# Patient Record
Sex: Female | Born: 1960 | Race: White | Hispanic: No | Marital: Married | Smoking: Former smoker
Health system: Southern US, Community
[De-identification: ages and names within clinical notes are randomized; demographics above are authoritative.]

## PROBLEM LIST (undated history)

## (undated) DIAGNOSIS — N898 Other specified noninflammatory disorders of vagina: Secondary | ICD-10-CM

## (undated) DIAGNOSIS — M1990 Unspecified osteoarthritis, unspecified site: Secondary | ICD-10-CM

## (undated) DIAGNOSIS — R011 Cardiac murmur, unspecified: Secondary | ICD-10-CM

## (undated) DIAGNOSIS — N809 Endometriosis, unspecified: Secondary | ICD-10-CM

## (undated) DIAGNOSIS — K219 Gastro-esophageal reflux disease without esophagitis: Secondary | ICD-10-CM

## (undated) DIAGNOSIS — J45909 Unspecified asthma, uncomplicated: Secondary | ICD-10-CM

## (undated) DIAGNOSIS — R5382 Chronic fatigue, unspecified: Secondary | ICD-10-CM

## (undated) DIAGNOSIS — D649 Anemia, unspecified: Secondary | ICD-10-CM

## (undated) DIAGNOSIS — F419 Anxiety disorder, unspecified: Secondary | ICD-10-CM

## (undated) DIAGNOSIS — K589 Irritable bowel syndrome without diarrhea: Secondary | ICD-10-CM

## (undated) DIAGNOSIS — Z8262 Family history of osteoporosis: Secondary | ICD-10-CM

## (undated) DIAGNOSIS — E119 Type 2 diabetes mellitus without complications: Secondary | ICD-10-CM

## (undated) DIAGNOSIS — Z87442 Personal history of urinary calculi: Secondary | ICD-10-CM

## (undated) DIAGNOSIS — I10 Essential (primary) hypertension: Secondary | ICD-10-CM

## (undated) DIAGNOSIS — E669 Obesity, unspecified: Secondary | ICD-10-CM

## (undated) DIAGNOSIS — E8940 Asymptomatic postprocedural ovarian failure: Secondary | ICD-10-CM

## (undated) HISTORY — DX: Essential (primary) hypertension: I10

## (undated) HISTORY — DX: Other specified noninflammatory disorders of vagina: N89.8

## (undated) HISTORY — DX: Family history of osteoporosis: Z82.62

## (undated) HISTORY — DX: Irritable bowel syndrome, unspecified: K58.9

## (undated) HISTORY — DX: Asymptomatic postprocedural ovarian failure: E89.40

## (undated) HISTORY — DX: Obesity, unspecified: E66.9

## (undated) HISTORY — PX: ABDOMINAL HYSTERECTOMY: SHX81

## (undated) HISTORY — DX: Chronic fatigue, unspecified: R53.82

## (undated) HISTORY — DX: Endometriosis, unspecified: N80.9

---
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
## 1998-08-07 ENCOUNTER — Other Ambulatory Visit: Admission: RE | Admit: 1998-08-07 | Discharge: 1998-08-07 | Payer: Self-pay | Admitting: *Deleted

## 1998-12-29 ENCOUNTER — Encounter: Payer: Self-pay | Admitting: Obstetrics and Gynecology

## 1998-12-29 ENCOUNTER — Inpatient Hospital Stay (HOSPITAL_COMMUNITY): Admission: AD | Admit: 1998-12-29 | Discharge: 1998-12-29 | Payer: Self-pay | Admitting: Obstetrics and Gynecology

## 1999-01-24 ENCOUNTER — Inpatient Hospital Stay (HOSPITAL_COMMUNITY): Admission: AD | Admit: 1999-01-24 | Discharge: 1999-01-26 | Payer: Self-pay | Admitting: Obstetrics and Gynecology

## 2003-08-30 ENCOUNTER — Other Ambulatory Visit: Admission: RE | Admit: 2003-08-30 | Discharge: 2003-08-30 | Payer: Self-pay | Admitting: Obstetrics and Gynecology

## 2005-02-26 ENCOUNTER — Ambulatory Visit: Payer: Self-pay | Admitting: Internal Medicine

## 2005-06-17 ENCOUNTER — Ambulatory Visit: Payer: Self-pay | Admitting: Obstetrics and Gynecology

## 2005-07-31 ENCOUNTER — Other Ambulatory Visit: Payer: Self-pay

## 2005-08-09 ENCOUNTER — Emergency Department: Payer: Self-pay | Admitting: General Practice

## 2005-08-09 ENCOUNTER — Inpatient Hospital Stay: Payer: Self-pay | Admitting: Obstetrics and Gynecology

## 2006-04-16 ENCOUNTER — Ambulatory Visit: Payer: Self-pay | Admitting: Obstetrics and Gynecology

## 2007-04-21 ENCOUNTER — Ambulatory Visit: Payer: Self-pay | Admitting: Obstetrics and Gynecology

## 2008-04-21 ENCOUNTER — Ambulatory Visit: Payer: Self-pay | Admitting: Obstetrics and Gynecology

## 2010-10-04 ENCOUNTER — Ambulatory Visit: Payer: Self-pay | Admitting: Obstetrics and Gynecology

## 2012-04-28 ENCOUNTER — Ambulatory Visit: Payer: Self-pay | Admitting: Obstetrics and Gynecology

## 2013-04-29 ENCOUNTER — Ambulatory Visit: Payer: Self-pay | Admitting: Obstetrics and Gynecology

## 2013-06-21 ENCOUNTER — Encounter: Payer: Self-pay | Admitting: Internal Medicine

## 2013-07-26 ENCOUNTER — Ambulatory Visit: Payer: Self-pay | Admitting: Internal Medicine

## 2013-09-07 ENCOUNTER — Ambulatory Visit: Payer: Self-pay | Admitting: Internal Medicine

## 2013-10-19 ENCOUNTER — Ambulatory Visit: Payer: Self-pay | Admitting: Internal Medicine

## 2013-11-30 ENCOUNTER — Ambulatory Visit: Payer: Self-pay | Admitting: Internal Medicine

## 2014-01-27 ENCOUNTER — Ambulatory Visit: Payer: Self-pay | Admitting: Internal Medicine

## 2014-07-13 LAB — HM PAP SMEAR: HM Pap smear: NEGATIVE

## 2014-08-09 ENCOUNTER — Ambulatory Visit: Payer: Self-pay | Admitting: Internal Medicine

## 2014-08-09 LAB — HM MAMMOGRAPHY

## 2014-09-28 DIAGNOSIS — M170 Bilateral primary osteoarthritis of knee: Secondary | ICD-10-CM | POA: Insufficient documentation

## 2015-04-06 ENCOUNTER — Telehealth: Payer: Self-pay | Admitting: *Deleted

## 2015-04-06 ENCOUNTER — Encounter: Payer: Self-pay | Admitting: Podiatry

## 2015-04-06 ENCOUNTER — Ambulatory Visit (INDEPENDENT_AMBULATORY_CARE_PROVIDER_SITE_OTHER): Payer: BC Managed Care – PPO

## 2015-04-06 ENCOUNTER — Ambulatory Visit (INDEPENDENT_AMBULATORY_CARE_PROVIDER_SITE_OTHER): Payer: BC Managed Care – PPO | Admitting: Podiatry

## 2015-04-06 VITALS — BP 153/88 | HR 63 | Resp 17

## 2015-04-06 DIAGNOSIS — M79672 Pain in left foot: Secondary | ICD-10-CM | POA: Diagnosis not present

## 2015-04-06 DIAGNOSIS — M722 Plantar fascial fibromatosis: Secondary | ICD-10-CM

## 2015-04-06 DIAGNOSIS — M779 Enthesopathy, unspecified: Secondary | ICD-10-CM | POA: Diagnosis not present

## 2015-04-06 NOTE — Telephone Encounter (Signed)
Pt has a question about her foot.

## 2015-04-06 NOTE — Telephone Encounter (Signed)
Patient stated she was seen in office already doesn't need to talk she was seen in the [place] office today

## 2015-04-06 NOTE — Progress Notes (Signed)
   Subjective:    Patient ID: Latasha [name], female    DOB: 08-Nov-1960, 54 y.o.   MRN: [id number]  HPI 54 year old female presents the office today with concerns of left foot pain which started this morning when she will cut. She states that the majority the bed last night she was having discomfort however this when she got up she had pain with straight up with her foot on the ground. She denies any history of injury or trauma. She does that yesterday she did do quite a bit of walking wearing a sandal. She did take Voltaren the swelling which seem to help alleviate some of her symptoms. She is also been soaking the foot in Epson salt soaks as well as icing today. She continues have some discomfort when she continues to walk. She denies any swelling or redness. Denies any numbness or tingling. No other complaints at this time.  Review of Systems  All other systems reviewed and are negative.      Objective:   Physical Exam AAO x3, NAD DP/PT pulses palpable bilaterally, CRT less than 3 seconds Protective sensation intact with Simms Weinstein monofilament, vibratory sensation intact, Achilles tendon reflex intact There is tenderness palpation along the plantar medial band of the plantar fascia just distal to the insertion of the calcaneus. There is mild discomfort along the medial arch of the foot as well. There is mild tenderness upon palpation of the navicular tuberosity. There is no tenderness on the course of the posterior tibial tendon other than at the tuberosity. Negative Tinel sign. There is no other areas of tenderness to bilateral lower extremities. There is no overall edema, erythema, increase in warmth. There is decrease in medial arch upon weightbearing. No areas of tenderness to bilateral lower extremities. MMT 5/5, ROM WNL.  No open lesions or pre-ulcerative lesions.  No overlying edema, erythema, increase in warmth to bilateral lower extremities.  No pain with calf compression,  swelling, warmth, erythema bilaterally.      Assessment & Plan:   54 year old female with likely recurrence of plantar fasciitis, tendinitis likely from yesterday's activity, no history of trauma/injury -X-rays were obtained and reviewed with the patient.  -Treatment options discussed including all alternatives, risks, and complications Patient elects to proceed with steroid injection into the left heel. Under sterile skin preparation, a total of 2.5cc of kenalog 10, 0.5% Marcaine plain, and 2% lidocaine plain were infiltrated into the symptomatic area without complication. A band-aid was applied. Patient tolerated the injection well without complication. Post-injection care with discussed with the patient. Discussed with the patient to ice the area over the next couple of days to help prevent a steroid flare.  -Dispensed ankle brace -Continue Voltaren -Discussed to wear supportive sneaker and all times. -Stretching exercises and ice -Follow-up 2 weeks or sooner if any problems arise. In the meantime, encouraged to call the office with any questions, concerns, change in symptoms.   Ovid Curd, DPM

## 2015-04-20 ENCOUNTER — Ambulatory Visit: Payer: BC Managed Care – PPO | Admitting: Podiatry

## 2015-07-18 ENCOUNTER — Encounter: Payer: Self-pay | Admitting: Obstetrics and Gynecology

## 2015-09-05 ENCOUNTER — Ambulatory Visit (INDEPENDENT_AMBULATORY_CARE_PROVIDER_SITE_OTHER): Payer: Self-pay | Admitting: Obstetrics and Gynecology

## 2015-09-05 ENCOUNTER — Encounter: Payer: Self-pay | Admitting: Obstetrics and Gynecology

## 2015-09-05 VITALS — BP 160/106 | HR 91 | Ht 66.0 in | Wt 231.0 lb

## 2015-09-05 DIAGNOSIS — K219 Gastro-esophageal reflux disease without esophagitis: Secondary | ICD-10-CM

## 2015-09-05 DIAGNOSIS — I10 Essential (primary) hypertension: Secondary | ICD-10-CM | POA: Insufficient documentation

## 2015-09-05 DIAGNOSIS — E66811 Obesity, class 1: Secondary | ICD-10-CM

## 2015-09-05 DIAGNOSIS — M810 Age-related osteoporosis without current pathological fracture: Secondary | ICD-10-CM | POA: Insufficient documentation

## 2015-09-05 DIAGNOSIS — Z1239 Encounter for other screening for malignant neoplasm of breast: Secondary | ICD-10-CM

## 2015-09-05 DIAGNOSIS — Z1211 Encounter for screening for malignant neoplasm of colon: Secondary | ICD-10-CM

## 2015-09-05 DIAGNOSIS — Z01419 Encounter for gynecological examination (general) (routine) without abnormal findings: Secondary | ICD-10-CM

## 2015-09-05 DIAGNOSIS — Z90722 Acquired absence of ovaries, bilateral: Secondary | ICD-10-CM

## 2015-09-05 DIAGNOSIS — Z90710 Acquired absence of both cervix and uterus: Secondary | ICD-10-CM

## 2015-09-05 DIAGNOSIS — E669 Obesity, unspecified: Secondary | ICD-10-CM

## 2015-09-05 DIAGNOSIS — E8940 Asymptomatic postprocedural ovarian failure: Secondary | ICD-10-CM | POA: Insufficient documentation

## 2015-09-05 DIAGNOSIS — Z9079 Acquired absence of other genital organ(s): Secondary | ICD-10-CM

## 2015-09-05 DIAGNOSIS — N809 Endometriosis, unspecified: Secondary | ICD-10-CM

## 2015-09-05 MED ORDER — ESTRADIOL 0.1 MG/GM VA CREA: 0.5000 g | TOPICAL_CREAM | VAGINAL | Status: DC

## 2015-09-05 NOTE — Progress Notes (Signed)
Patient ID: [name], female   DOB: March 02, 1961, 55 y.o.   MRN: [id number]

## 2015-09-05 NOTE — Patient Instructions (Signed)
1.  Pap smear is completed today. 2.  Mammogram is ordered. 3.  Stool guaiac card testing for colon cancer screen is ordered. 4.  Routine lab work is to be completed by Dr. Bethann PunchesMark Miller 5.  Follow-up with Dr. Bethann PunchesMark Miller for reassessment of blood pressure control soon. 6.  Encourage healthy eating with weight loss, with a goal of 1 pound per month weight loss over the next year. 7.  Estrace cream, intravaginal twice weekly for vaginal dryness is prescribed. 8.  Return 1 year.

## 2015-09-05 NOTE — Progress Notes (Signed)
Patient ID: Latasha ItoJulie Ann [name], female   DOB: Nov 18, 1960, 55 y.o.   MRN: [id number] GYN ANNUAL PREVENTATIVE CARE ENCOUNTER NOTE  Subjective:       Latasha ItoJulie Ann [name] is a 55 y.o. [phone] female here for a routine annual gynecologic exam.  Current complaints:  1.  Rare hot flash. 2.  Vaginal dryness. 3.  Loss of energy/fatigue. 4.  Weight gain.     Gynecologic History No LMP recorded. Contraception: status post TAH/BSO Last Pap: 07/13/2014 negative Last mammogram: 08/09/2014-BI-RADS 1 History of endometriosis, asymptomatic  Obstetric History OB History  Gravida Para Term Preterm AB SAB TAB Ectopic Multiple Living  2 2 2       2     # Outcome Date GA Lbr Len/2nd Weight Sex Delivery Anes PTL Lv  2 Term      Vag-Spont   Y  1 Term      Vag-Spont   Y      Past Medical History  Diagnosis Date  . Hypertension   . Obesity   . IBS (irritable bowel syndrome)   . Endometriosis   . Chronic fatigue   . Family history of osteoporosis   . Vaginal dryness   . Surgical menopause     Past Surgical History  Procedure Laterality Date  . Abdominal hysterectomy      tah/bso    Current Outpatient Prescriptions on File Prior to Visit  Medication Sig Dispense Refill  . acetaminophen (TYLENOL) 500 MG tablet Take by mouth.    . bisoprolol-hydrochlorothiazide (ZIAC) 10-6.25 MG per tablet TAKE ONE TABLET BY MOUTH ONCE DAILY    . diclofenac (VOLTAREN) 75 MG EC tablet Take by mouth.    . furosemide (LASIX) 20 MG tablet Take by mouth.    . Multiple Vitamin (MULTI-VITAMINS) TABS Take by mouth.    . pantoprazole (PROTONIX) 40 MG tablet TAKE ONE TABLET BY MOUTH ONCE DAILY IN THE MORNING AND IN THE EVENING    . potassium chloride SA (K-DUR,KLOR-CON) 20 MEQ tablet Take by mouth.     No current facility-administered medications on file prior to visit.    Allergies  Allergen Reactions  . Moxifloxacin Hcl In Nacl Rash    Does not remember - onset 01/15/2010    Social History   Social History  .  Marital Status: Married    Spouse Name: N/A  . Number of Children: N/A  . Years of Education: N/A   Occupational History  . Not on file.   Social History Main Topics  . Smoking status: Never Smoker   . Smokeless tobacco: Never Used  . Alcohol Use: 0.0 oz/week    0 Standard drinks or equivalent per week     Comment: occas  . Drug Use: No  . Sexual Activity: Yes    Birth Control/ Protection: Surgical   Other Topics Concern  . Not on file   Social History Narrative    Family History  Problem Relation Age of Onset  . Hypertension Mother   . Osteoporosis Mother   . Hypertension Father   . Diabetes Sister   . Hypertension Sister   . Diabetes Maternal Grandmother   . Hypertension Maternal Grandmother   . Cancer Neg Hx     The following portions of the patient's history were reviewed and updated as appropriate: allergies, current medications, past family history, past medical history, past social history, past surgical history and problem list.  Review of Systems Review of Systems  Constitutional: Positive for malaise/fatigue.  Rare hot flash  HENT: Negative.   Eyes: Negative.   Respiratory: Negative.   Cardiovascular: Negative.   Gastrointestinal: Negative.   Genitourinary: Negative.        Occasional SUI, not requiring pad use  Musculoskeletal: Negative.   Skin: Negative.   Neurological: Negative.   Endo/Heme/Allergies: Negative.   Psychiatric/Behavioral: Negative.      Objective:  BP 160/106 mmHg  Pulse 91  Ht 5\' 6"  (1.676 m)  Wt 231 lb (104.781 kg)  BMI 37.30 kg/m2  LMP  (LMP Unknown)  Physical Exam  Constitutional: She is oriented to person, place, and time. She appears well-developed and well-nourished.  HENT:  Head: Normocephalic and atraumatic.  Mouth/Throat: Oropharynx is clear and moist.  Eyes: Conjunctivae and EOM are normal. Pupils are equal, round, and reactive to light.  Neck: Normal range of motion. Neck supple.  Cardiovascular:  Normal rate and regular rhythm.   No murmur heard. Pulmonary/Chest: Breath sounds normal.  Abdominal: Soft. Bowel sounds are normal. She exhibits no distension and no mass. There is no tenderness. No hernia.  Genitourinary: No vaginal discharge found.  External genitalia-normal BUS-normal Vagina-decreased estrogen effect. Cervix-surgically absent Uterus-surgically absent Adnexa-no masses or tenderness. Rectovaginal-normal sphincter tone,; no rectal masses  Musculoskeletal: She exhibits no edema or tenderness.  Lymphadenopathy:    She has no cervical adenopathy.  Neurological: She is alert and oriented to person, place, and time.  Skin: Skin is warm and dry.  Psychiatric: She has a normal mood and affect. Her behavior is normal.    Assessment:   Annual gynecologic examination 55 y.o. Contraception: menopause  Obesity 1 Chronic hypertension, Blood pressure elevated (patient not taking medication last several days) Surgical menopause, asymptomatic GERD Vaginal dryness    Plan:  Pap: Pap Co Test Mammogram: Ordered Labs: Primary care-Dr. Bethann Punches Routine preventative health maintenance measures emphasized: Diet/Weight control, Tobacco Cessation and Alcohol/Drug use Encourage calcium with vitamin D supplementation Patient encouraged to lose 1 pound a month.  Over the next 12 months Follow-up with Dr. Hyacinth Meeker soon for reassessment of blood pressure control Return to Clinic - 1 Year   Herold Harms, MD   Note: This dictation was prepared with Dragon dictation along with smaller phrase technology. Any transcriptional errors that result from this process are unintentional.

## 2015-09-07 ENCOUNTER — Ambulatory Visit
Admission: RE | Admit: 2015-09-07 | Discharge: 2015-09-07 | Disposition: A | Discharge status: Home / Self Care | Payer: BC Managed Care – PPO | Source: Ambulatory Visit | Attending: Obstetrics and Gynecology | Admitting: Obstetrics and Gynecology

## 2015-09-07 DIAGNOSIS — Z1231 Encounter for screening mammogram for malignant neoplasm of breast: Secondary | ICD-10-CM | POA: Insufficient documentation

## 2015-09-07 DIAGNOSIS — Z1239 Encounter for other screening for malignant neoplasm of breast: Secondary | ICD-10-CM

## 2015-09-08 LAB — PAP IG AND HPV HIGH-RISK
HPV, HIGH-RISK: NEGATIVE
PAP Smear Comment: 0

## 2015-10-02 ENCOUNTER — Other Ambulatory Visit: Payer: Self-pay | Admitting: Internal Medicine

## 2015-10-02 DIAGNOSIS — M5010 Cervical disc disorder with radiculopathy, unspecified cervical region: Secondary | ICD-10-CM

## 2015-10-18 ENCOUNTER — Ambulatory Visit: Payer: BC Managed Care – PPO

## 2016-03-01 DIAGNOSIS — I10 Essential (primary) hypertension: Secondary | ICD-10-CM | POA: Insufficient documentation

## 2016-03-01 DIAGNOSIS — B029 Zoster without complications: Secondary | ICD-10-CM | POA: Insufficient documentation

## 2016-09-09 NOTE — Progress Notes (Deleted)
Patient ID: Latasha [name], female   DOB: 1960/12/10, 56 y.o.   MRN: [id number] GYN ANNUAL PREVENTATIVE CARE ENCOUNTER NOTE  Subjective:       Latasha [name] is a 56 y.o. [phone] female here for a routine annual gynecologic exam.  Current complaints:    Gynecologic History No LMP recorded (lmp unknown). Patient has had a hysterectomy. Contraception: status post TAH/BSO Last Pap: 09/05/2015 neg/neg Last mammogram: 09/07/2015 birad 1 History of endometriosis, asymptomatic  Obstetric History OB History  Gravida Para Term Preterm AB Living  2 2 2     2   SAB TAB Ectopic Multiple Live Births          2    # Outcome Date GA Lbr Len/2nd Weight Sex Delivery Anes PTL Lv  2 Term      Vag-Spont   LIV  1 Term      Vag-Spont   LIV      Past Medical History:  Diagnosis Date  . Chronic fatigue   . Endometriosis   . Family history of osteoporosis   . Hypertension   . IBS (irritable bowel syndrome)   . Obesity   . Surgical menopause   . Vaginal dryness     Past Surgical History:  Procedure Laterality Date  . ABDOMINAL HYSTERECTOMY     tah/bso    Current Outpatient Prescriptions on File Prior to Visit  Medication Sig Dispense Refill  . acetaminophen (TYLENOL) 500 MG tablet Take by mouth.    . Ascorbic Acid (VITAMIN C) 1000 MG tablet Take 1,000 mg by mouth daily.    . bisoprolol-hydrochlorothiazide (ZIAC) 10-6.25 MG per tablet TAKE ONE TABLET BY MOUTH ONCE DAILY    . diclofenac (VOLTAREN) 75 MG EC tablet Take by mouth.    . estradiol (ESTRACE VAGINAL) 0.1 MG/GM vaginal cream Place 0.25 Applicatorfuls vaginally 3 (three) times a week. 42.5 g 12  . furosemide (LASIX) 20 MG tablet Take by mouth.    . Glucosamine 500 MG CAPS Take 1 capsule by mouth daily.    . Multiple Vitamin (MULTI-VITAMINS) TABS Take by mouth.    . pantoprazole (PROTONIX) 40 MG tablet TAKE ONE TABLET BY MOUTH ONCE DAILY IN THE MORNING AND IN THE EVENING    . potassium chloride SA (K-DUR,KLOR-CON) 20 MEQ tablet Take  by mouth.     No current facility-administered medications on file prior to visit.     Allergies  Allergen Reactions  . Moxifloxacin Hcl In Nacl Rash    Does not remember - onset 01/15/2010    Social History   Social History  . Marital status: Married    Spouse name: N/A  . Number of children: N/A  . Years of education: N/A   Occupational History  . Not on file.   Social History Main Topics  . Smoking status: Never Smoker  . Smokeless tobacco: Never Used  . Alcohol use 0.0 oz/week     Comment: occas  . Drug use: No  . Sexual activity: Yes    Birth control/ protection: Surgical   Other Topics Concern  . Not on file   Social History Narrative  . No narrative on file    Family History  Problem Relation Age of Onset  . Hypertension Mother   . Osteoporosis Mother   . Hypertension Father   . Diabetes Sister   . Hypertension Sister   . Diabetes Maternal Grandmother   . Hypertension Maternal Grandmother   . Cancer Neg Hx   .  Breast cancer Maternal Aunt     great aunt    The following portions of the patient's history were reviewed and updated as appropriate: allergies, current medications, past family history, past medical history, past social history, past surgical history and problem list.  Review of Systems Review of Systems  Constitutional: Positive for malaise/fatigue.       Rare hot flash  HENT: Negative.   Eyes: Negative.   Respiratory: Negative.   Cardiovascular: Negative.   Gastrointestinal: Negative.   Genitourinary: Negative.        Occasional SUI, not requiring pad use  Musculoskeletal: Negative.   Skin: Negative.   Neurological: Negative.   Endo/Heme/Allergies: Negative.   Psychiatric/Behavioral: Negative.      Objective:  LMP  (LMP Unknown)   Physical Exam  Constitutional: She is oriented to person, place, and time. She appears well-developed and well-nourished.  HENT:  Head: Normocephalic and atraumatic.  Mouth/Throat: Oropharynx is  clear and moist.  Eyes: Conjunctivae and EOM are normal. Pupils are equal, round, and reactive to light.  Neck: Normal range of motion. Neck supple.  Cardiovascular: Normal rate and regular rhythm.   No murmur heard. Pulmonary/Chest: Breath sounds normal.  Abdominal: Soft. Bowel sounds are normal. She exhibits no distension and no mass. There is no tenderness. No hernia.  Genitourinary: No vaginal discharge found.  Genitourinary Comments: External genitalia-normal BUS-normal Vagina-decreased estrogen effect. Cervix-surgically absent Uterus-surgically absent Adnexa-no masses or tenderness. Rectovaginal-normal sphincter tone,; no rectal masses  Musculoskeletal: She exhibits no edema or tenderness.  Lymphadenopathy:    She has no cervical adenopathy.  Neurological: She is alert and oriented to person, place, and time.  Skin: Skin is warm and dry.  Psychiatric: She has a normal mood and affect. Her behavior is normal.    Assessment:   Annual gynecologic examination 56 y.o. Contraception: menopause  Obesity 1 Chronic hypertension, Blood pressure elevated (patient not taking medication last several days) Surgical menopause, asymptomatic GERD Vaginal dryness    Plan:  Pap: Due 2020 Mammogram: Ordered Labs: Primary care-Dr. Bethann PunchesMark Jequan Shahin Routine preventative health maintenance measures emphasized: Diet/Weight control, Tobacco Cessation and Alcohol/Drug use Encourage calcium with vitamin D supplementation Patient encouraged to lose 1 pound a month.  Over the next 12 months Follow-up with Dr. Hyacinth MeekerMiller soon for reassessment of blood pressure control Return to Clinic - 1 Year   Latasha Destinerystal Charnice Zwilling, [place]   Note: This dictation was prepared with Dragon dictation along with smaller phrase technology. Any transcriptional errors that result from this process are unintentional.

## 2016-09-10 ENCOUNTER — Encounter: Payer: Self-pay | Admitting: Obstetrics and Gynecology

## 2016-10-08 ENCOUNTER — Encounter: Payer: BC Managed Care – PPO | Admitting: Obstetrics and Gynecology

## 2016-11-01 NOTE — Progress Notes (Deleted)
Patient ID: Latasha [name], female   DOB: 08-10-1961, 56 y.o.   MRN: [id number] GYN ANNUAL PREVENTATIVE CARE ENCOUNTER NOTE  Subjective:       Latasha [name] is a 56 y.o. [phone] female here for a routine annual gynecologic exam.  Current complaints:  1.    Gynecologic History No LMP recorded (lmp unknown). Patient has had a hysterectomy. Contraception: status post TAH/BSO Last Pap: 09/05/2015 negative Last mammogram: 09/08/2015-BI-RADS 1 History of endometriosis, asymptomatic  Obstetric History OB History  Gravida Para Term Preterm AB Living  2 2 2     2   SAB TAB Ectopic Multiple Live Births          2    # Outcome Date GA Lbr Len/2nd Weight Sex Delivery Anes PTL Lv  2 Term      Vag-Spont   LIV  1 Term      Vag-Spont   LIV      Past Medical History:  Diagnosis Date  . Chronic fatigue   . Endometriosis   . Family history of osteoporosis   . Hypertension   . IBS (irritable bowel syndrome)   . Obesity   . Surgical menopause   . Vaginal dryness     Past Surgical History:  Procedure Laterality Date  . ABDOMINAL HYSTERECTOMY     tah/bso    Current Outpatient Prescriptions on File Prior to Visit  Medication Sig Dispense Refill  . acetaminophen (TYLENOL) 500 MG tablet Take by mouth.    . Ascorbic Acid (VITAMIN C) 1000 MG tablet Take 1,000 mg by mouth daily.    . bisoprolol-hydrochlorothiazide (ZIAC) 10-6.25 MG per tablet TAKE ONE TABLET BY MOUTH ONCE DAILY    . diclofenac (VOLTAREN) 75 MG EC tablet Take by mouth.    . estradiol (ESTRACE VAGINAL) 0.1 MG/GM vaginal cream Place 0.25 Applicatorfuls vaginally 3 (three) times a week. 42.5 g 12  . furosemide (LASIX) 20 MG tablet Take by mouth.    . Glucosamine 500 MG CAPS Take 1 capsule by mouth daily.    . Multiple Vitamin (MULTI-VITAMINS) TABS Take by mouth.    . pantoprazole (PROTONIX) 40 MG tablet TAKE ONE TABLET BY MOUTH ONCE DAILY IN THE MORNING AND IN THE EVENING    . potassium chloride SA (K-DUR,KLOR-CON) 20 MEQ  tablet Take by mouth.     No current facility-administered medications on file prior to visit.     Allergies  Allergen Reactions  . Moxifloxacin Hcl In Nacl Rash    Does not remember - onset 01/15/2010    Social History   Social History  . Marital status: Married    Spouse name: N/A  . Number of children: N/A  . Years of education: N/A   Occupational History  . Not on file.   Social History Main Topics  . Smoking status: Never Smoker  . Smokeless tobacco: Never Used  . Alcohol use 0.0 oz/week     Comment: occas  . Drug use: No  . Sexual activity: Yes    Birth control/ protection: Surgical   Other Topics Concern  . Not on file   Social History Narrative  . No narrative on file    Family History  Problem Relation Age of Onset  . Hypertension Mother   . Osteoporosis Mother   . Hypertension Father   . Diabetes Sister   . Hypertension Sister   . Diabetes Maternal Grandmother   . Hypertension Maternal Grandmother   . Cancer Neg Hx   .  Breast cancer Maternal Aunt     great aunt    The following portions of the patient's history were reviewed and updated as appropriate: allergies, current medications, past family history, past medical history, past social history, past surgical history and problem list.  Review of Systems Review of Systems  Constitutional: Positive for malaise/fatigue.       Rare hot flash  HENT: Negative.   Eyes: Negative.   Respiratory: Negative.   Cardiovascular: Negative.   Gastrointestinal: Negative.   Genitourinary: Negative.        Occasional SUI, not requiring pad use  Musculoskeletal: Negative.   Skin: Negative.   Neurological: Negative.   Endo/Heme/Allergies: Negative.   Psychiatric/Behavioral: Negative.      Objective:  LMP  (LMP Unknown)   Physical Exam  Constitutional: She is oriented to person, place, and time. She appears well-developed and well-nourished.  HENT:  Head: Normocephalic and atraumatic.  Mouth/Throat:  Oropharynx is clear and moist.  Eyes: Conjunctivae and EOM are normal. Pupils are equal, round, and reactive to light.  Neck: Normal range of motion. Neck supple.  Cardiovascular: Normal rate and regular rhythm.   No murmur heard. Pulmonary/Chest: Breath sounds normal.  Abdominal: Soft. Bowel sounds are normal. She exhibits no distension and no mass. There is no tenderness. No hernia.  Genitourinary: No vaginal discharge found.  Genitourinary Comments: External genitalia-normal BUS-normal Vagina-decreased estrogen effect. Cervix-surgically absent Uterus-surgically absent Adnexa-no masses or tenderness. Rectovaginal-normal sphincter tone,; no rectal masses  Musculoskeletal: She exhibits no edema or tenderness.  Lymphadenopathy:    She has no cervical adenopathy.  Neurological: She is alert and oriented to person, place, and time.  Skin: Skin is warm and dry.  Psychiatric: She has a normal mood and affect. Her behavior is normal.    Assessment:   Annual gynecologic examination 56 y.o. Contraception: menopause  Obesity 1 Chronic hypertension, Blood pressure elevated (patient not taking medication last several days) Surgical menopause, asymptomatic GERD Vaginal dryness    Plan:  Pap: Due 2020 Mammogram: Ordered Labs: Primary care-Dr. Bethann Punches Routine preventative health maintenance measures emphasized: Diet/Weight control, Tobacco Cessation and Alcohol/Drug use Encourage calcium with vitamin D supplementation Patient encouraged to lose 1 pound a month.  Over the next 12 months Return to Clinic - 1 Year   Darol Destine, [place]   Note: This dictation was prepared with Dragon dictation along with smaller phrase technology. Any transcriptional errors that result from this process are unintentional.

## 2016-11-07 ENCOUNTER — Encounter: Payer: BC Managed Care – PPO | Admitting: Obstetrics and Gynecology

## 2016-11-28 IMAGING — MG MM DIGITAL SCREENING BILAT W/ TOMO W/ CAD
8 of 12 series · 8 of 28 positions shown · non-contrast
Comparison: Previous exam(s).

CLINICAL DATA: Screening.

EXAM:
DIGITAL SCREENING BILATERAL MAMMOGRAM WITH 3D TOMO WITH CAD

[L CC synth-2D]
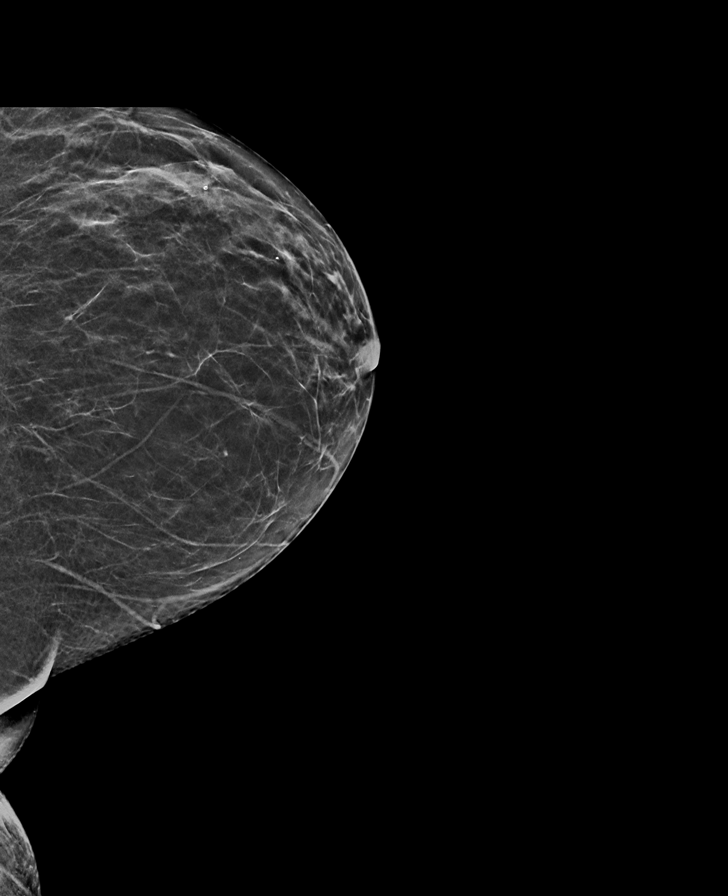

[R CC]
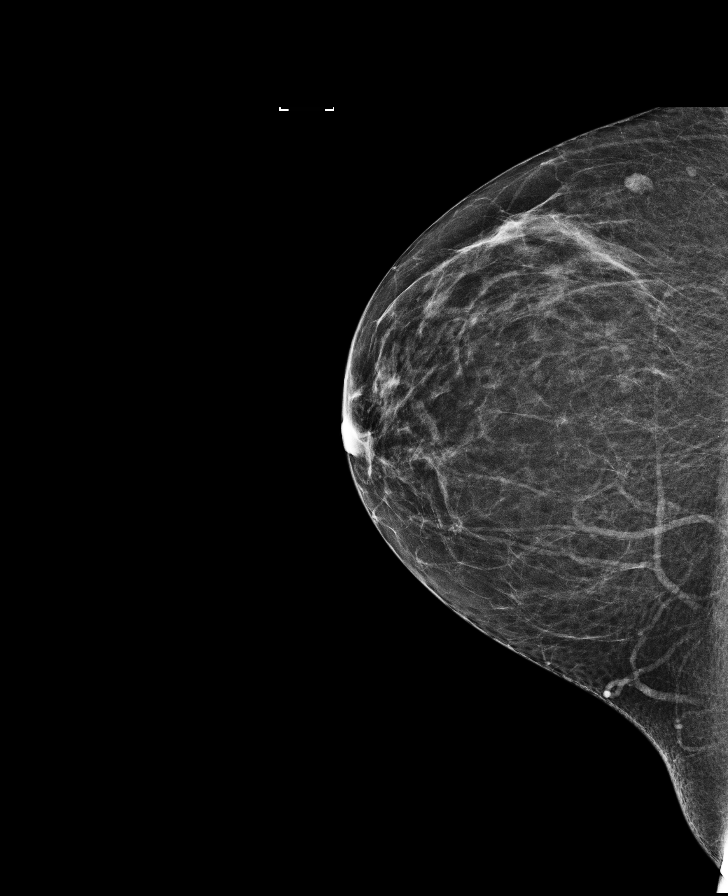

[R CC synth-2D]
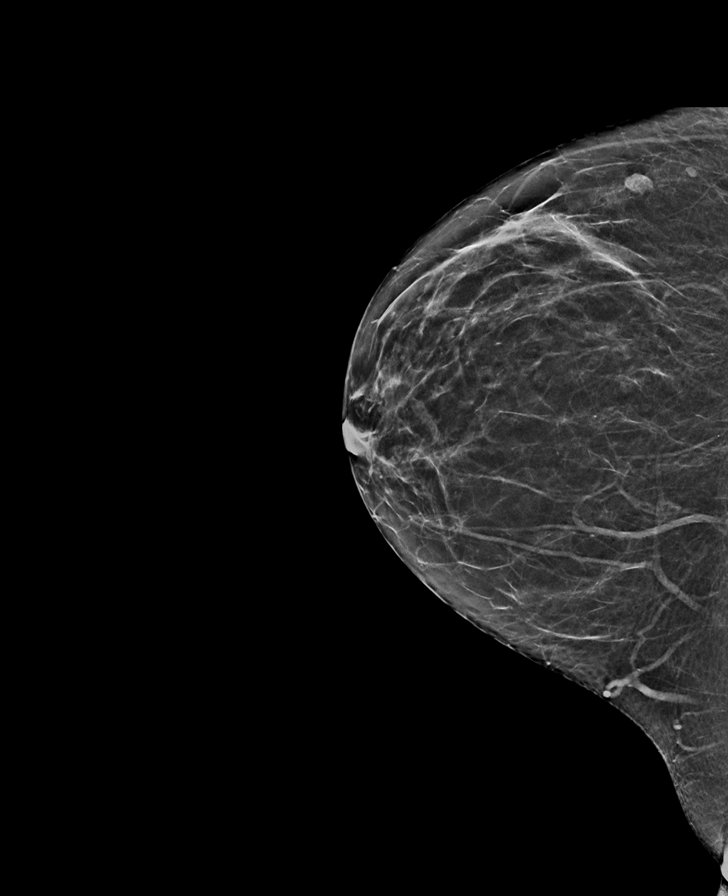

[L CC]
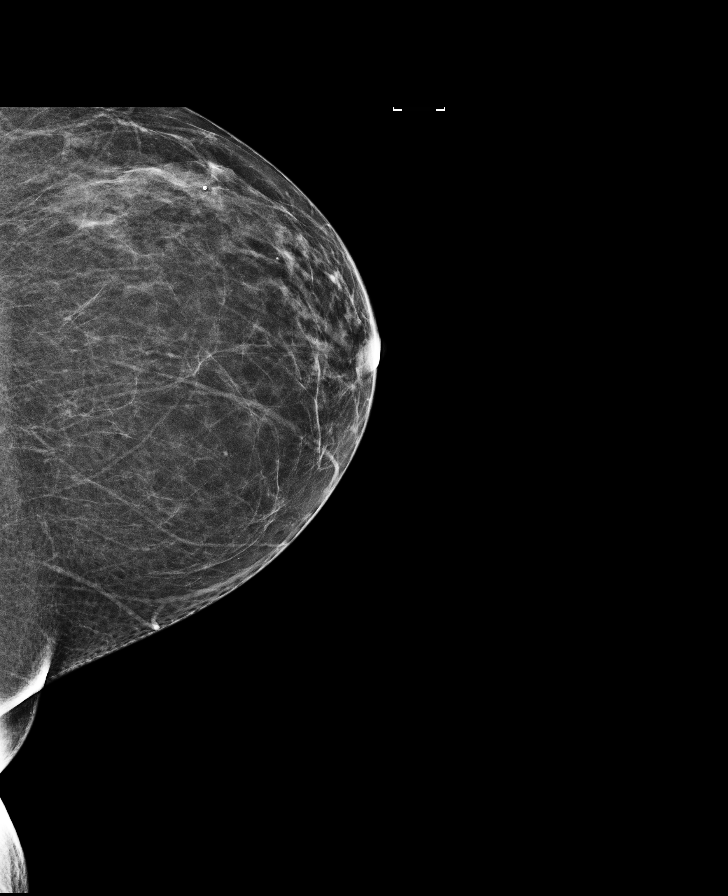

[L MLO synth-2D]
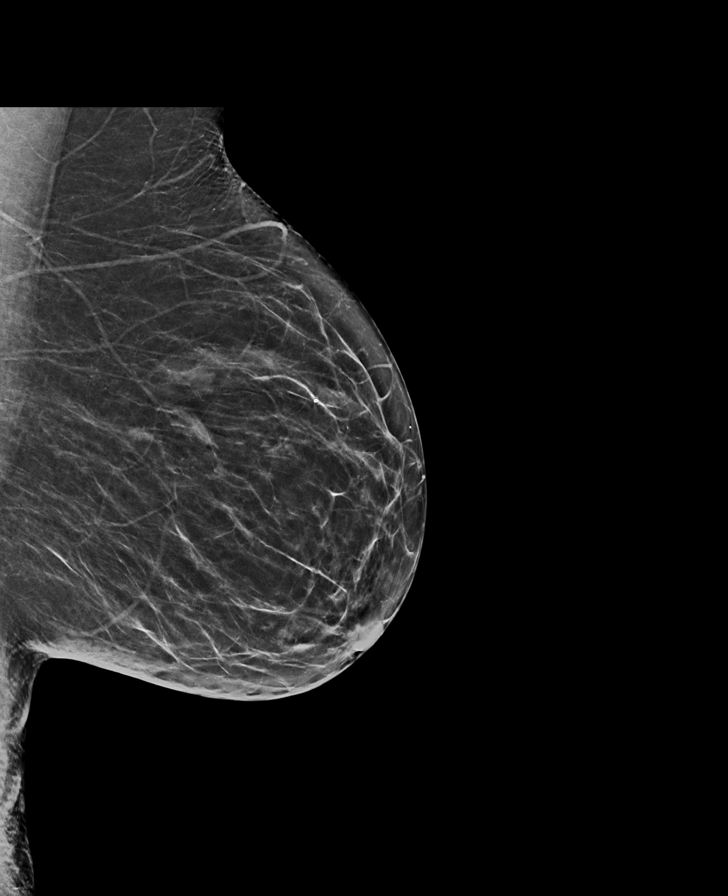

[L MLO]
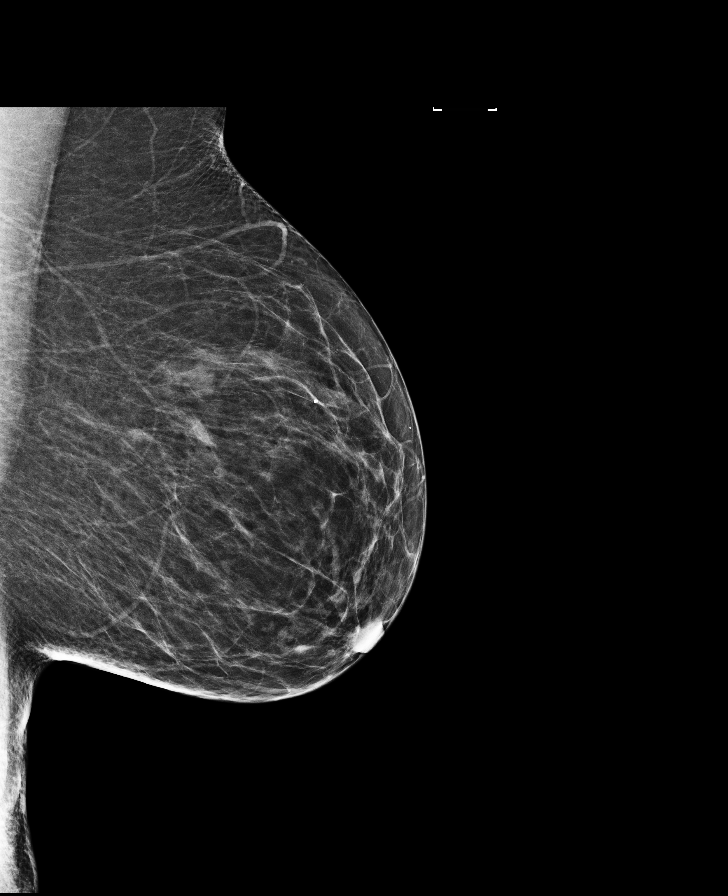

[R MLO synth-2D]
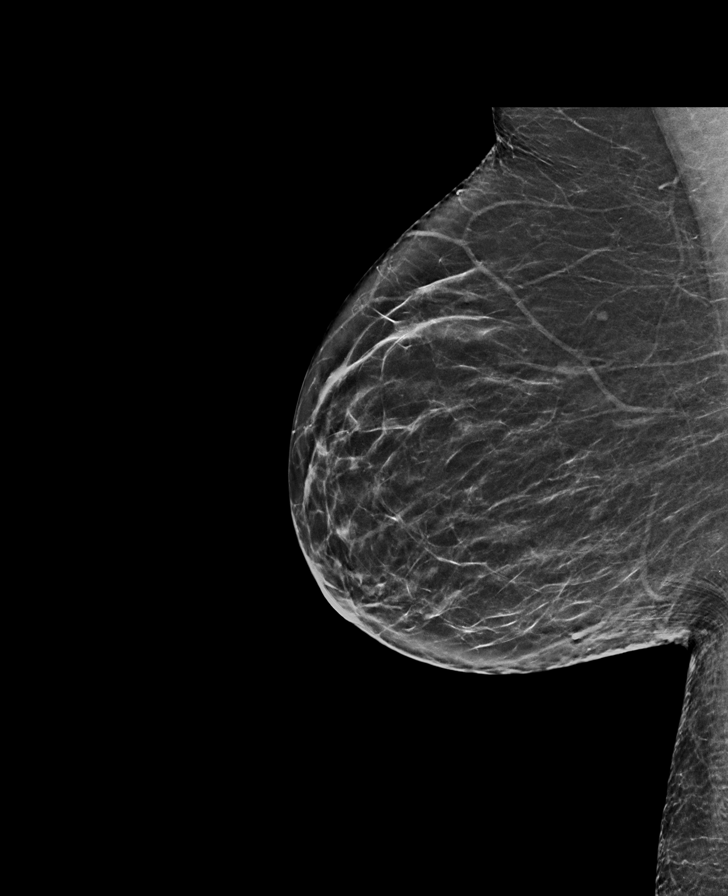

[R MLO]
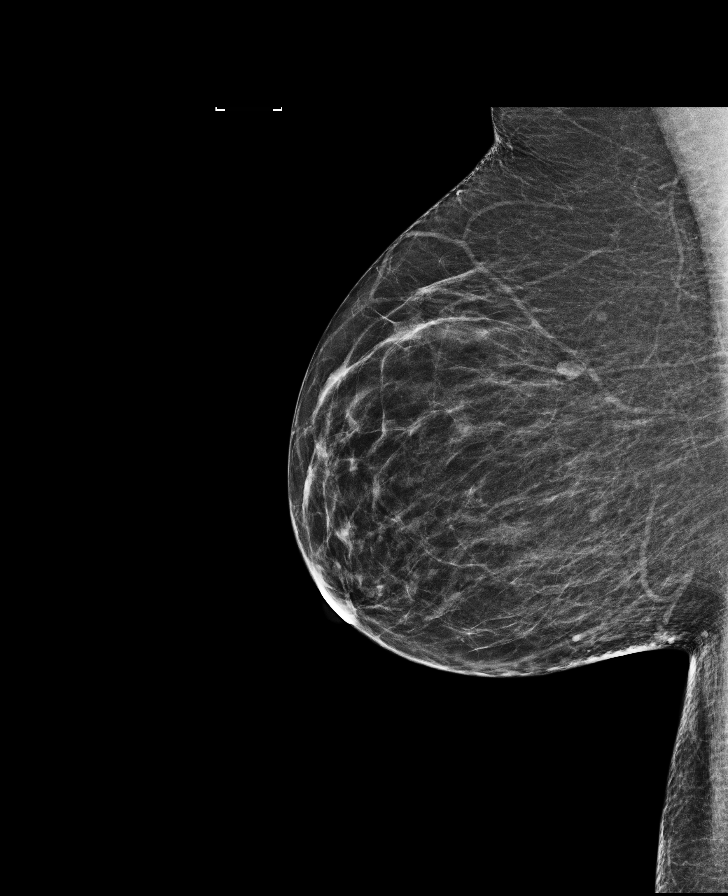

[8 of 28 positions shown; findings below may reference images not displayed]

ACR Breast Density Category b: There are scattered areas of
fibroglandular density.
FINDINGS: There are no findings suspicious for malignancy. Images were
processed with CAD.
IMPRESSION: No mammographic evidence of malignancy. A result letter of this
screening mammogram will be mailed directly to the patient.

RECOMMENDATION:
Screening mammogram in one year. (Code:55-L-23V)

BI-RADS CATEGORY  1: Negative.

## 2016-12-16 NOTE — Progress Notes (Signed)
Patient ID: Latasha [name], female   DOB: 06/20/1961, 56 y.o.   MRN: [id number]   Patient ID: Latasha [name], female   DOB: 1961-01-21, 56 y.o.   MRN: [id number] GYN ANNUAL PREVENTATIVE CARE ENCOUNTER NOTE  Subjective:       Latasha [name] is a 56 y.o. [phone] female here for a routine annual gynecologic exam.  Current complaints:  1.  none  Patient reports no major interval health issues. Bowel function is normal. Bladder function is notable for mild stress and urge symptoms; she is not desiring medical intervention Patient is not experiencing any significant vaginal atrophy symptoms; intercourse is not uncomfortable. Latasha [name] is not taking calcium with vitamin D. She is not exercising regularly due to arthritis knee problems. She is due to get colonoscopy this year.   Gynecologic History No LMP recorded (lmp unknown). Patient has had a hysterectomy. Contraception: status post TAH/BSO Last Pap: 09/05/2015  Negative/neg Last mammogram: 09/08/2015-BI-RADS 1 History of endometriosis, asymptomatic  Obstetric History OB History  Gravida Para Term Preterm AB Living  SAB TAB Ectopic Multiple Live Births          2    # Outcome Date GA Lbr Len/2nd Weight Sex Delivery Anes PTL Lv  2 Term      Vag-Spont   LIV  1 Term      Vag-Spont   LIV      Past Medical History:  Diagnosis Date  . Chronic fatigue   . Endometriosis   . Family history of osteoporosis   . Hypertension   . IBS (irritable bowel syndrome)   . Obesity   . Surgical menopause   . Vaginal dryness     Past Surgical History:  Procedure Laterality Date  . ABDOMINAL HYSTERECTOMY     tah/bso    Current Outpatient Prescriptions on File Prior to Visit  Medication Sig Dispense Refill  . acetaminophen (TYLENOL) 500 MG tablet Take by mouth.    . Ascorbic Acid (VITAMIN C) 1000 MG tablet Take 1,000 mg by mouth daily.    . bisoprolol-hydrochlorothiazide (ZIAC) 10-6.25 MG per tablet TAKE ONE TABLET BY MOUTH  ONCE DAILY    . diclofenac (VOLTAREN) 75 MG EC tablet Take by mouth.    . estradiol (ESTRACE VAGINAL) 0.1 MG/GM vaginal cream Place 0.25 Applicatorfuls vaginally 3 (three) times a week. 42.5 g 12  . furosemide (LASIX) 20 MG tablet Take by mouth.    . Glucosamine 500 MG CAPS Take 1 capsule by mouth daily.    . Multiple Vitamin (MULTI-VITAMINS) TABS Take by mouth.    . pantoprazole (PROTONIX) 40 MG tablet TAKE ONE TABLET BY MOUTH ONCE DAILY IN THE MORNING AND IN THE EVENING    . potassium chloride SA (K-DUR,KLOR-CON) 20 MEQ tablet Take by mouth.     No current facility-administered medications on file prior to visit.     Allergies  Allergen Reactions  . Moxifloxacin Hcl In Nacl Rash    Does not remember - onset 01/15/2010    Social History   Social History  . Marital status: Married    Spouse name: N/A  . Number of children: N/A  . Years of education: N/A   Occupational History  . Not on file.   Social History Main Topics  . Smoking status: Never Smoker  . Smokeless tobacco: Never Used  . Alcohol use 0.0 oz/week     Comment: occas  . Drug use: No  .  Sexual activity: Yes    Birth control/ protection: Surgical   Other Topics Concern  . Not on file   Social History Narrative  . No narrative on file    Family History  Problem Relation Age of Onset  . Hypertension Mother   . Osteoporosis Mother   . Hypertension Father   . Diabetes Sister   . Hypertension Sister   . Diabetes Maternal Grandmother   . Hypertension Maternal Grandmother   . Cancer Neg Hx   . Breast cancer Maternal Aunt     great aunt    The following portions of the patient's history were reviewed and updated as appropriate: allergies, current medications, past family history, past medical history, past social history, past surgical history and problem list.  Review of Systems Review of Systems  Constitutional: Positive for malaise/fatigue.       Rare hot flash  HENT: Negative.   Eyes: Negative.    Respiratory: Negative.   Cardiovascular: Negative.   Gastrointestinal: Negative.   Genitourinary: Negative.        Occasional SUI, not requiring pad use Urge symptoms are present  Musculoskeletal: Positive for joint pain.       Knee arthritis issues are preventing regular exercise  Skin: Negative.   Neurological: Negative.   Endo/Heme/Allergies: Negative.   Psychiatric/Behavioral: Negative.      Objective:  No LMP recorded (lmp unknown). Patient has had a hysterectomy. BP (!) 144/81   Pulse 80   Ht  (1.676 m)   Wt 219 lb 8 oz (99.6 kg)   LMP  (LMP Unknown)   BMI 35.43 kg/m    Physical Exam  Constitutional: She is oriented to person, place, and time. She appears well-developed and well-nourished.  HENT:  Head: Normocephalic and atraumatic.  Mouth/Throat: Oropharynx is clear and moist.  Eyes: Conjunctivae and EOM are normal. Pupils are equal, round, and reactive to light. No scleral icterus.  Neck: Normal range of motion. Neck supple. No thyromegaly present.  Cardiovascular: Normal rate and regular rhythm.   No murmur heard. Pulmonary/Chest: Breath sounds normal.  Abdominal: Soft. She exhibits no distension and no mass. There is no tenderness. No hernia.  Genitourinary: Vagina normal. No vaginal discharge found.  Genitourinary Comments: External genitalia-normal BUS-normal Vagina-decreased estrogen effect. Cervix-surgically absent Uterus-surgically absent Adnexa-no masses or tenderness. Rectovaginal-normal sphincter tone,; no rectal masses  Musculoskeletal: She exhibits no edema or tenderness.  Lymphadenopathy:    She has no cervical adenopathy.  Neurological: She is alert and oriented to person, place, and time.  Skin: Skin is warm and dry.  Psychiatric: She has a normal mood and affect. Her behavior is normal.    Assessment:   Annual gynecologic examination 56 y.o. Contraception: menopause  Obesity 1 Chronic hypertension, Surgical menopause,  asymptomatic GERD    Plan:  Pap:per pt request pap w/rflx Mammogram: Ordered Colonoscopy for 03/2017 Labs: Primary care-Dr. Bethann Punches Routine preventative health maintenance measures emphasized: Diet/Weight control, Tobacco Cessation and Alcohol/Drug use Encourage calcium with vitamin D supplementation Patient encouraged to lose 1 pound a month.  Over the next 12 months Return to Clinic - 1 [address] [place], [place]  Herold Harms, MD   Note: This dictation was prepared with Dragon dictation along with smaller phrase technology. Any transcriptional errors that result from this process are unintentional.

## 2016-12-18 ENCOUNTER — Ambulatory Visit (INDEPENDENT_AMBULATORY_CARE_PROVIDER_SITE_OTHER): Payer: BC Managed Care – PPO | Admitting: Obstetrics and Gynecology

## 2016-12-18 ENCOUNTER — Encounter: Payer: Self-pay | Admitting: Obstetrics and Gynecology

## 2016-12-18 VITALS — BP 144/81 | HR 80 | Ht 66.0 in | Wt 219.5 lb

## 2016-12-18 DIAGNOSIS — Z1211 Encounter for screening for malignant neoplasm of colon: Secondary | ICD-10-CM | POA: Diagnosis not present

## 2016-12-18 DIAGNOSIS — Z1231 Encounter for screening mammogram for malignant neoplasm of breast: Secondary | ICD-10-CM | POA: Diagnosis not present

## 2016-12-18 DIAGNOSIS — Z1239 Encounter for other screening for malignant neoplasm of breast: Secondary | ICD-10-CM

## 2016-12-18 DIAGNOSIS — Z01419 Encounter for gynecological examination (general) (routine) without abnormal findings: Secondary | ICD-10-CM | POA: Diagnosis not present

## 2016-12-18 DIAGNOSIS — N809 Endometriosis, unspecified: Secondary | ICD-10-CM | POA: Diagnosis not present

## 2016-12-18 NOTE — Patient Instructions (Signed)
1. Pap smear is done 2. Mammogram is ordered 3. Patient is to obtain screening colonoscopy this year 4. Patient is encouraged to take calcium with vitamin D supplementation daily 5. Screening labs are to be obtained through primary care 6. Return in 1 year for annual exam   Health Maintenance for Postmenopausal Women Menopause is a normal process in which your reproductive ability comes to an end. This process happens gradually over a span of months to years, usually between the ages of 11 and 24. Menopause is complete when you have missed 12 consecutive menstrual periods. It is important to talk with your health care provider about some of the most common conditions that affect postmenopausal women, such as heart disease, cancer, and bone loss (osteoporosis). Adopting a healthy lifestyle and getting preventive care can help to promote your health and wellness. Those actions can also lower your chances of developing some of these common conditions. What should I know about menopause? During menopause, you may experience a number of symptoms, such as:  Moderate-to-severe hot flashes.  Night sweats.  Decrease in sex drive.  Mood swings.  Headaches.  Tiredness.  Irritability.  Memory problems.  Insomnia. Choosing to treat or not to treat menopausal changes is an individual decision that you make with your health care provider. What should I know about hormone replacement therapy and supplements? Hormone therapy products are effective for treating symptoms that are associated with menopause, such as hot flashes and night sweats. Hormone replacement carries certain risks, especially as you become older. If you are thinking about using estrogen or estrogen with progestin treatments, discuss the benefits and risks with your health care provider. What should I know about heart disease and stroke? Heart disease, heart attack, and stroke become more likely as you age. This may be due, in  part, to the hormonal changes that your body experiences during menopause. These can affect how your body processes dietary fats, triglycerides, and cholesterol. Heart attack and stroke are both medical emergencies. There are many things that you can do to help prevent heart disease and stroke:  Have your blood pressure checked at least every 1-2 years. High blood pressure causes heart disease and increases the risk of stroke.  If you are 45-61 years old, ask your health care provider if you should take aspirin to prevent a heart attack or a stroke.  Do not use any tobacco products, including cigarettes, chewing tobacco, or electronic cigarettes. If you need help quitting, ask your health care provider.  It is important to eat a healthy diet and maintain a healthy weight.  Be sure to include plenty of vegetables, fruits, low-fat dairy products, and lean protein.  Avoid eating foods that are high in solid fats, added sugars, or salt (sodium).  Get regular exercise. This is one of the most important things that you can do for your health.  Try to exercise for at least 150 minutes each week. The type of exercise that you do should increase your heart rate and make you sweat. This is known as moderate-intensity exercise.  Try to do strengthening exercises at least twice each week. Do these in addition to the moderate-intensity exercise.  Know your numbers.Ask your health care provider to check your cholesterol and your blood glucose. Continue to have your blood tested as directed by your health care provider. What should I know about cancer screening? There are several types of cancer. Take the following steps to reduce your risk and to catch any cancer  development as early as possible. Breast Cancer  Practice breast self-awareness.  This means understanding how your breasts normally appear and feel.  It also means doing regular breast self-exams. Let your health care provider know about  any changes, no matter how small.  If you are 49 or older, have a clinician do a breast exam (clinical breast exam or CBE) every year. Depending on your age, family history, and medical history, it may be recommended that you also have a yearly breast X-ray (mammogram).  If you have a family history of breast cancer, talk with your health care provider about genetic screening.  If you are at high risk for breast cancer, talk with your health care provider about having an MRI and a mammogram every year.  Breast cancer (BRCA) gene test is recommended for women who have family members with BRCA-related cancers. Results of the assessment will determine the need for genetic counseling and BRCA1 and for BRCA2 testing. BRCA-related cancers include these types:  Breast. This occurs in males or females.  Ovarian.  Tubal. This may also be called fallopian tube cancer.  Cancer of the abdominal or pelvic lining (peritoneal cancer).  Prostate.  Pancreatic. Cervical, Uterine, and Ovarian Cancer  Your health care provider may recommend that you be screened regularly for cancer of the pelvic organs. These include your ovaries, uterus, and vagina. This screening involves a pelvic exam, which includes checking for microscopic changes to the surface of your cervix (Pap test).  For women ages 21-65, health care providers may recommend a pelvic exam and a Pap test every three years. For women ages 23-65, they may recommend the Pap test and pelvic exam, combined with testing for human papilloma virus (HPV), every five years. Some types of HPV increase your risk of cervical cancer. Testing for HPV may also be done on women of any age who have unclear Pap test results.  Other health care providers may not recommend any screening for nonpregnant women who are considered low risk for pelvic cancer and have no symptoms. Ask your health care provider if a screening pelvic exam is right for you.  If you have had past  treatment for cervical cancer or a condition that could lead to cancer, you need Pap tests and screening for cancer for at least 20 years after your treatment. If Pap tests have been discontinued for you, your risk factors (such as having a new sexual partner) need to be reassessed to determine if you should start having screenings again. Some women have medical problems that increase the chance of getting cervical cancer. In these cases, your health care provider may recommend that you have screening and Pap tests more often.  If you have a family history of uterine cancer or ovarian cancer, talk with your health care provider about genetic screening.  If you have vaginal bleeding after reaching menopause, tell your health care provider.  There are currently no reliable tests available to screen for ovarian cancer. Lung Cancer  Lung cancer screening is recommended for adults 62-22 years old who are at high risk for lung cancer because of a history of smoking. A yearly low-dose CT scan of the lungs is recommended if you:  Currently smoke.  Have a history of at least 30 pack-years of smoking and you currently smoke or have quit within the past 15 years. A pack-year is smoking an average of one pack of cigarettes per day for one year. Yearly screening should:  Continue until it has  been 15 years since you quit.  Stop if you develop a health problem that would prevent you from having lung cancer treatment. Colorectal Cancer  This type of cancer can be detected and can often be prevented.  Routine colorectal cancer screening usually begins at age 37 and continues through age 30.  If you have risk factors for colon cancer, your health care provider may recommend that you be screened at an earlier age.  If you have a family history of colorectal cancer, talk with your health care provider about genetic screening.  Your health care provider may also recommend using home test kits to check for  hidden blood in your stool.  A small camera at the end of a tube can be used to examine your colon directly (sigmoidoscopy or colonoscopy). This is done to check for the earliest forms of colorectal cancer.  Direct examination of the colon should be repeated every 5-10 years until age 58. However, if early forms of precancerous polyps or small growths are found or if you have a family history or genetic risk for colorectal cancer, you may need to be screened more often. Skin Cancer  Check your skin from head to toe regularly.  Monitor any moles. Be sure to tell your health care provider:  About any new moles or changes in moles, especially if there is a change in a mole's shape or color.  If you have a mole that is larger than the size of a pencil eraser.  If any of your family members has a history of skin cancer, especially at a young age, talk with your health care provider about genetic screening.  Always use sunscreen. Apply sunscreen liberally and repeatedly throughout the day.  Whenever you are outside, protect yourself by wearing long sleeves, pants, a wide-brimmed hat, and sunglasses. What should I know about osteoporosis? Osteoporosis is a condition in which bone destruction happens more quickly than new bone creation. After menopause, you may be at an increased risk for osteoporosis. To help prevent osteoporosis or the bone fractures that can happen because of osteoporosis, the following is recommended:  If you are 75-101 years old, get at least 1,000 mg of calcium and at least 600 mg of vitamin D per day.  If you are older than age 8 but younger than age 56, get at least 1,200 mg of calcium and at least 600 mg of vitamin D per day.  If you are older than age 73, get at least 1,200 mg of calcium and at least 800 mg of vitamin D per day. Smoking and excessive alcohol intake increase the risk of osteoporosis. Eat foods that are rich in calcium and vitamin D, and do weight-bearing  exercises several times each week as directed by your health care provider. What should I know about how menopause affects my mental health? Depression may occur at any age, but it is more common as you become older. Common symptoms of depression include:  Low or sad mood.  Changes in sleep patterns.  Changes in appetite or eating patterns.  Feeling an overall lack of motivation or enjoyment of activities that you previously enjoyed.  Frequent crying spells. Talk with your health care provider if you think that you are experiencing depression. What should I know about immunizations? It is important that you get and maintain your immunizations. These include:  Tetanus, diphtheria, and pertussis (Tdap) booster vaccine.  Influenza every year before the flu season begins.  Pneumonia vaccine.  Shingles vaccine.  Your health care provider may also recommend other immunizations. This information is not intended to replace advice given to you by your health care provider. Make sure you discuss any questions you have with your health care provider. Document Released: 10/11/2005 Document Revised: 03/08/2016 Document Reviewed: 05/23/2015 Elsevier Interactive Patient Education  2017 [employer].

## 2016-12-19 LAB — PAP IG W/ RFLX HPV ASCU: PAP SMEAR COMMENT: 0

## 2017-01-09 ENCOUNTER — Ambulatory Visit
Admission: RE | Admit: 2017-01-09 | Discharge: 2017-01-09 | Disposition: A | Discharge status: Home / Self Care | Payer: BC Managed Care – PPO | Source: Ambulatory Visit | Attending: Obstetrics and Gynecology | Admitting: Obstetrics and Gynecology

## 2017-01-09 DIAGNOSIS — Z1239 Encounter for other screening for malignant neoplasm of breast: Secondary | ICD-10-CM

## 2017-01-09 DIAGNOSIS — Z1231 Encounter for screening mammogram for malignant neoplasm of breast: Secondary | ICD-10-CM | POA: Insufficient documentation

## 2017-12-01 DIAGNOSIS — Z800 Family history of malignant neoplasm of digestive organs: Secondary | ICD-10-CM | POA: Insufficient documentation

## 2017-12-22 NOTE — Progress Notes (Signed)
Patient ID: Latasha [name], female   DOB: 1961-05-27, 57 y.o.   MRN: [id number]   Patient ID: Latasha [name], female   DOB: 1961-05-27, 57 y.o.   MRN: [id number] GYN ANNUAL PREVENTATIVE CARE ENCOUNTER NOTE  Subjective:       Latasha [name] is a 57 y.o. [phone] female here for a routine annual gynecologic exam.  Current complaints:  1.  none  Patient reports no major interval health issues. Bowel function is normal. Bladder function is notable for mild stress and urge symptoms; she is not desiring medical intervention Patient is not experiencing any significant vaginal atrophy symptoms; intercourse is not uncomfortable. Latasha [name] is not taking calcium with vitamin D. She is not exercising regularly due to arthritis knee problems.  Colonoscopy -to be scheduled for 03/2018   Gynecologic History No LMP recorded (lmp unknown). Patient has had a hysterectomy. Contraception: status post TAH/BSO Last Pap: 09/05/2015  Negative/neg Last mammogram: 01/09/2017 BI-RADS 1 History of endometriosis, asymptomatic  Obstetric History OB History  Gravida Para Term Preterm AB Living  2 2 2     2   SAB TAB Ectopic Multiple Live Births          2    # Outcome Date GA Lbr Len/2nd Weight Sex Delivery Anes PTL Lv  2 Term      Vag-Spont   LIV  1 Term      Vag-Spont   LIV    Past Medical History:  Diagnosis Date  . Chronic fatigue   . Endometriosis   . Family history of osteoporosis   . Hypertension   . IBS (irritable bowel syndrome)   . Obesity   . Surgical menopause   . Vaginal dryness     Past Surgical History:  Procedure Laterality Date  . ABDOMINAL HYSTERECTOMY     tah/bso    Current Outpatient Medications on File Prior to Visit  Medication Sig Dispense Refill  . acetaminophen (TYLENOL) 500 MG tablet Take by mouth.    . Ascorbic Acid (VITAMIN C) 1000 MG tablet Take 1,000 mg by mouth daily.    . bisoprolol-hydrochlorothiazide (ZIAC) 10-6.25 MG per tablet TAKE ONE TABLET BY MOUTH ONCE  DAILY    . cetirizine (ZYRTEC) 10 MG tablet Take 10 mg by mouth daily.    . diclofenac (VOLTAREN) 75 MG EC tablet Take by mouth.    . diphenhydrAMINE (BENADRYL) 25 MG tablet Take 25 mg by mouth every 6 (six) hours as needed.    . Glucosamine 500 MG CAPS Take 1 capsule by mouth daily.    . Multiple Vitamin (MULTI-VITAMINS) TABS Take by mouth.    . pantoprazole (PROTONIX) 40 MG tablet TAKE ONE TABLET BY MOUTH ONCE DAILY IN THE MORNING AND IN THE EVENING    . potassium chloride SA (K-DUR,KLOR-CON) 20 MEQ tablet Take by mouth.     No current facility-administered medications on file prior to visit.     Allergies  Allergen Reactions  . Moxifloxacin Hcl In Nacl Rash    Does not remember - onset 01/15/2010    Social History   Socioeconomic History  . Marital status: Married    Spouse name: Not on file  . Number of children: Not on file  . Years of education: Not on file  . Highest education level: Not on file  Occupational History  . Not on file  Social Needs  . Financial resource strain: Not on file  . Food insecurity:    Worry: Not on file  Inability: Not on file  . Transportation needs:    Medical: Not on file    Non-medical: Not on file  Tobacco Use  . Smoking status: Never Smoker  . Smokeless tobacco: Never Used  Substance and Sexual Activity  . Alcohol use: Yes    Alcohol/week: 0.0 oz    Comment: occas  . Drug use: No  . Sexual activity: Yes    Birth control/protection: Surgical  Lifestyle  . Physical activity:    Days per week: Not on file    Minutes per session: Not on file  . Stress: Not on file  Relationships  . Social connections:    Talks on phone: Not on file    Gets together: Not on file    Attends religious service: Not on file    Active member of club or organization: Not on file    Attends meetings of clubs or organizations: Not on file    Relationship status: Not on file  . Intimate partner violence:    Fear of current or ex partner: Not on file     Emotionally abused: Not on file    Physically abused: Not on file    Forced sexual activity: Not on file  Other Topics Concern  . Not on file  Social History Narrative  . Not on file    Family History  Problem Relation Age of Onset  . Hypertension Mother   . Osteoporosis Mother   . Hypertension Father   . Diabetes Sister   . Hypertension Sister   . Diabetes Maternal Grandmother   . Hypertension Maternal Grandmother   . Breast cancer Maternal Aunt        great aunt  . Cancer Neg Hx   . Ovarian cancer Neg Hx   . Colon cancer Neg Hx     The following portions of the patient's history were reviewed and updated as appropriate: allergies, current medications, past family history, past medical history, past social history, past surgical history and problem list.  Review of Systems   Objective:  No LMP recorded (lmp unknown). Patient has had a hysterectomy. BP (!) 151/84   Pulse 87   Ht 5\' 6"  (1.676 m)   Wt 209 lb 11.2 oz (95.1 kg)   LMP  (LMP Unknown)   BMI 33.85 kg/m  Physical Exam  Constitutional: She is oriented to person, place, and time. She appears well-developed and well-nourished.  HENT:  Head: Normocephalic and atraumatic.  Eyes: Conjunctivae and EOM are normal. No scleral icterus.  Neck: Normal range of motion. Neck supple. No thyromegaly present.  Cardiovascular: Normal rate and regular rhythm.  No murmur heard. Pulmonary/Chest: Effort normal and breath sounds normal.  Abdominal: Soft. She exhibits no distension and no mass. There is no tenderness. No hernia.  Genitourinary: Vagina normal. No vaginal discharge found.  Genitourinary Comments: External genitalia-normal BUS-normal Vagina-decreased estrogen effect. Cervix-surgically absent Uterus-surgically absent Adnexa-no masses or tenderness. Rectovaginal-normal sphincter tone,; no rectal masses  Musculoskeletal: She exhibits no edema or tenderness.  Lymphadenopathy:    She has no cervical adenopathy.   Neurological: She is alert and oriented to person, place, and time.  Skin: Skin is warm and dry.  Psychiatric: She has a normal mood and affect. Her behavior is normal.    Assessment:   Annual gynecologic examination 57 y.o. Contraception: menopause; status post TAH/BSO Obesity 1 Chronic hypertension, Surgical menopause, asymptomatic GERD Rectal bleeding Family history of cancer of the colon and appendix in mom  Plan:  Pap:pap/hpv- per pt requst Mammogram: Ordered Colonoscopy-ordered Labs: Primary care-Dr. Bethann Punches Routine preventative health maintenance measures emphasized: Diet/Weight control, Tobacco Cessation and Alcohol/Drug use Encourage calcium with vitamin D supplementation Patient encouraged to lose 1 pound a month.  Over the next 12 months Return to Clinic - 1 [address] [place], [place] Herold Harms, MD  Note: This dictation was prepared with Dragon dictation along with smaller phrase technology. Any transcriptional errors that result from this process are unintentional.

## 2017-12-24 ENCOUNTER — Encounter: Payer: Self-pay | Admitting: Obstetrics and Gynecology

## 2017-12-24 ENCOUNTER — Ambulatory Visit (INDEPENDENT_AMBULATORY_CARE_PROVIDER_SITE_OTHER): Payer: BC Managed Care – PPO | Admitting: Obstetrics and Gynecology

## 2017-12-24 VITALS — BP 151/84 | HR 87 | Ht 66.0 in | Wt 209.7 lb

## 2017-12-24 DIAGNOSIS — Z90722 Acquired absence of ovaries, bilateral: Secondary | ICD-10-CM

## 2017-12-24 DIAGNOSIS — Z1231 Encounter for screening mammogram for malignant neoplasm of breast: Secondary | ICD-10-CM

## 2017-12-24 DIAGNOSIS — Z1211 Encounter for screening for malignant neoplasm of colon: Secondary | ICD-10-CM

## 2017-12-24 DIAGNOSIS — Z9079 Acquired absence of other genital organ(s): Secondary | ICD-10-CM | POA: Diagnosis not present

## 2017-12-24 DIAGNOSIS — N809 Endometriosis, unspecified: Secondary | ICD-10-CM | POA: Diagnosis not present

## 2017-12-24 DIAGNOSIS — Z01419 Encounter for gynecological examination (general) (routine) without abnormal findings: Secondary | ICD-10-CM

## 2017-12-24 DIAGNOSIS — Z1239 Encounter for other screening for malignant neoplasm of breast: Secondary | ICD-10-CM

## 2017-12-24 DIAGNOSIS — Z90710 Acquired absence of both cervix and uterus: Secondary | ICD-10-CM

## 2017-12-24 NOTE — Patient Instructions (Addendum)
1.  Pap smear done. 2.  Mammogram is ordered 3.  Screening colonoscopy referral is made 4.  Screening labs are to be obtained through primary care-Dr. Sabra Heck 5.  Continue with healthy eating, exercise, and controlled weight loss 6.  Calcium 1200 mg a day and vitamin D 800 international units daily is recommended 7.  Return in 1 year for annual exam   Health Maintenance for Postmenopausal Women Menopause is a normal process in which your reproductive ability comes to an end. This process happens gradually over a span of months to years, usually between the ages of 25 and 26. Menopause is complete when you have missed 12 consecutive menstrual periods. It is important to talk with your health care provider about some of the most common conditions that affect postmenopausal women, such as heart disease, cancer, and bone loss (osteoporosis). Adopting a healthy lifestyle and getting preventive care can help to promote your health and wellness. Those actions can also lower your chances of developing some of these common conditions. What should I know about menopause? During menopause, you may experience a number of symptoms, such as:  Moderate-to-severe hot flashes.  Night sweats.  Decrease in sex drive.  Mood swings.  Headaches.  Tiredness.  Irritability.  Memory problems.  Insomnia.  Choosing to treat or not to treat menopausal changes is an individual decision that you make with your health care provider. What should I know about hormone replacement therapy and supplements? Hormone therapy products are effective for treating symptoms that are associated with menopause, such as hot flashes and night sweats. Hormone replacement carries certain risks, especially as you become older. If you are thinking about using estrogen or estrogen with progestin treatments, discuss the benefits and risks with your health care provider. What should I know about heart disease and stroke? Heart  disease, heart attack, and stroke become more likely as you age. This may be due, in part, to the hormonal changes that your body experiences during menopause. These can affect how your body processes dietary fats, triglycerides, and cholesterol. Heart attack and stroke are both medical emergencies. There are many things that you can do to help prevent heart disease and stroke:  Have your blood pressure checked at least every 1-2 years. High blood pressure causes heart disease and increases the risk of stroke.  If you are 9-74 years old, ask your health care provider if you should take aspirin to prevent a heart attack or a stroke.  Do not use any tobacco products, including cigarettes, chewing tobacco, or electronic cigarettes. If you need help quitting, ask your health care provider.  It is important to eat a healthy diet and maintain a healthy weight. ? Be sure to include plenty of vegetables, fruits, low-fat dairy products, and lean protein. ? Avoid eating foods that are high in solid fats, added sugars, or salt (sodium).  Get regular exercise. This is one of the most important things that you can do for your health. ? Try to exercise for at least 150 minutes each week. The type of exercise that you do should increase your heart rate and make you sweat. This is known as moderate-intensity exercise. ? Try to do strengthening exercises at least twice each week. Do these in addition to the moderate-intensity exercise.  Know your numbers.Ask your health care provider to check your cholesterol and your blood glucose. Continue to have your blood tested as directed by your health care provider.  What should I know about cancer screening?  There are several types of cancer. Take the following steps to reduce your risk and to catch any cancer development as early as possible. Breast Cancer  Practice breast self-awareness. ? This means understanding how your breasts normally appear and feel. ? It  also means doing regular breast self-exams. Let your health care provider know about any changes, no matter how small.  If you are 33 or older, have a clinician do a breast exam (clinical breast exam or CBE) every year. Depending on your age, family history, and medical history, it may be recommended that you also have a yearly breast X-ray (mammogram).  If you have a family history of breast cancer, talk with your health care provider about genetic screening.  If you are at high risk for breast cancer, talk with your health care provider about having an MRI and a mammogram every year.  Breast cancer (BRCA) gene test is recommended for women who have family members with BRCA-related cancers. Results of the assessment will determine the need for genetic counseling and BRCA1 and for BRCA2 testing. BRCA-related cancers include these types: ? Breast. This occurs in males or females. ? Ovarian. ? Tubal. This may also be called fallopian tube cancer. ? Cancer of the abdominal or pelvic lining (peritoneal cancer). ? Prostate. ? Pancreatic.  Cervical, Uterine, and Ovarian Cancer Your health care provider may recommend that you be screened regularly for cancer of the pelvic organs. These include your ovaries, uterus, and vagina. This screening involves a pelvic exam, which includes checking for microscopic changes to the surface of your cervix (Pap test).  For women ages 21-65, health care providers may recommend a pelvic exam and a Pap test every three years. For women ages 21-65, they may recommend the Pap test and pelvic exam, combined with testing for human papilloma virus (HPV), every five years. Some types of HPV increase your risk of cervical cancer. Testing for HPV may also be done on women of any age who have unclear Pap test results.  Other health care providers may not recommend any screening for nonpregnant women who are considered low risk for pelvic cancer and have no symptoms. Ask your  health care provider if a screening pelvic exam is right for you.  If you have had past treatment for cervical cancer or a condition that could lead to cancer, you need Pap tests and screening for cancer for at least 20 years after your treatment. If Pap tests have been discontinued for you, your risk factors (such as having a new sexual partner) need to be reassessed to determine if you should start having screenings again. Some women have medical problems that increase the chance of getting cervical cancer. In these cases, your health care provider may recommend that you have screening and Pap tests more often.  If you have a family history of uterine cancer or ovarian cancer, talk with your health care provider about genetic screening.  If you have vaginal bleeding after reaching menopause, tell your health care provider.  There are currently no reliable tests available to screen for ovarian cancer.  Lung Cancer Lung cancer screening is recommended for adults 32-87 years old who are at high risk for lung cancer because of a history of smoking. A yearly low-dose CT scan of the lungs is recommended if you:  Currently smoke.  Have a history of at least 30 pack-years of smoking and you currently smoke or have quit within the past 15 years. A pack-year is smoking an  average of one pack of cigarettes per day for one year.  Yearly screening should:  Continue until it has been 15 years since you quit.  Stop if you develop a health problem that would prevent you from having lung cancer treatment.  Colorectal Cancer  This type of cancer can be detected and can often be prevented.  Routine colorectal cancer screening usually begins at age 9 and continues through age 18.  If you have risk factors for colon cancer, your health care provider may recommend that you be screened at an earlier age.  If you have a family history of colorectal cancer, talk with your health care provider about genetic  screening.  Your health care provider may also recommend using home test kits to check for hidden blood in your stool.  A small camera at the end of a tube can be used to examine your colon directly (sigmoidoscopy or colonoscopy). This is done to check for the earliest forms of colorectal cancer.  Direct examination of the colon should be repeated every 5-10 years until age 70. However, if early forms of precancerous polyps or small growths are found or if you have a family history or genetic risk for colorectal cancer, you may need to be screened more often.  Skin Cancer  Check your skin from head to toe regularly.  Monitor any moles. Be sure to tell your health care provider: ? About any new moles or changes in moles, especially if there is a change in a mole's shape or color. ? If you have a mole that is larger than the size of a pencil eraser.  If any of your family members has a history of skin cancer, especially at a young age, talk with your health care provider about genetic screening.  Always use sunscreen. Apply sunscreen liberally and repeatedly throughout the day.  Whenever you are outside, protect yourself by wearing long sleeves, pants, a wide-brimmed hat, and sunglasses.  What should I know about osteoporosis? Osteoporosis is a condition in which bone destruction happens more quickly than new bone creation. After menopause, you may be at an increased risk for osteoporosis. To help prevent osteoporosis or the bone fractures that can happen because of osteoporosis, the following is recommended:  If you are 76-9 years old, get at least 1,000 mg of calcium and at least 600 mg of vitamin D per day.  If you are older than age 45 but younger than age 75, get at least 1,200 mg of calcium and at least 600 mg of vitamin D per day.  If you are older than age 39, get at least 1,200 mg of calcium and at least 800 mg of vitamin D per day.  Smoking and excessive alcohol intake  increase the risk of osteoporosis. Eat foods that are rich in calcium and vitamin D, and do weight-bearing exercises several times each week as directed by your health care provider. What should I know about how menopause affects my mental health? Depression may occur at any age, but it is more common as you become older. Common symptoms of depression include:  Low or sad mood.  Changes in sleep patterns.  Changes in appetite or eating patterns.  Feeling an overall lack of motivation or enjoyment of activities that you previously enjoyed.  Frequent crying spells.  Talk with your health care provider if you think that you are experiencing depression. What should I know about immunizations? It is important that you get and maintain your immunizations.  These include:  Tetanus, diphtheria, and pertussis (Tdap) booster vaccine.  Influenza every year before the flu season begins.  Pneumonia vaccine.  Shingles vaccine.  Your health care provider may also recommend other immunizations. This information is not intended to replace advice given to you by your health care provider. Make sure you discuss any questions you have with your health care provider. Document Released: 10/11/2005 Document Revised: 03/08/2016 Document Reviewed: 05/23/2015 Elsevier Interactive Patient Education  2018 [employer].

## 2017-12-25 ENCOUNTER — Telehealth: Payer: Self-pay | Admitting: Gastroenterology

## 2017-12-25 NOTE — Telephone Encounter (Signed)
Gastroenterology Pre-Procedure Review  Request Date:   Requesting Physician: Dr.    PATIENT REVIEW QUESTIONS: The patient responded to the following health history questions as indicated:    1. Are you having any GI issues? yes (strains with bowel movement with some blood, small hernia) 2. Do you have a personal history of Polyps? no 3. Do you have a family history of Colon Cancer or Polyps? no 4. Diabetes Mellitus? no 5. Joint replacements in the past 12 months?no 6. Major health problems in the past 3 months?no 7. Any artificial heart valves, MVP, or defibrillator?no    MEDICATIONS & ALLERGIES:    Patient reports the following regarding taking any anticoagulation/antiplatelet therapy:   Plavix, Coumadin, Eliquis, Xarelto, Lovenox, Pradaxa, Brilinta, or Effient? no Aspirin? no  Patient confirms/reports the following medications:  Current Outpatient Medications  Medication Sig Dispense Refill   acetaminophen (TYLENOL) 500 MG tablet Take by mouth.     Ascorbic Acid (VITAMIN C) 1000 MG tablet Take 1,000 mg by mouth daily.     bisoprolol-hydrochlorothiazide (ZIAC) 10-6.25 MG per tablet TAKE ONE TABLET BY MOUTH ONCE DAILY     cetirizine (ZYRTEC) 10 MG tablet Take 10 mg by mouth daily.     diclofenac (VOLTAREN) 75 MG EC tablet Take by mouth.     diphenhydrAMINE (BENADRYL) 25 MG tablet Take 25 mg by mouth every 6 (six) hours as needed.     Glucosamine 500 MG CAPS Take 1 capsule by mouth daily.     Multiple Vitamin (MULTI-VITAMINS) TABS Take by mouth.     pantoprazole (PROTONIX) 40 MG tablet TAKE ONE TABLET BY MOUTH ONCE DAILY IN THE MORNING AND IN THE EVENING     potassium chloride SA (K-DUR,KLOR-CON) 20 MEQ tablet Take by mouth.     No current facility-administered medications for this visit.     Patient confirms/reports the following allergies:  Allergies  Allergen Reactions   Moxifloxacin Hcl In Nacl Rash    Does not remember - onset 01/15/2010    No orders of the  defined types were placed in this encounter.   AUTHORIZATION INFORMATION Primary Insurance: 1D#: Group #:  Secondary Insurance: 1D#: Group #:  SCHEDULE INFORMATION: Date:  Time: Location:

## 2017-12-27 LAB — IGP, COBASHPV16/18
HPV 16: NEGATIVE
HPV 18: NEGATIVE
HPV other hr types: NEGATIVE
PAP Smear Comment: 0

## 2018-01-12 ENCOUNTER — Telehealth: Payer: Self-pay

## 2018-01-12 NOTE — Telephone Encounter (Signed)
LVM for pt to contact office regarding referral (Colonoscopy).  Thanks [employer]

## 2018-01-15 ENCOUNTER — Ambulatory Visit
Admission: RE | Admit: 2018-01-15 | Discharge: 2018-01-15 | Disposition: A | Discharge status: Home / Self Care | Payer: BC Managed Care – PPO | Source: Ambulatory Visit | Attending: Obstetrics and Gynecology | Admitting: Obstetrics and Gynecology

## 2018-01-15 DIAGNOSIS — Z1231 Encounter for screening mammogram for malignant neoplasm of breast: Secondary | ICD-10-CM | POA: Diagnosis present

## 2018-01-15 DIAGNOSIS — Z1239 Encounter for other screening for malignant neoplasm of breast: Secondary | ICD-10-CM

## 2018-01-19 NOTE — Telephone Encounter (Signed)
Setup recall letter for follow-up and reminder.

## 2018-01-19 NOTE — Telephone Encounter (Signed)
Latasha [name] states she will callback when school is out to schedule.

## 2018-03-23 ENCOUNTER — Other Ambulatory Visit: Payer: Self-pay | Admitting: Student

## 2018-03-23 DIAGNOSIS — M65222 Calcific tendinitis, left upper arm: Secondary | ICD-10-CM

## 2018-03-23 DIAGNOSIS — M7582 Other shoulder lesions, left shoulder: Secondary | ICD-10-CM

## 2018-04-07 ENCOUNTER — Ambulatory Visit: Admission: RE | Admit: 2018-04-07 | Payer: BC Managed Care – PPO | Source: Ambulatory Visit

## 2018-12-29 ENCOUNTER — Encounter: Payer: BC Managed Care – PPO | Admitting: Obstetrics and Gynecology

## 2019-04-02 ENCOUNTER — Other Ambulatory Visit: Payer: Self-pay | Admitting: Internal Medicine

## 2019-04-09 ENCOUNTER — Other Ambulatory Visit: Payer: Self-pay | Admitting: Internal Medicine

## 2019-04-09 DIAGNOSIS — Z1231 Encounter for screening mammogram for malignant neoplasm of breast: Secondary | ICD-10-CM

## 2019-05-27 ENCOUNTER — Ambulatory Visit
Admission: RE | Admit: 2019-05-27 | Discharge: 2019-05-27 | Disposition: A | Discharge status: Home / Self Care | Payer: BC Managed Care – PPO | Source: Ambulatory Visit | Attending: Internal Medicine | Admitting: Internal Medicine

## 2019-05-27 DIAGNOSIS — Z1231 Encounter for screening mammogram for malignant neoplasm of breast: Secondary | ICD-10-CM | POA: Diagnosis present

## 2019-07-24 ENCOUNTER — Other Ambulatory Visit: Payer: Self-pay

## 2019-07-24 DIAGNOSIS — Z20822 Contact with and (suspected) exposure to covid-19: Secondary | ICD-10-CM

## 2019-07-26 LAB — NOVEL CORONAVIRUS, NAA: SARS-CoV-2, NAA: NOT DETECTED

## 2019-08-30 ENCOUNTER — Ambulatory Visit: Payer: BC Managed Care – PPO | Attending: Internal Medicine

## 2019-08-30 DIAGNOSIS — Z20822 Contact with and (suspected) exposure to covid-19: Secondary | ICD-10-CM

## 2019-09-01 LAB — NOVEL CORONAVIRUS, NAA: SARS-CoV-2, NAA: DETECTED — AB

## 2019-12-23 ENCOUNTER — Other Ambulatory Visit: Payer: Self-pay

## 2019-12-23 ENCOUNTER — Ambulatory Visit: Payer: BC Managed Care – PPO | Admitting: Dermatology

## 2019-12-23 DIAGNOSIS — L732 Hidradenitis suppurativa: Secondary | ICD-10-CM

## 2019-12-23 DIAGNOSIS — L309 Dermatitis, unspecified: Secondary | ICD-10-CM

## 2019-12-23 MED ORDER — TRIAMCINOLONE ACETONIDE 0.1 % EX CREA: 1.0000 "application " | TOPICAL_CREAM | Freq: Two times a day (BID) | CUTANEOUS | 1 refills | Status: AC

## 2019-12-23 MED ORDER — DOXYCYCLINE MONOHYDRATE 100 MG PO CAPS: 100.0000 mg | ORAL_CAPSULE | Freq: Two times a day (BID) | ORAL | 2 refills | Status: DC

## 2019-12-23 MED ORDER — CLINDAMYCIN PHOSPHATE 1 % EX LOTN: TOPICAL_LOTION | CUTANEOUS | 2 refills | Status: DC

## 2019-12-23 NOTE — Progress Notes (Signed)
   New Patient Visit  Subjective  Latasha [name] is a 59 y.o. female who presents for the following: Rash.  Patient here today for spots on abdomen and groin area. She has been getting them for 3-5 years. When they first appear they burn and are like a blister, some she can get pus out of. When they go away they leave a brown spot. She was given doxycycline years ago by Dr. Dwaine Deter but had no improvement. She is not sure how long she used it but does not think it was for 2-3 months.  Also has been treated for eczema of the hands with Eucrisa and another topical from Dr. Dwaine Deter. Also used HPR.  The following portions of the chart were reviewed this encounter and updated as appropriate:  Tobacco  Allergies  Meds  Problems  Med Hx  Surg Hx  Fam Hx     Review of Systems:  No other skin or systemic complaints except as noted in HPI or Assessment and Plan.  Objective  Well appearing patient in no apparent distress; mood and affect are within normal limits.  A focused examination was performed including face, eyelids, neck, bilateral arms, bilateral hands, fingernails, chest, abdomen, groin, bilateral upper legs. Relevant physical exam findings are noted in the Assessment and Plan.  Objective  Hands: Scaly pink plaques palms  Objective  Abdomen, groin: Scattered inflammatory papules and cysts mid abdomen. Many open comedones and few scars inner thighs.   Assessment & Plan  Dermatitis Hands  Start TMC 0.1% cream twice a day as needed Avoid applying to face, groin, and axilla. Use as directed. Risk of skin atrophy with long-term use reviewed.    Topical steroids (such as triamcinolone, fluocinolone, fluocinonide, mometasone, clobetasol, halobetasol, betamethasone, hydrocortisone) can cause thinning and lightening of the skin if they are used for too long in the same area. Your physician has selected the right strength medicine for your problem and area affected on the body.  Please use your medication only as directed by your physician to prevent side effects.      Ordered Medications: triamcinolone cream (KENALOG) 0.1 %  Hidradenitis suppurativa Abdomen, groin  Chronic, flared  Start clindamycin lotion to areas QD Start Hibiclens in shower daily. Avoid applying to vaginal area.  Start doxycycline monohydrate 100mg  1 PO BID with food #60 2RF Consider switching to low dose on follow up. Discussed spironolactone but pt worried about it lowering blood pressure.   Doxycycline should be taken with food to prevent nausea. Do not lay down for 30 minutes after taking. Be cautious with sun exposure and use good sun protection while on this medication. Pregnant women should not take this medication.   Patient advised brown spots may fade but scars will not disappear, though they can improve within the first year after they form.  Ordered Medications: clindamycin (CLEOCIN-T) 1 % lotion doxycycline (MONODOX) 100 MG capsule  Return in about 3 months (around 03/23/2020) for HS.   03/25/2020, RMA, am acting as scribe for Anise Salvo, MD .  Documentation: I have reviewed the above documentation for accuracy and completeness, and I agree with the above.  Darden Dates, MD

## 2019-12-23 NOTE — Patient Instructions (Addendum)
Start Hibiclens daily to wash areas, leave on for a minute before rinsing. Avoid getting it internally.  Start clindamycin lotion once a day to the areas where the spots come up.   Doxycycline should be taken with food to prevent nausea. Do not lay down for 30 minutes after taking. Be cautious with sun exposure and use good sun protection while on this medication. Pregnant women should not take this medication.   Recommend [employer] [place] hand cream daily.   Topical steroids (such as triamcinolone, fluocinolone, fluocinonide, mometasone, clobetasol, halobetasol, betamethasone, hydrocortisone) can cause thinning and lightening of the skin if they are used for too long in the same area. Your physician has selected the right strength medicine for your problem and area affected on the body. Please use your medication only as directed by your physician to prevent side effects.

## 2020-01-08 ENCOUNTER — Encounter: Payer: Self-pay | Admitting: Dermatology

## 2020-01-13 ENCOUNTER — Ambulatory Visit: Payer: BC Managed Care – PPO | Admitting: Dermatology

## 2020-02-07 ENCOUNTER — Other Ambulatory Visit: Payer: Self-pay | Admitting: Internal Medicine

## 2020-02-07 DIAGNOSIS — Z1231 Encounter for screening mammogram for malignant neoplasm of breast: Secondary | ICD-10-CM

## 2020-03-21 ENCOUNTER — Ambulatory Visit: Payer: BC Managed Care – PPO | Admitting: Dermatology

## 2020-06-17 ENCOUNTER — Other Ambulatory Visit: Payer: Self-pay

## 2020-06-17 ENCOUNTER — Ambulatory Visit
Admission: EM | Admit: 2020-06-17 | Discharge: 2020-06-17 | Disposition: A | Discharge status: Home / Self Care | Payer: BC Managed Care – PPO | Attending: Physician Assistant | Admitting: Physician Assistant

## 2020-06-17 ENCOUNTER — Encounter: Payer: Self-pay | Admitting: Emergency Medicine

## 2020-06-17 DIAGNOSIS — L509 Urticaria, unspecified: Secondary | ICD-10-CM | POA: Diagnosis not present

## 2020-06-17 MED ORDER — DEXAMETHASONE SODIUM PHOSPHATE 10 MG/ML IJ SOLN
10.0000 mg | Freq: Once | INTRAMUSCULAR | Status: AC
  Administered 2020-06-17: 10 mg via INTRAMUSCULAR

## 2020-06-17 NOTE — ED Triage Notes (Signed)
Went out to eat Thursday night and developed itching and rash. She took a benadryl and it resolved.   She went to [employer] today and broke out in hives. Took a benadryl an hour ago. Still has hives on arms and rash on stomach / chest.   No difficulty breathing, no throat swelling.   She is taking prednisone daily for her knees.

## 2020-06-17 NOTE — Discharge Instructions (Signed)
Decadron injection in office today. Can take prednisone 40mg  daily for 2-3 days if residual rash. Continue allergy medicine. Monitor for new exposures. Follow up with PCP for recheck

## 2020-06-17 NOTE — ED Provider Notes (Signed)
[facility]    CSN: [id number] Arrival date & time: 06/17/20  1511      History   Chief Complaint Chief Complaint  Patient presents with  . Rash    HPI Latasha Raisch is a 59 y.o. female.   59 year old female comes in for hives. States first noticed hives 3 days ago when eating out, took benadryl, for which resolved the rash. States was at [employer] today, and hives returned. Took another does of benadryl 1 hour ago without relief. Rash is to BUE, stomach, chest. Denies difficulty breathing, oral swelling.  Takes prednisone 20mg  QD     Past Medical History:  Diagnosis Date  . Chronic fatigue   . Endometriosis   . Family history of osteoporosis   . Hypertension   . IBS (irritable bowel syndrome)   . Obesity   . Surgical menopause   . Vaginal dryness     Patient Active Problem List   Diagnosis Date Noted  . BP (high blood pressure) 09/05/2015  . 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 Postsurgical menopause 09/05/2015  . Class 1 obesity 09/05/2015  . GERD (gastroesophageal reflux disease) 09/05/2015  . Endometriosis 09/05/2015  . Status post total hysterectomy and bilateral salpingo-oophorectomy 09/05/2015  . Primary osteoarthritis of both knees 09/28/2014    Past Surgical History:  Procedure Laterality Date  . ABDOMINAL HYSTERECTOMY     tah/bso    OB History    Gravida  2   Para  2   Term  2   Preterm      AB      Living  2     SAB      TAB      Ectopic      Multiple      Live Births  2           Home Medications    Prior to Admission medications   Medication Sig Start Date End Date Taking? Authorizing Provider  acetaminophen (TYLENOL) 500 MG tablet Take by mouth.    [provider]  Ascorbic Acid (VITAMIN C) 1000 MG tablet Take 1,000 mg by mouth daily.    [provider]  bisoprolol-hydrochlorothiazide (ZIAC) 10-6.25 MG per tablet TAKE ONE TABLET BY MOUTH ONCE DAILY 10/17/14   [provider]  cetirizine (ZYRTEC) 10 MG tablet Take 10 mg by mouth daily.    [provider]  clindamycin (CLEOCIN-T) 1 % lotion Apply to affected areas once daily 12/23/19   [facility], [facility], MD  diclofenac (VOLTAREN) 75 MG EC tablet Take by mouth. 09/28/14   [provider]  diphenhydrAMINE (BENADRYL) 25 MG tablet Take 25 mg by mouth every 6 (six) hours as needed.    [provider]  doxycycline (MONODOX) 100 MG capsule Take 1 capsule (100 mg total) by mouth 2 (two) times daily. With food 12/23/19   Moye, 12/25/19, MD  Glucosamine 500 MG CAPS Take 1 capsule by mouth daily.    [provider]  Multiple Vitamin (MULTI-VITAMINS) TABS Take by mouth.    [provider]  pantoprazole (PROTONIX) 40 MG tablet TAKE ONE TABLET BY MOUTH ONCE DAILY IN THE MORNING AND IN THE EVENING 10/17/14   [provider]  potassium chloride SA (K-DUR,KLOR-CON) 20 MEQ tablet Take by mouth. 09/28/14   [provider]  triamcinolone cream (KENALOG) 0.1 % Apply 1 application topically 2 (two) times daily. As needed to hands. Avoid face, groin, underarms. 12/23/19   12/25/19, MD  Family History Family History  Problem Relation Age of Onset  . Hypertension Mother   . Osteoporosis Mother   . Hypertension Father   . Diabetes Sister   . Hypertension Sister   . Cancer Sister   . Diabetes Maternal Grandmother   . Hypertension Maternal Grandmother   . Breast cancer Maternal Aunt        great aunt  . Ovarian cancer Neg Hx   . Colon cancer Neg Hx     Social History Social History   Tobacco Use  . Smoking status: Never Smoker  . Smokeless tobacco: Never Used  Vaping Use  . Vaping Use: Never used  Substance Use Topics  . Alcohol use: Yes    Alcohol/week: 0.0 standard drinks    Comment: occas  . Drug use: No     Allergies   Moxifloxacin hcl in nacl   Review of Systems Review of Systems  Reason unable to perform ROS: See HPI as above.      Physical Exam Triage Vital Signs ED Triage Vitals  Enc Vitals Group     BP 06/17/20 1527 (!) 190/115     Pulse Rate 06/17/20 1527 78     Resp 06/17/20 1527 16     Temp 06/17/20 1527 98.2 F (36.8 C)     Temp Source 06/17/20 1527 Oral     SpO2 06/17/20 1527 96 %     Weight --      Height --      Head Circumference --      Peak Flow --      Pain Score 06/17/20 1526 0     Pain Loc --      Pain Edu? --      Excl. in GC? --    No data found.  Updated Vital Signs BP (!) 190/115   Pulse 78   Temp 98.2 F (36.8 C) (Oral)   Resp 16   LMP  (LMP Unknown)   SpO2 96%   Physical Exam Constitutional:      General: She is not in acute distress.    Appearance: Normal appearance. She is well-developed. She is not toxic-appearing or diaphoretic.  HENT:     Head: Normocephalic and atraumatic.  Eyes:     Conjunctiva/sclera: Conjunctivae normal.     Pupils: Pupils are equal, round, and reactive to light.  Pulmonary:     Effort: Pulmonary effort is normal. No respiratory distress.  Musculoskeletal:     Cervical back: Normal range of motion and neck supple.  Skin:    General: Skin is warm and dry.     Comments: Diffuse hives  Neurological:     Mental Status: She is alert and oriented to person, place, and time.      UC Treatments / Results  Labs (all labs ordered are listed, but only abnormal results are displayed) Labs Reviewed - No data to display  EKG   Radiology No results found.  Procedures Procedures (including critical care time)  Medications Ordered in UC Medications  dexamethasone (DECADRON) injection 10 mg (10 mg Intramuscular Given 06/17/20 1606)    Initial Impression / Assessment and Plan / UC Course  I have reviewed the triage vital signs and the nursing notes.  Pertinent labs & imaging results that were available during my care of the patient were reviewed by me and considered in my medical decision making (see chart for details).     Decadron injection in office today. If needed, can add  prednisone 40mg  QD x 3 days. Other symptomatic treatment discussed. Return precautions given.  Final Clinical Impressions(s) / UC Diagnoses   Final diagnoses:  Hives    ED Prescriptions    None     PDMP not reviewed this encounter.   , PA-C 06/18/20 0800

## 2020-07-13 DIAGNOSIS — E538 Deficiency of other specified B group vitamins: Secondary | ICD-10-CM | POA: Insufficient documentation

## 2020-09-06 ENCOUNTER — Ambulatory Visit (INDEPENDENT_AMBULATORY_CARE_PROVIDER_SITE_OTHER): Payer: BC Managed Care – PPO

## 2020-09-06 ENCOUNTER — Ambulatory Visit (INDEPENDENT_AMBULATORY_CARE_PROVIDER_SITE_OTHER): Payer: BC Managed Care – PPO | Admitting: Sports Medicine

## 2020-09-06 ENCOUNTER — Other Ambulatory Visit: Payer: Self-pay

## 2020-09-06 DIAGNOSIS — M11231 Other chondrocalcinosis, right wrist: Secondary | ICD-10-CM | POA: Diagnosis not present

## 2020-09-06 DIAGNOSIS — M25531 Pain in right wrist: Secondary | ICD-10-CM

## 2020-09-06 DIAGNOSIS — M654 Radial styloid tenosynovitis [de Quervain]: Secondary | ICD-10-CM | POA: Insufficient documentation

## 2020-09-06 MED ORDER — PREDNISONE 50 MG PO TABS: ORAL_TABLET | ORAL | 0 refills | Status: DC

## 2020-09-06 MED ORDER — ETODOLAC ER 600 MG PO TB24: 600.0000 mg | ORAL_TABLET | Freq: Every day | ORAL | 11 refills | Status: DC

## 2020-09-06 NOTE — Progress Notes (Signed)
    Procedures performed today:    None.  Independent interpretation of notes and tests performed by another provider:   None.  Brief History, Exam, Impression, and Recommendations:    Right wrist pain For approximately 10 days this 60 year old female has had pain on the volar right wrist, she feels a small lump. Minimal swelling. No trauma. On exam she has what feels to be a palpable ganglion over the volar wrist joint, and minimal pain over the radial aspect of the radiocarpal joint. She has tried multiple NSAIDs in the past without much improvement, we are going to discontinue ibuprofen, start a 5-day burst of prednisone followed by etodolac 600 daily, x-rays, and a thumb spica brace for approximately 4 weeks, return to see me after that, we will consider MRI versus injection if no better.    ___________________________________________ Ihor Austin. Benjamin Stain, M.D., ABFM., CAQSM. Primary Care and Sports Medicine [facility]  Adjunct Instructor of Family Medicine  [employer]

## 2020-09-06 NOTE — Assessment & Plan Note (Signed)
For approximately 10 days this 60 year old female has had pain on the volar right wrist, she feels a small lump. Minimal swelling. No trauma. On exam she has what feels to be a palpable ganglion over the volar wrist joint, and minimal pain over the radial aspect of the radiocarpal joint. She has tried multiple NSAIDs in the past without much improvement, we are going to discontinue ibuprofen, start a 5-day burst of prednisone followed by etodolac 600 daily, x-rays, and a thumb spica brace for approximately 4 weeks, return to see me after that, we will consider MRI versus injection if no better.

## 2020-10-03 ENCOUNTER — Ambulatory Visit: Payer: BC Managed Care – PPO | Admitting: Sports Medicine

## 2020-10-04 ENCOUNTER — Ambulatory Visit: Payer: BC Managed Care – PPO | Admitting: Sports Medicine

## 2020-11-13 ENCOUNTER — Other Ambulatory Visit: Payer: Self-pay | Admitting: Internal Medicine

## 2020-11-13 DIAGNOSIS — Z1231 Encounter for screening mammogram for malignant neoplasm of breast: Secondary | ICD-10-CM

## 2020-12-01 ENCOUNTER — Other Ambulatory Visit: Payer: Self-pay

## 2020-12-01 ENCOUNTER — Ambulatory Visit
Admission: RE | Admit: 2020-12-01 | Discharge: 2020-12-01 | Disposition: A | Discharge status: Home / Self Care | Payer: BC Managed Care – PPO | Source: Ambulatory Visit | Attending: Internal Medicine | Admitting: Internal Medicine

## 2020-12-01 DIAGNOSIS — Z1231 Encounter for screening mammogram for malignant neoplasm of breast: Secondary | ICD-10-CM | POA: Insufficient documentation

## 2021-02-15 ENCOUNTER — Other Ambulatory Visit: Payer: Self-pay | Admitting: Internal Medicine

## 2021-02-15 DIAGNOSIS — M5416 Radiculopathy, lumbar region: Secondary | ICD-10-CM

## 2021-02-27 ENCOUNTER — Other Ambulatory Visit: Payer: Self-pay | Admitting: Internal Medicine

## 2021-02-27 DIAGNOSIS — M5416 Radiculopathy, lumbar region: Secondary | ICD-10-CM

## 2021-02-27 DIAGNOSIS — M1611 Unilateral primary osteoarthritis, right hip: Secondary | ICD-10-CM

## 2021-03-03 ENCOUNTER — Ambulatory Visit: Payer: BC Managed Care – PPO

## 2021-03-03 ENCOUNTER — Other Ambulatory Visit: Payer: Self-pay

## 2021-03-03 DIAGNOSIS — M1611 Unilateral primary osteoarthritis, right hip: Secondary | ICD-10-CM

## 2021-03-03 DIAGNOSIS — M5416 Radiculopathy, lumbar region: Secondary | ICD-10-CM

## 2021-05-25 ENCOUNTER — Other Ambulatory Visit: Admission: RE | Admit: 2021-05-25 | Payer: BC Managed Care – PPO | Source: Ambulatory Visit

## 2021-05-29 ENCOUNTER — Other Ambulatory Visit: Payer: BC Managed Care – PPO

## 2021-05-31 ENCOUNTER — Other Ambulatory Visit: Admission: RE | Admit: 2021-05-31 | Payer: BC Managed Care – PPO | Source: Ambulatory Visit

## 2021-06-04 ENCOUNTER — Ambulatory Visit
Admission: RE | Admit: 2021-06-04 | Payer: BC Managed Care – PPO | Source: Home / Self Care | Admitting: Orthopedic Surgery

## 2021-06-04 ENCOUNTER — Encounter: Admission: RE | Payer: Self-pay | Source: Home / Self Care

## 2021-06-04 SURGERY — ARTHROPLASTY, HIP, TOTAL,POSTERIOR APPROACH
Anesthesia: Choice | Site: Hip | Laterality: Right

## 2021-09-24 ENCOUNTER — Other Ambulatory Visit: Payer: Self-pay

## 2021-09-24 ENCOUNTER — Other Ambulatory Visit
Admission: RE | Admit: 2021-09-24 | Discharge: 2021-09-24 | Disposition: A | Discharge status: Home / Self Care | Payer: BC Managed Care – PPO | Source: Ambulatory Visit | Attending: Orthopedic Surgery | Admitting: Orthopedic Surgery

## 2021-09-24 DIAGNOSIS — I10 Essential (primary) hypertension: Secondary | ICD-10-CM | POA: Insufficient documentation

## 2021-09-24 DIAGNOSIS — K219 Gastro-esophageal reflux disease without esophagitis: Secondary | ICD-10-CM | POA: Insufficient documentation

## 2021-09-24 DIAGNOSIS — Z01818 Encounter for other preprocedural examination: Secondary | ICD-10-CM | POA: Insufficient documentation

## 2021-09-24 DIAGNOSIS — M1611 Unilateral primary osteoarthritis, right hip: Secondary | ICD-10-CM | POA: Insufficient documentation

## 2021-09-24 HISTORY — DX: Unspecified osteoarthritis, unspecified site: M19.90

## 2021-09-24 HISTORY — DX: Type 2 diabetes mellitus without complications: E11.9

## 2021-09-24 HISTORY — DX: Gastro-esophageal reflux disease without esophagitis: K21.9

## 2021-09-24 HISTORY — DX: Anxiety disorder, unspecified: F41.9

## 2021-09-24 HISTORY — DX: Cardiac murmur, unspecified: R01.1

## 2021-09-24 HISTORY — DX: Personal history of urinary calculi: Z87.442

## 2021-09-24 LAB — URINALYSIS, ROUTINE W REFLEX MICROSCOPIC
Glucose, UA: NEGATIVE mg/dL
Hgb urine dipstick: NEGATIVE
Leukocytes,Ua: NEGATIVE
Nitrite: NEGATIVE
Protein, ur: 30 mg/dL — AB
Specific Gravity, Urine: >1.03 — ABNORMAL HIGH (ref 1.005–1.030)
pH: 5.5 (ref 5.0–8.0)

## 2021-09-24 LAB — COMPREHENSIVE METABOLIC PANEL
ALT: 13 U/L (ref 0–44)
AST: 14 U/L — ABNORMAL LOW (ref 15–41)
Albumin: 3.9 g/dL (ref 3.5–5.0)
Alkaline Phosphatase: 123 U/L (ref 38–126)
Anion gap: 9 (ref 5–15)
BUN: 19 mg/dL (ref 8–23)
CO2: 26 mmol/L (ref 22–32)
Calcium: 9.2 mg/dL (ref 8.9–10.3)
Chloride: 104 mmol/L (ref 98–111)
Creatinine, Ser: 0.59 mg/dL (ref 0.44–1.00)
GFR, Estimated: >60 mL/min (ref >60)
Glucose, Bld: 88 mg/dL (ref 70–99)
Potassium: 3.2 mmol/L — ABNORMAL LOW (ref 3.5–5.1)
Sodium: 139 mmol/L (ref 135–145)
Total Bilirubin: 0.7 mg/dL (ref 0.3–1.2)
Total Protein: 7.3 g/dL (ref 6.5–8.1)

## 2021-09-24 LAB — SURGICAL PCR SCREEN
MRSA, PCR: NEGATIVE
Staphylococcus aureus: NEGATIVE

## 2021-09-24 LAB — SEDIMENTATION RATE: Sed Rate: 43 mm/hr — ABNORMAL HIGH (ref 0–30)

## 2021-09-24 LAB — C-REACTIVE PROTEIN: CRP: 1.6 mg/dL — ABNORMAL HIGH (ref <1.0)

## 2021-09-24 LAB — CBC
HCT: 35.1 % — ABNORMAL LOW (ref 36.0–46.0)
Hemoglobin: 11.6 g/dL — ABNORMAL LOW (ref 12.0–15.0)
MCH: 27.3 pg (ref 26.0–34.0)
MCHC: 33 g/dL (ref 30.0–36.0)
MCV: 82.6 fL (ref 80.0–100.0)
Platelets: 317 10*3/uL (ref 150–400)
RBC: 4.25 MIL/uL (ref 3.87–5.11)
RDW: 14.6 % (ref 11.5–15.5)
WBC: 10.4 10*3/uL (ref 4.0–10.5)
nRBC: 0 % (ref 0.0–0.2)

## 2021-09-24 LAB — TYPE AND SCREEN
ABO/RH(D): B POS
Antibody Screen: NEGATIVE

## 2021-09-24 NOTE — Discharge Instructions (Signed)
Instructions after Total Hip Replacement     Latasha [name], Jr., M.D.     Dept. of Orthopaedics & Sports Medicine  [facility]  [address]  [name], [name]  [address]  Phone: [phone]   Fax: [phone]    DIET: . Drink plenty of non-alcoholic fluids. . Resume your normal diet. Include foods high in fiber.  ACTIVITY:  . You may use crutches or a walker with weight-bearing as tolerated, unless instructed otherwise. . You may be weaned off of the walker or crutches by your Physical Therapist.  . Do NOT reach below the level of your knees or cross your legs until allowed.    . Continue doing gentle exercises. Exercising will reduce the pain and swelling, increase motion, and prevent muscle weakness.   . Please continue to use the TED compression stockings for 6 weeks. You may remove the stockings at night, but should reapply them in the morning. . Do not drive or operate any equipment until instructed.  WOUND CARE:  . Continue to use ice packs periodically to reduce pain and swelling. . Keep the incision clean and dry. . You may bathe or shower after the staples are removed at the first office visit following surgery.  MEDICATIONS: . You may resume your regular medications. . Please take the pain medication as prescribed on the medication. . Do not take pain medication on an empty stomach. . You have been given a prescription for a blood thinner to prevent blood clots. Please take the medication as instructed. (NOTE: After completing a 2 week course of Lovenox, take one Enteric-coated aspirin once a day.) . Pain medications and iron supplements can cause constipation. Use a stool softener (Senokot or Colace) on a daily basis and a laxative (dulcolax or miralax) as needed. . Do not drive or drink alcoholic beverages when taking pain medications.  CALL THE OFFICE FOR: . Temperature above 101 degrees . Excessive bleeding or drainage on the dressing. . Excessive  swelling, coldness, or paleness of the toes. . Persistent nausea and vomiting.  FOLLOW-UP:  . You should have an appointment to return to the office in 6 weeks after surgery. . Arrangements have been made for continuation of Physical Therapy (either home therapy or outpatient therapy).     [facility] Department Directory         [email or web address]       [email or web address]          Cardiology  Appointments: [place] - [phone] [place] - [phone]  Endocrinology  Appointments: [place] - [phone] [place] - [phone]  Gastroenterology  Appointments: [place] - [phone] [place] - [phone]        General Surgery   Appointments: [place] - [phone]  Internal Medicine/Family Medicine  Appointments: [place] - [phone] [place] - [phone] [place] - [phone]  Metabolic and Weigh Loss Surgery  Appointments: [place] - [phone]        Neurology  Appointments: [place] - [phone] [place] - [phone]  Neurosurgery  Appointments: [place] - [phone]  Obstetrics & Gynecology  Appointments: [place] - [phone] [place] - [phone]        Pediatrics  Appointments: [place] - [phone] [place] - [phone]  Physiatry  Appointments: [place] -[phone]  Physical Therapy  Appointments: [place] - [phone] [place] - [phone]        Podiatry  Appointments: [place] - [phone] [place] - [phone]  Pulmonology  Appointments: [place] - [phone]  Rheumatology  Appointments: [place] - [phone]        [place] Location: [facility]  [address] [employer] [place], [place]  [address]  [place] Location: [facility] [address] [place], [place]  [address]  [place] Location: [facility] [address] [place], [place]  [address]    

## 2021-09-24 NOTE — Patient Instructions (Addendum)
Your procedure is scheduled on: Wednesday October 03, 2021. Report to Day Surgery inside [facility] 2nd floor To find out your arrival time please call [phone] between 1PM - 3PM on Tuesday October 02, 2021.  Remember: Instructions that are not followed completely may result in serious medical risk,  up to and including death, or upon the discretion of your surgeon and anesthesiologist your  surgery may need to be rescheduled.     _X__ 1. Do not eat food after midnight the night before your procedure.                 No chewing gum or hard candies. You may drink clear liquids up to 2 hours                 before you are scheduled to arrive for your surgery- DO not drink clear                 liquids within 2 hours of the start of your surgery.                 Clear Liquids include:  water, apple juice without pulp, clear Gatorade, G2 or                  Gatorade Zero (avoid Red/Purple/Blue), Black Coffee or Tea (Do not add                 anything to coffee or tea).  __X__2.   Complete the "Ensure Clear Pre-surgery Clear Carbohydrate Drink" provided to you, 2 hours before arrival. **If you are diabetic you will be provided with an alternative drink, Gatorade Zero or G2.  __X__3.  On the morning of surgery brush your teeth with toothpaste and water, you                may rinse your mouth with mouthwash if you wish.  Do not swallow any toothpaste of mouthwash.     _X__ 4.  No Alcohol for 24 hours before or after surgery.   _X__ 5.  Do Not Smoke or use e-cigarettes For 24 Hours Prior to Your Surgery.                 Do not use any chewable tobacco products for at least 6 hours prior to                 Surgery.  _X__  6.  Do not use any recreational drugs (marijuana, cocaine, heroin, ecstasy, MDMA or other)                For at least one week prior to your surgery.  Combination of these drugs with anesthesia                May have life threatening  results.  ____  7.  Bring all medications with you on the day of surgery if instructed.   __X__  8.  Notify your doctor if there is any change in your medical condition      (cold, fever, infections).     Do not wear jewelry, make-up, hairpins, clips or nail polish. Do not wear lotions, powders, or perfumes. You may wear deodorant. Do not shave 48 hours prior to surgery. Men may shave face and neck. Do not bring valuables to the hospital.    [facility] is not responsible for any belongings or valuables.  Contacts, dentures or bridgework may not be worn  into surgery. Leave your suitcase in the car. After surgery it may be brought to your room. For patients admitted to the hospital, discharge time is determined by your treatment team.   Patients discharged the day of surgery will not be allowed to drive home.   Make arrangements for someone to be with you for the first 24 hours of your Same Day Discharge.   __X__ Take these medicines the morning of surgery with A SIP OF WATER:    1. hydrOXYzine (ATARAX) 25 MG  2.   3.   4.  5.  6.  ____ Fleet Enema (as directed)   __X__ Use CHG Soap (or wipes) as directed  ____ Use Benzoyl Peroxide Gel as instructed  ____ Use inhalers on the day of surgery  ____ Stop metformin 2 days prior to surgery    ____ Take 1/2 of usual insulin dose the night before surgery. No insulin the morning          of surgery.   ____ Call your PCP, cardiologist, or Pulmonologist if taking Coumadin/Plavix/aspirin and ask when to stop before your surgery.   __X__ One Week prior to surgery- Stop Anti-inflammatories such as Ibuprofen, Aleve, Advil, Motrin, meloxicam (MOBIC), diclofenac, etodolac, ketorolac, Toradol, Daypro, piroxicam, Goody's or BC powders. OK TO USE TYLENOL IF NEEDED   __X__ One week prior to surgery- Stop ALL supplements until after surgery. Ascorbic Acid (VITAMIN C)   ____ Bring C-Pap to the hospital.    If you have any questions  regarding your pre-procedure instructions,  Please call Pre-admit Testing at [phone]

## 2021-09-25 ENCOUNTER — Telehealth: Payer: Self-pay | Admitting: Urgent Care

## 2021-09-25 DIAGNOSIS — T502X5A Adverse effect of carbonic-anhydrase inhibitors, benzothiadiazides and other diuretics, initial encounter: Secondary | ICD-10-CM

## 2021-09-25 DIAGNOSIS — Z01812 Encounter for preprocedural laboratory examination: Secondary | ICD-10-CM

## 2021-09-25 DIAGNOSIS — E876 Hypokalemia: Secondary | ICD-10-CM

## 2021-09-25 MED ORDER — POTASSIUM CHLORIDE CRYS ER 20 MEQ PO TBCR: 20.0000 meq | EXTENDED_RELEASE_TABLET | Freq: Two times a day (BID) | ORAL | 0 refills | Status: AC

## 2021-09-25 NOTE — Progress Notes (Signed)
[facility] Perioperative Services: Pre-Admission/Anesthesia Testing  Abnormal Lab Notification and Treatment Plan of Care   Date: 09/25/21  Name: Latasha [name] MRN:   [id number]  Re: Abnormal labs noted during PAT appointment   Notified:  Provider Name Provider Role Notification Mode  Francesco Sor, MD Orthopedics (Surgeon) Routed and/or faxed via Lannette Donath, PA-C Orthopedics (APP) Routed and/or faxed via [facility]   Clinical Information and Notes:  ABNORMAL LAB VALUE(S): Lab Results  Component Value Date   K 3.2 (L) 09/24/2021   Latasha [name] is scheduled for an elective RIGHT TOTAL HIP ARTHROPLASTY on 10/03/2021. In review of her medication reconciliation, it is noted that the patient is taking prescribed diuretic medications (furosemide and HCTZ). Please note, in efforts to promote a safe and effective anesthetic course, per current guidelines/standards set by the [facility] anesthesia team, the minimal acceptable K+ level for the patient to proceed with general anesthesia is 3.0 mmol/L. With that being said, if the patient drops any lower, her elective procedure will need to be postponed until K+ is better optimized. Spoke with orthopedic APP and surgical team would like err on the side of caution and better optimize the patient preoperatively.   Impression and Plan:  Tequia Wolman noted to be mildly HYPOkalemic at 3.2 mmol/L. Contacted by orthopedic APP advising that surgical team would prefer better preoperative optimization in efforts to avoid the potential of patient's case being cancelled. Of note, patient is already taking oral K+ supplement. I will proceed with ongoing management as follows:  Contact patient to discuss borderline low K+ levels and proactive approach to avoid further decline in her levels, which could potentially result in her surgery being postponed.  Increase oral K-Dur dose to 20 mEq BID starting tomorrow (09/26/2021) and  continuing until the day of her surgery (10/03/2021). This will give her a total of 7 days on the increased oral K+ supplement dose. Patient reporting that she does not have a sufficient supply to cover the increase dose. I will send in a NEW prescription for this medication:  Meds ordered this encounter  Medications   potassium chloride SA (KLOR-CON M) 20 MEQ tablet    Sig: Take 1 tablet (20 mEq total) by mouth 2 (two) times daily for 7 days. NOTE SHORT TERM DOSE INCREASE. THIS IS THE SAME MEDICATION AS YOU ALREADY HAVE, ONLY IT COVERS THE DOSE INCREASE. COMPLETE AS PRESCRIBED, THEN RETURN TO YOUR REGULAR DOSE.    Dispense:  14 tablet    Refill:  0   Order placed to re-check K+ level on the am of surgery to ensure optimization/correction of noted derangement.  Continue diuretic medications (both furosemide and HCTZ) as previously prescribed. Increased oral K+ dosing will account for associated losses.  Contact PCP post-operatively to discuss having labs rechecked and the potential need for increased oral K+ dosing daily vs. change in therapy to a K+ sparing diuretic.  Encourage patient to call PAT office, PCP, and/or Surgeon's office with any questions or concerns that arise between now and the time of surgery.  Send updated plan of care to Dr. Ernest Pine and Baldwin Jamaica, PA-C to make them aware of management of patient's mild hypokalemia.  Quentin Mulling, MSN, APRN, FNP-C, CEN [facility]  Peri-operative Services Nurse Practitioner Phone: [phone] 09/25/21 1:52 PM  NOTE: This note has been prepared using Dragon dictation software. Despite my best ability to proofread, there is always the potential that unintentional transcriptional errors may  still occur from this process.

## 2021-10-01 ENCOUNTER — Other Ambulatory Visit: Admission: RE | Admit: 2021-10-01 | Payer: BC Managed Care – PPO | Source: Ambulatory Visit

## 2021-10-02 ENCOUNTER — Other Ambulatory Visit
Admission: RE | Admit: 2021-10-02 | Discharge: 2021-10-02 | Disposition: A | Discharge status: Home / Self Care | Payer: BC Managed Care – PPO | Source: Ambulatory Visit | Attending: Orthopedic Surgery | Admitting: Orthopedic Surgery

## 2021-10-02 ENCOUNTER — Other Ambulatory Visit: Payer: Self-pay

## 2021-10-02 DIAGNOSIS — Z20822 Contact with and (suspected) exposure to covid-19: Secondary | ICD-10-CM | POA: Insufficient documentation

## 2021-10-02 DIAGNOSIS — Z01812 Encounter for preprocedural laboratory examination: Secondary | ICD-10-CM | POA: Insufficient documentation

## 2021-10-02 LAB — SARS CORONAVIRUS 2 (TAT 6-24 HRS): SARS Coronavirus 2: NEGATIVE

## 2021-10-03 ENCOUNTER — Observation Stay
Admission: RE | Admit: 2021-10-03 | Discharge: 2021-10-05 | Disposition: A | Discharge status: Home / Self Care | Payer: BC Managed Care – PPO | Attending: Orthopedic Surgery | Admitting: Orthopedic Surgery

## 2021-10-03 ENCOUNTER — Ambulatory Visit: Payer: BC Managed Care – PPO | Admitting: Urgent Care

## 2021-10-03 ENCOUNTER — Encounter: Payer: Self-pay | Admitting: Orthopedic Surgery

## 2021-10-03 ENCOUNTER — Observation Stay: Payer: BC Managed Care – PPO

## 2021-10-03 ENCOUNTER — Other Ambulatory Visit: Payer: Self-pay

## 2021-10-03 ENCOUNTER — Encounter
Admission: RE | Disposition: A | Discharge status: Home / Self Care | Payer: Self-pay | Source: Home / Self Care | Attending: Orthopedic Surgery

## 2021-10-03 DIAGNOSIS — Z96649 Presence of unspecified artificial hip joint: Secondary | ICD-10-CM

## 2021-10-03 DIAGNOSIS — Z79899 Other long term (current) drug therapy: Secondary | ICD-10-CM | POA: Diagnosis not present

## 2021-10-03 DIAGNOSIS — I10 Essential (primary) hypertension: Secondary | ICD-10-CM | POA: Diagnosis not present

## 2021-10-03 DIAGNOSIS — Z87891 Personal history of nicotine dependence: Secondary | ICD-10-CM | POA: Insufficient documentation

## 2021-10-03 DIAGNOSIS — M1611 Unilateral primary osteoarthritis, right hip: Secondary | ICD-10-CM | POA: Diagnosis present

## 2021-10-03 DIAGNOSIS — Z96641 Presence of right artificial hip joint: Secondary | ICD-10-CM

## 2021-10-03 DIAGNOSIS — K219 Gastro-esophageal reflux disease without esophagitis: Secondary | ICD-10-CM

## 2021-10-03 HISTORY — PX: TOTAL HIP ARTHROPLASTY: SHX124

## 2021-10-03 LAB — ABO/RH: ABO/RH(D): B POS

## 2021-10-03 LAB — POCT I-STAT, CHEM 8
BUN: 19 mg/dL (ref 8–23)
Calcium, Ion: 1.27 mmol/L (ref 1.15–1.40)
Chloride: 105 mmol/L (ref 98–111)
Creatinine, Ser: 0.7 mg/dL (ref 0.44–1.00)
Glucose, Bld: 84 mg/dL (ref 70–99)
HCT: 36 % (ref 36.0–46.0)
Hemoglobin: 12.2 g/dL (ref 12.0–15.0)
Potassium: 3.6 mmol/L (ref 3.5–5.1)
Sodium: 140 mmol/L (ref 135–145)
TCO2: 26 mmol/L (ref 22–32)

## 2021-10-03 SURGERY — ARTHROPLASTY, HIP, TOTAL,POSTERIOR APPROACH
Anesthesia: Spinal | Site: Hip | Laterality: Right

## 2021-10-03 MED ORDER — ASCORBIC ACID 500 MG PO TABS
1000.0000 mg | ORAL_TABLET | Freq: Every day | ORAL | Status: DC
  Administered 2021-10-04 – 2021-10-05 (×2): 1000 mg via ORAL
  Filled 2021-10-03 (×3): qty 2

## 2021-10-03 MED ORDER — ACETAMINOPHEN 500 MG PO TABS
ORAL_TABLET | ORAL | Status: AC
  Filled 2021-10-03: qty 2

## 2021-10-03 MED ORDER — ADULT MULTIVITAMIN W/MINERALS CH
1.0000 | ORAL_TABLET | Freq: Every day | ORAL | Status: DC
  Administered 2021-10-04 – 2021-10-05 (×2): 1 via ORAL
  Filled 2021-10-03 (×2): qty 1

## 2021-10-03 MED ORDER — METOCLOPRAMIDE HCL 10 MG PO TABS
10.0000 mg | ORAL_TABLET | Freq: Three times a day (TID) | ORAL | Status: DC
  Administered 2021-10-03 – 2021-10-05 (×6): 10 mg via ORAL
  Filled 2021-10-03 (×5): qty 1

## 2021-10-03 MED ORDER — CELECOXIB 200 MG PO CAPS
ORAL_CAPSULE | ORAL | Status: AC
  Administered 2021-10-03: 400 mg via ORAL
  Filled 2021-10-03: qty 1

## 2021-10-03 MED ORDER — POTASSIUM CHLORIDE CRYS ER 20 MEQ PO TBCR
20.0000 meq | EXTENDED_RELEASE_TABLET | Freq: Every day | ORAL | Status: DC
  Administered 2021-10-05: 20 meq via ORAL
  Filled 2021-10-03: qty 1

## 2021-10-03 MED ORDER — CEFAZOLIN SODIUM-DEXTROSE 2-4 GM/100ML-% IV SOLN
2.0000 g | INTRAVENOUS | Status: AC
  Administered 2021-10-03: 2 g via INTRAVENOUS

## 2021-10-03 MED ORDER — PHENOL 1.4 % MT LIQD
1.0000 | OROMUCOSAL | Status: DC | PRN
  Filled 2021-10-03: qty 177

## 2021-10-03 MED ORDER — CEFAZOLIN SODIUM-DEXTROSE 2-4 GM/100ML-% IV SOLN
INTRAVENOUS | Status: AC
  Filled 2021-10-03: qty 100

## 2021-10-03 MED ORDER — GLYCOPYRROLATE 0.2 MG/ML IJ SOLN
INTRAMUSCULAR | Status: DC | PRN
  Administered 2021-10-03: .2 mg via INTRAVENOUS

## 2021-10-03 MED ORDER — PROMETHAZINE HCL 25 MG/ML IJ SOLN: 6.2500 mg | INTRAMUSCULAR | Status: DC | PRN

## 2021-10-03 MED ORDER — APREPITANT 40 MG PO CAPS
ORAL_CAPSULE | ORAL | Status: AC
  Administered 2021-10-03: 40 mg via ORAL
  Filled 2021-10-03: qty 1

## 2021-10-03 MED ORDER — ONDANSETRON HCL 4 MG PO TABS: 4.0000 mg | ORAL_TABLET | Freq: Four times a day (QID) | ORAL | Status: DC | PRN

## 2021-10-03 MED ORDER — TRAMADOL HCL 50 MG PO TABS: 50.0000 mg | ORAL_TABLET | ORAL | Status: DC | PRN

## 2021-10-03 MED ORDER — SODIUM CHLORIDE 0.9 % IR SOLN
Status: DC | PRN
  Administered 2021-10-03: 3000 mL

## 2021-10-03 MED ORDER — HYDROMORPHONE HCL 1 MG/ML IJ SOLN: 0.5000 mg | INTRAMUSCULAR | Status: DC | PRN

## 2021-10-03 MED ORDER — ACETAMINOPHEN 10 MG/ML IV SOLN
INTRAVENOUS | Status: AC
  Filled 2021-10-03: qty 100

## 2021-10-03 MED ORDER — MAGNESIUM HYDROXIDE 400 MG/5ML PO SUSP
30.0000 mL | Freq: Every day | ORAL | Status: DC
  Administered 2021-10-05: 30 mL via ORAL
  Filled 2021-10-03: qty 30

## 2021-10-03 MED ORDER — ORAL CARE MOUTH RINSE: 15.0000 mL | Freq: Once | OROMUCOSAL | Status: AC

## 2021-10-03 MED ORDER — BUPIVACAINE HCL (PF) 0.5 % IJ SOLN
INTRAMUSCULAR | Status: DC | PRN
Start: 2021-10-03 — End: 2021-10-03
  Administered 2021-10-03: 3 mL via INTRATHECAL

## 2021-10-03 MED ORDER — LACTATED RINGERS IV SOLN: INTRAVENOUS | Status: DC

## 2021-10-03 MED ORDER — MENTHOL 3 MG MT LOZG
1.0000 | LOZENGE | OROMUCOSAL | Status: DC | PRN
  Filled 2021-10-03: qty 9

## 2021-10-03 MED ORDER — CELECOXIB 200 MG PO CAPS
ORAL_CAPSULE | ORAL | Status: AC
  Filled 2021-10-03: qty 1

## 2021-10-03 MED ORDER — OXYCODONE HCL 5 MG PO TABS
10.0000 mg | ORAL_TABLET | ORAL | Status: DC | PRN
  Administered 2021-10-04: 10 mg via ORAL

## 2021-10-03 MED ORDER — SENNOSIDES-DOCUSATE SODIUM 8.6-50 MG PO TABS
1.0000 | ORAL_TABLET | Freq: Two times a day (BID) | ORAL | Status: DC
  Administered 2021-10-03 – 2021-10-05 (×4): 1 via ORAL
  Filled 2021-10-03 (×5): qty 1

## 2021-10-03 MED ORDER — TRANEXAMIC ACID-NACL 1000-0.7 MG/100ML-% IV SOLN
INTRAVENOUS | Status: AC
  Filled 2021-10-03: qty 100

## 2021-10-03 MED ORDER — TRANEXAMIC ACID-NACL 1000-0.7 MG/100ML-% IV SOLN
1000.0000 mg | INTRAVENOUS | Status: AC
  Administered 2021-10-03: 1000 mg via INTRAVENOUS

## 2021-10-03 MED ORDER — PHENYLEPHRINE HCL (PRESSORS) 10 MG/ML IV SOLN
INTRAVENOUS | Status: DC | PRN
  Administered 2021-10-03: 160 ug via INTRAVENOUS
  Administered 2021-10-03 (×2): 80 ug via INTRAVENOUS

## 2021-10-03 MED ORDER — CHLORHEXIDINE GLUCONATE 4 % EX LIQD
60.0000 mL | Freq: Once | CUTANEOUS | Status: AC
  Administered 2021-10-03: 4 via TOPICAL

## 2021-10-03 MED ORDER — FENTANYL CITRATE (PF) 100 MCG/2ML IJ SOLN
25.0000 ug | INTRAMUSCULAR | Status: DC | PRN
  Administered 2021-10-03: 25 ug via INTRAVENOUS

## 2021-10-03 MED ORDER — PHENYLEPHRINE HCL-NACL 20-0.9 MG/250ML-% IV SOLN
INTRAVENOUS | Status: DC | PRN
  Administered 2021-10-03: 20 ug/min via INTRAVENOUS

## 2021-10-03 MED ORDER — CEFAZOLIN SODIUM-DEXTROSE 2-4 GM/100ML-% IV SOLN
2.0000 g | Freq: Four times a day (QID) | INTRAVENOUS | Status: AC
  Administered 2021-10-03: 2 g via INTRAVENOUS

## 2021-10-03 MED ORDER — TIZANIDINE HCL 2 MG PO TABS
2.0000 mg | ORAL_TABLET | Freq: Four times a day (QID) | ORAL | Status: DC | PRN
  Administered 2021-10-03: 2 mg via ORAL
  Filled 2021-10-03 (×2): qty 1

## 2021-10-03 MED ORDER — BISACODYL 10 MG RE SUPP
10.0000 mg | Freq: Every day | RECTAL | Status: DC | PRN
  Administered 2021-10-05: 10 mg via RECTAL
  Filled 2021-10-03 (×2): qty 1

## 2021-10-03 MED ORDER — DIPHENHYDRAMINE HCL 12.5 MG/5ML PO ELIX
12.5000 mg | ORAL_SOLUTION | ORAL | Status: DC | PRN
  Filled 2021-10-03: qty 10

## 2021-10-03 MED ORDER — ONDANSETRON HCL 4 MG/2ML IJ SOLN
INTRAMUSCULAR | Status: DC | PRN
  Administered 2021-10-03: 4 mg via INTRAVENOUS

## 2021-10-03 MED ORDER — ACETAMINOPHEN 325 MG PO TABS
325.0000 mg | ORAL_TABLET | Freq: Four times a day (QID) | ORAL | Status: DC | PRN
  Administered 2021-10-04 – 2021-10-05 (×2): 650 mg via ORAL
  Filled 2021-10-03 (×2): qty 2

## 2021-10-03 MED ORDER — METOCLOPRAMIDE HCL 10 MG PO TABS
ORAL_TABLET | ORAL | Status: AC
  Filled 2021-10-03: qty 1

## 2021-10-03 MED ORDER — EPHEDRINE 5 MG/ML INJ
INTRAVENOUS | Status: AC
  Filled 2021-10-03: qty 5

## 2021-10-03 MED ORDER — DEXAMETHASONE SODIUM PHOSPHATE 10 MG/ML IJ SOLN
INTRAMUSCULAR | Status: AC
  Administered 2021-10-03: 8 mg via INTRAVENOUS
  Filled 2021-10-03: qty 1

## 2021-10-03 MED ORDER — GABAPENTIN 300 MG PO CAPS
ORAL_CAPSULE | ORAL | Status: AC
  Administered 2021-10-03: 300 mg via ORAL
  Filled 2021-10-03: qty 1

## 2021-10-03 MED ORDER — GABAPENTIN 300 MG PO CAPS: 300.0000 mg | ORAL_CAPSULE | Freq: Once | ORAL | Status: AC

## 2021-10-03 MED ORDER — TRANEXAMIC ACID-NACL 1000-0.7 MG/100ML-% IV SOLN
1000.0000 mg | Freq: Once | INTRAVENOUS | Status: AC
  Administered 2021-10-03: 1000 mg via INTRAVENOUS

## 2021-10-03 MED ORDER — ACETAMINOPHEN 10 MG/ML IV SOLN
INTRAVENOUS | Status: DC | PRN
  Administered 2021-10-03: 1000 mg via INTRAVENOUS

## 2021-10-03 MED ORDER — SODIUM CHLORIDE 0.9 % IR SOLN
Status: DC | PRN
  Administered 2021-10-03: 300 mL

## 2021-10-03 MED ORDER — PROPOFOL 500 MG/50ML IV EMUL
INTRAVENOUS | Status: DC | PRN
  Administered 2021-10-03: 50 ug/kg/min via INTRAVENOUS

## 2021-10-03 MED ORDER — OXYCODONE HCL 5 MG PO TABS: 5.0000 mg | ORAL_TABLET | Freq: Once | ORAL | Status: DC | PRN

## 2021-10-03 MED ORDER — LORATADINE 10 MG PO TABS
10.0000 mg | ORAL_TABLET | Freq: Every day | ORAL | Status: DC
  Administered 2021-10-03 – 2021-10-05 (×2): 10 mg via ORAL
  Filled 2021-10-03 (×3): qty 1

## 2021-10-03 MED ORDER — MIDAZOLAM HCL 5 MG/5ML IJ SOLN
INTRAMUSCULAR | Status: DC | PRN
  Administered 2021-10-03 (×2): 1 mg via INTRAVENOUS

## 2021-10-03 MED ORDER — HYDROXYZINE HCL 25 MG PO TABS
25.0000 mg | ORAL_TABLET | Freq: Every day | ORAL | Status: DC | PRN
  Administered 2021-10-03: 25 mg via ORAL
  Filled 2021-10-03 (×2): qty 1

## 2021-10-03 MED ORDER — ENOXAPARIN SODIUM 30 MG/0.3ML IJ SOSY
30.0000 mg | PREFILLED_SYRINGE | Freq: Two times a day (BID) | INTRAMUSCULAR | Status: DC
  Administered 2021-10-04 – 2021-10-05 (×3): 30 mg via SUBCUTANEOUS
  Filled 2021-10-03 (×4): qty 0.3

## 2021-10-03 MED ORDER — PROPOFOL 10 MG/ML IV BOLUS
INTRAVENOUS | Status: DC | PRN
  Administered 2021-10-03: 50 mg via INTRAVENOUS
  Administered 2021-10-03: 60 mg via INTRAVENOUS
  Administered 2021-10-03: 50 mg via INTRAVENOUS
  Administered 2021-10-03: 40 mg via INTRAVENOUS

## 2021-10-03 MED ORDER — DEXAMETHASONE SODIUM PHOSPHATE 10 MG/ML IJ SOLN: 8.0000 mg | Freq: Once | INTRAMUSCULAR | Status: AC

## 2021-10-03 MED ORDER — MIDAZOLAM HCL 2 MG/2ML IJ SOLN
INTRAMUSCULAR | Status: AC
  Filled 2021-10-03: qty 2

## 2021-10-03 MED ORDER — CELECOXIB 200 MG PO CAPS
200.0000 mg | ORAL_CAPSULE | Freq: Two times a day (BID) | ORAL | Status: DC
  Administered 2021-10-03 – 2021-10-05 (×3): 200 mg via ORAL
  Filled 2021-10-03 (×2): qty 1

## 2021-10-03 MED ORDER — ACETAMINOPHEN 10 MG/ML IV SOLN: 1000.0000 mg | Freq: Once | INTRAVENOUS | Status: DC | PRN

## 2021-10-03 MED ORDER — OXYCODONE HCL 5 MG PO TABS
5.0000 mg | ORAL_TABLET | ORAL | Status: DC | PRN
  Administered 2021-10-04 – 2021-10-05 (×3): 5 mg via ORAL
  Filled 2021-10-03 (×2): qty 1

## 2021-10-03 MED ORDER — SODIUM CHLORIDE 0.9 % IV SOLN: INTRAVENOUS | Status: DC

## 2021-10-03 MED ORDER — FLEET ENEMA 7-19 GM/118ML RE ENEM: 1.0000 | ENEMA | Freq: Once | RECTAL | Status: DC | PRN

## 2021-10-03 MED ORDER — OXYCODONE HCL 5 MG/5ML PO SOLN: 5.0000 mg | Freq: Once | ORAL | Status: DC | PRN

## 2021-10-03 MED ORDER — APREPITANT 40 MG PO CAPS: 40.0000 mg | ORAL_CAPSULE | Freq: Once | ORAL | Status: AC

## 2021-10-03 MED ORDER — PROPOFOL 1000 MG/100ML IV EMUL
INTRAVENOUS | Status: AC
  Filled 2021-10-03: qty 100

## 2021-10-03 MED ORDER — CELECOXIB 200 MG PO CAPS: 400.0000 mg | ORAL_CAPSULE | Freq: Once | ORAL | Status: AC

## 2021-10-03 MED ORDER — CHLORHEXIDINE GLUCONATE 0.12 % MT SOLN
OROMUCOSAL | Status: AC
  Administered 2021-10-03: 15 mL via OROMUCOSAL
  Filled 2021-10-03: qty 15

## 2021-10-03 MED ORDER — PRONTOSAN WOUND IRRIGATION OPTIME
TOPICAL | Status: DC | PRN
  Administered 2021-10-03: 1

## 2021-10-03 MED ORDER — CHLORHEXIDINE GLUCONATE 0.12 % MT SOLN: 15.0000 mL | Freq: Once | OROMUCOSAL | Status: AC

## 2021-10-03 MED ORDER — ONDANSETRON HCL 4 MG/2ML IJ SOLN: 4.0000 mg | Freq: Four times a day (QID) | INTRAMUSCULAR | Status: DC | PRN

## 2021-10-03 MED ORDER — PHENYLEPHRINE HCL (PRESSORS) 10 MG/ML IV SOLN
INTRAVENOUS | Status: AC
  Filled 2021-10-03: qty 1

## 2021-10-03 MED ORDER — BISOPROLOL-HYDROCHLOROTHIAZIDE 10-6.25 MG PO TABS
1.0000 | ORAL_TABLET | Freq: Every day | ORAL | Status: DC
  Administered 2021-10-03 – 2021-10-05 (×3): 1 via ORAL
  Filled 2021-10-03 (×3): qty 1

## 2021-10-03 MED ORDER — FERROUS SULFATE 325 (65 FE) MG PO TABS
325.0000 mg | ORAL_TABLET | Freq: Two times a day (BID) | ORAL | Status: DC
  Administered 2021-10-04 – 2021-10-05 (×4): 325 mg via ORAL
  Filled 2021-10-03 (×6): qty 1

## 2021-10-03 MED ORDER — PANTOPRAZOLE SODIUM 40 MG PO TBEC
40.0000 mg | DELAYED_RELEASE_TABLET | Freq: Two times a day (BID) | ORAL | Status: DC
  Administered 2021-10-03 – 2021-10-05 (×4): 40 mg via ORAL
  Filled 2021-10-03 (×5): qty 1

## 2021-10-03 MED ORDER — FENTANYL CITRATE (PF) 100 MCG/2ML IJ SOLN
INTRAMUSCULAR | Status: AC
  Filled 2021-10-03: qty 2

## 2021-10-03 MED ORDER — ALUM & MAG HYDROXIDE-SIMETH 200-200-20 MG/5ML PO SUSP: 30.0000 mL | ORAL | Status: DC | PRN

## 2021-10-03 MED ORDER — DEXMEDETOMIDINE (PRECEDEX) IN NS 20 MCG/5ML (4 MCG/ML) IV SYRINGE
PREFILLED_SYRINGE | INTRAVENOUS | Status: DC | PRN
  Administered 2021-10-03: 8 ug via INTRAVENOUS
  Administered 2021-10-03 (×2): 12 ug via INTRAVENOUS

## 2021-10-03 MED ORDER — ACETAMINOPHEN 10 MG/ML IV SOLN
1000.0000 mg | Freq: Four times a day (QID) | INTRAVENOUS | Status: AC
  Administered 2021-10-03 – 2021-10-04 (×2): 1000 mg via INTRAVENOUS
  Filled 2021-10-03: qty 100

## 2021-10-03 MED ORDER — ENSURE PRE-SURGERY PO LIQD
296.0000 mL | Freq: Once | ORAL | Status: AC
  Administered 2021-10-03: 296 mL via ORAL
  Filled 2021-10-03: qty 296

## 2021-10-03 MED ORDER — NEOMYCIN-POLYMYXIN B GU 40-200000 IR SOLN
Status: AC
  Filled 2021-10-03: qty 2

## 2021-10-03 MED ORDER — EPHEDRINE SULFATE (PRESSORS) 50 MG/ML IJ SOLN
INTRAMUSCULAR | Status: DC | PRN
  Administered 2021-10-03: 5 mg via INTRAVENOUS

## 2021-10-03 SURGICAL SUPPLY — 62 items
BLADE DRUM FLTD (BLADE) ×2 IMPLANT
BLADE SAW 90X25X1.19 OSCILLAT (BLADE) ×2 IMPLANT
CARTRIDGE OIL MAESTRO DRILL (MISCELLANEOUS) ×1 IMPLANT
DIFFUSER DRILL AIR PNEUMATIC (MISCELLANEOUS) ×2 IMPLANT
DRAPE 3/4 80X56 (DRAPES) ×2 IMPLANT
DRAPE INCISE IOBAN 66X60 STRL (DRAPES) ×2 IMPLANT
DRSG DERMACEA 8X12 NADH (GAUZE/BANDAGES/DRESSINGS) ×2 IMPLANT
DRSG MEPILEX SACRM 8.7X9.8 (GAUZE/BANDAGES/DRESSINGS) ×2 IMPLANT
DRSG OPSITE POSTOP 4X12 (GAUZE/BANDAGES/DRESSINGS) ×1 IMPLANT
DRSG OPSITE POSTOP 4X14 (GAUZE/BANDAGES/DRESSINGS) ×1 IMPLANT
DRSG TEGADERM 4X4.75 (GAUZE/BANDAGES/DRESSINGS) ×2 IMPLANT
DURAPREP 26ML APPLICATOR (WOUND CARE) ×3 IMPLANT
ELECT CAUTERY BLADE 6.4 (BLADE) ×2 IMPLANT
ELECT REM PT RETURN 9FT ADLT (ELECTROSURGICAL) ×2
ELECTRODE REM PT RTRN 9FT ADLT (ELECTROSURGICAL) ×1 IMPLANT
GAUZE 4X4 16PLY ~~LOC~~+RFID DBL (SPONGE) ×1 IMPLANT
GLOVE SURG ENC MOIS LTX SZ7.5 (GLOVE) ×4 IMPLANT
GLOVE SURG ENC TEXT LTX SZ7.5 (GLOVE) ×4 IMPLANT
GLOVE SURG UNDER LTX SZ8 (GLOVE) ×2 IMPLANT
GLOVE SURG UNDER POLY LF SZ7.5 (GLOVE) ×2 IMPLANT
GOWN STRL REUS W/ TWL LRG LVL3 (GOWN DISPOSABLE) ×2 IMPLANT
GOWN STRL REUS W/ TWL XL LVL3 (GOWN DISPOSABLE) ×1 IMPLANT
GOWN STRL REUS W/TWL LRG LVL3 (GOWN DISPOSABLE) ×4
GOWN STRL REUS W/TWL XL LVL3 (GOWN DISPOSABLE) ×2
HEAD M SROM 36MM PLUS 1.5 (Hips) IMPLANT
HEMOVAC 400CC 10FR (MISCELLANEOUS) ×2 IMPLANT
HOLDER FOLEY CATH W/STRAP (MISCELLANEOUS) ×2 IMPLANT
HOLSTER ELECTROSUGICAL PENCIL (MISCELLANEOUS) ×4 IMPLANT
IV NS IRRIG 3000ML ARTHROMATIC (IV SOLUTION) ×2 IMPLANT
KIT PEG BOARD PINK (KITS) ×2 IMPLANT
KIT TURNOVER KIT A (KITS) ×2 IMPLANT
LINER NEUTRAL 52X36MM PLUS 4 (Liner) ×1 IMPLANT
MANIFOLD NEPTUNE II (INSTRUMENTS) ×4 IMPLANT
NDL SAFETY ECLIPSE 18X1.5 (NEEDLE) ×1 IMPLANT
NEEDLE HYPO 18GX1.5 SHARP (NEEDLE) ×1
NS IRRIG 500ML POUR BTL (IV SOLUTION) ×2 IMPLANT
OIL CARTRIDGE MAESTRO DRILL (MISCELLANEOUS) ×2
PACK HIP PROSTHESIS (MISCELLANEOUS) ×2 IMPLANT
PENCIL SMOKE EVACUATOR COATED (MISCELLANEOUS) ×2 IMPLANT
PIN SECTOR W/GRIP ACE CUP 52MM (Hips) ×1 IMPLANT
PULSAVAC PLUS IRRIG FAN TIP (DISPOSABLE) ×2
SCREW 6.5MMX25MM (Screw) ×1 IMPLANT
SOL PREP PVP 2OZ (MISCELLANEOUS) ×2
SOLUTION PREP PVP 2OZ (MISCELLANEOUS) ×1 IMPLANT
SOLUTION PRONTOSAN WOUND 350ML (IRRIGATION / IRRIGATOR) ×2 IMPLANT
SPONGE DRAIN TRACH 4X4 STRL 2S (GAUZE/BANDAGES/DRESSINGS) ×2 IMPLANT
SPONGE T-LAP 18X18 ~~LOC~~+RFID (SPONGE) ×8 IMPLANT
SROM M HEAD 36MM PLUS 1.5 (Hips) ×2 IMPLANT
STAPLER SKIN PROX 35W (STAPLE) ×2 IMPLANT
STEM FEM CMNTLSS LG AML 13.5 (Hips) ×1 IMPLANT
SUT ETHIBOND #5 BRAIDED 30INL (SUTURE) ×2 IMPLANT
SUT VIC AB 0 CT1 36 (SUTURE) ×2 IMPLANT
SUT VIC AB 1 CT1 36 (SUTURE) ×4 IMPLANT
SUT VIC AB 2-0 CT1 27 (SUTURE) ×2
SUT VIC AB 2-0 CT1 TAPERPNT 27 (SUTURE) ×1 IMPLANT
SYR 20ML LL LF (SYRINGE) ×2 IMPLANT
TAPE CLOTH 3X10 WHT NS LF (GAUZE/BANDAGES/DRESSINGS) ×2 IMPLANT
TAPE TRANSPORE STRL 2 31045 (GAUZE/BANDAGES/DRESSINGS) ×2 IMPLANT
TIP FAN IRRIG PULSAVAC PLUS (DISPOSABLE) ×1 IMPLANT
TOWEL OR 17X26 4PK STRL BLUE (TOWEL DISPOSABLE) ×2 IMPLANT
TRAY FOLEY MTR SLVR 16FR STAT (SET/KITS/TRAYS/PACK) ×2 IMPLANT
WATER STERILE IRR 1000ML POUR (IV SOLUTION) ×1 IMPLANT

## 2021-10-03 NOTE — H&P (Signed)
ORTHOPAEDIC HISTORY & PHYSICAL Michelene Gardener, [place] - 09/25/2021 11:00 AM EST Formatting of this note is different from the original. [facility] Chief Complaint:   Chief Complaint  Patient presents with   Hip Pain  H & P RIGHT HIP   History of Present Illness:   Latasha [name] is a 61 y.o. female that presents to clinic today for her preoperative history and evaluation. Patient presents unaccompanied. The patient is scheduled to undergo a right total hip arthroplasty on 10/03/21 by Dr. Ernest Pine. Her pain began around 1 year ago. The pain is located in the right hip and groin. She describes her pain as worse with weightbearing. She reports associated low back pain with some radiation of pain into the buttocks and both anterolateral thighs. She denies associated numbness or tingling.   The patient's symptoms have progressed to the point that they decrease her quality of life. The patient has previously undergone conservative treatment including NSAIDS and activity modification without adequate control of her symptoms.  Of note, pre-operative labs do show mild hypokalemia.   Patient lives with her mother who is currently in a wheelchair. She states her mother is recovering from a broken leg at this time. She will inquire for friends to help post-operatively. Denies history of lumbar surgery, denies history of blood clots, denies significant cardiac history.   Patient states she does not want to take oxycodone post-operatively if possible. She would prefer to take Tylenol 3. Also states she would prefer not to have a spinal pre-operatively.   Past Medical, Surgical, Family, Social History, Allergies, Medications:   Past Medical History:  Past Medical History:  Diagnosis Date   Allergic state   Arthritis   Endometriosis   Hypertension   Osteoporosis, post-menopausal   Surgical menopause   Past Surgical History:  Past Surgical History:   Procedure Laterality Date   HYSTERECTOMY   TONSILLECTOMY   Current Medications:  Current Outpatient Medications  Medication Sig Dispense Refill   acetaminophen (TYLENOL) 650 MG ER tablet Take 650 mg by mouth every 8 (eight) hours as needed for Pain   acetaminophen-codeine (TYLENOL #3) 300-30 mg tablet Take 1 tablet by mouth 3 (three) times daily as needed   ALBUTEROL INHAL Inhale into the lungs As needed   ascorbic acid, vitamin C, (VITAMIN C) 500 MG tablet Take 1,000 mg by mouth once daily   bisoproloL-hydrochlorothiazide (ZIAC) 10-6.25 mg tablet Take 1 tablet by mouth once daily 30 tablet 0   cetirizine (ZYRTEC) 10 MG tablet Take 10 mg by mouth once daily   diclofenac (VOLTAREN) 1 % topical gel Apply 2 g topically once daily   diclofenac (VOLTAREN) 75 MG EC tablet Take 75 mg by mouth 2 (two) times daily with meals   EUCRISA 2 % Oint APPLY TOPICALLY TO AFFECTED AREA (ECZEMA ON HANDS AND ARMS) TWICE DAILY UNTIL CLEAR   folic acid/multivit-min/lutein (CENTRUM SILVER ORAL) Take 1 caplet by mouth once daily   FUROsemide (LASIX) 20 MG tablet Take 1 tablet (20 mg total) by mouth once daily 90 tablet 3   hydrOXYzine (ATARAX) 25 MG tablet Take 25 mg by mouth once daily as needed FOR ITCHING   ibuprofen (MOTRIN) 800 MG tablet Take 800 mg by mouth every 6 (six) hours as needed for Pain   ibuprofen-acetaminophen 125-250 mg Tab Take 2 tablets by mouth every 6 (six) hours as needed   lidocaine (SALONPAS) 4 % patch Place 1 patch onto the skin daily  pantoprazole (PROTONIX) 40 MG DR tablet Take 1 tablet (40 mg total) by mouth once daily 90 tablet 3   potassium chloride (KLOR-CON) 8 MEQ ER tablet Take 1 tablet (8 mEq total) by mouth once daily 90 tablet 3   tiZANidine (ZANAFLEX) 2 MG capsule Take 1 capsule (2 mg total) by mouth 2 (two) times daily as needed for Muscle spasms Per patient   No current facility-administered medications for this visit.   Allergies:  Allergies  Allergen Reactions    Avelox [Moxifloxacin] Other (See Comments)  Does not remember - onset 01/15/2010   Social History:  Social History   Socioeconomic History   Marital status: Married  Spouse name: Latasha [name]   Number of children: 2   Years of education: 12  Occupational History   Occupation: Public affairs consultantull-time- Teacher Asst  Tobacco Use   Smoking status: Former  Packs/day: 0.50  Years: 15.00  Pack years: 7.50  Types: Cigarettes  Quit date: 1999  Years since quitting: 24.0   Smokeless tobacco: Never  Vaping Use   Vaping Use: Never used  Substance and Sexual Activity   Alcohol use: Yes  Alcohol/week: 0.0 standard drinks  Comment: Rarely   Drug use: No   Sexual activity: Yes  Partners: Male   Family History:  Family History  Problem Relation Age of Onset   High blood pressure (Hypertension) Mother   Osteoporosis (Thinning of bones) Mother   High blood pressure (Hypertension) Father   High blood pressure (Hypertension) Other  Sibling   Allergies Other  Sibling   Cancer Sister  appendiceal   Review of Systems:   A 10+ ROS was performed, reviewed, and the pertinent orthopaedic findings are documented in the HPI.   Physical Examination:   BP (!) 140/84 (BP Location: Left upper arm, Patient Position: Sitting, BP Cuff Size: Large Adult)   Ht 157.5 cm (5\' 2" )   Wt 89.5 kg (197 lb 6.4 oz)   BMI 36.10 kg/m   Patient is a well-developed, well-nourished female in no acute distress. Patient has normal mood and affect. Patient is alert and oriented to person, place, and time.   HEENT: Atraumatic, normocephalic. Pupils equal and reactive to light. Extraocular motion intact. Noninjected sclera.  Cardiovascular: Regular rate and rhythm, with no murmurs, rubs, or gallops. Distal pulses palpable.  Respiratory: Lungs clear to auscultation bilaterally.   Right Hip: Pelvic tilt: Negative Limb lengths: Equal with the patient standing Soft tissue swelling: Negative Erythema: Negative Crepitance:  Negative Tenderness: Greater trochanter is nontender to palpation. Moderate pain is elicited by axial compression or extremes of rotation. Atrophy: No atrophy. Fair to good hip flexor and abductor strength. Range of Motion: EXT/FLEX: 0/0/100 ADD/ABD: 20/0/20 IR/ER: 20/0/20  Sensation is intact over the saphenous, lateral cutaneous, superficial fibular, and deep fibular nerve distributions.  Tests Performed/Reviewed:  X-rays  Anteroposterior view of the pelvis as well as anteroposterior and lateral views of the right hip were obtained. Images reveal complete loss of femoral acetabular joint space with significant subchondral sclerosis and osteophyte formation. Mild deformation of the femoral head with cystic formation is noted.  I personally ordered and interpreted today's x-rays.  Impression:   ICD-10-CM  1. Primary osteoarthritis of right hip M16.11   Plan:   The patient has end-stage degenerative changes of the right hip. It was explained to the patient that the condition is progressive in nature. Having failed conservative treatment, the patient has elected to proceed with a total joint arthroplasty. The patient will undergo a total  joint arthroplasty with Dr. Ernest Pine. The risks of surgery, including blood clot and infection, were discussed with the patient. Discussed benefits of spinal versus general anesthesia, but advised final decision is up to patient and anesthesia. Measures to reduce these risks, including the use of anticoagulation, perioperative antibiotics, and early ambulation were discussed. The importance of postoperative physical therapy was discussed with the patient. The patient elects to proceed with surgery. The patient is instructed to stop all blood thinners prior to surgery. The patient is instructed to call the hospital the day before surgery to learn of the proper arrival time.   **Communicated Quentin Mulling, FNP, of patient's hypokalemia. He will prescribe K supplement  for patient.**  Contact our office with any questions or concerns. Follow up as indicated, or sooner should any new problems arise, if conditions worsen, or if they are otherwise concerned.   Michelene Gardener, PA-C [facility] [address] [place], [place] [address] Phone: [phone]  This note was generated in part with voice recognition software and I apologize for any typographical errors that were not detected and corrected.  Electronically signed by Michelene Gardener, PA at 09/25/2021 3:30 PM EST

## 2021-10-03 NOTE — Op Note (Signed)
OPERATIVE NOTE  DATE OF SURGERY:  10/03/2021  PATIENT NAME:  Latasha [name]   DOB: 06-Aug-1961  MRN: [id number]  PRE-OPERATIVE DIAGNOSIS: Degenerative arthrosis of the right hip, primary  POST-OPERATIVE DIAGNOSIS:  Same  PROCEDURE:  Right total hip arthroplasty  SURGEON:  Jena Gauss. M.D.  ASSISTANT: Baldwin Jamaica, PA-C (present and scrubbed throughout the case, critical for assistance with exposure, retraction, instrumentation, and closure)  ANESTHESIA: spinal  ESTIMATED BLOOD LOSS: 200 mL  FLUIDS REPLACED: 1500 mL of crystalloid  DRAINS: 2 medium Hemovac drains  IMPLANTS UTILIZED: DePuy 13.5 mm large stature AML femoral stem, 52 mm OD Pinnacle Gription Sector acetabular component, 6.5 mm x 25 mm cancellous bone screw, +4 mm neutral Pinnacle Altrx polyethylene insert, and a 36 mm M-SPEC +1.5 mm hip ball  INDICATIONS FOR SURGERY: Latasha [name] is a 61 y.o. year old female with a long history of progressive hip and groin  pain. X-rays demonstrated severe degenerative changes. The patient had not seen any significant improvement despite conservative nonsurgical intervention. After discussion of the risks and benefits of surgical intervention, the patient expressed understanding of the risks benefits and agree with plans for total hip arthroplasty.   The risks, benefits, and alternatives were discussed at length including but not limited to the risks of infection, bleeding, nerve injury, stiffness, blood clots, the need for revision surgery, limb length inequality, dislocation, cardiopulmonary complications, among others, and they were willing to proceed.  PROCEDURE IN DETAIL: The patient was brought into the operating room and, after adequate spinal anesthesia was achieved, the patient was placed in a left lateral decubitus position. Axillary roll was placed and all bony prominences were well-padded. The patient's right hip was cleaned and prepped with alcohol and DuraPrep and  draped in the usual sterile fashion. A "timeout" was performed as per usual protocol. A lateral curvilinear incision was made gently curving towards the posterior superior iliac spine. The IT band was incised in line with the skin incision and the fibers of the gluteus maximus were split in line. The piriformis tendon was identified, skeletonized, and incised at its insertion to the proximal femur and reflected posteriorly. A T type posterior capsulotomy was performed. Prior to dislocation of the femoral head, a threaded Steinmann pin was inserted through a separate stab incision into the pelvis superior to the acetabulum and bent in the form of a stylus so as to assess limb length and hip offset throughout the procedure. The femoral head was then dislocated posteriorly. Inspection of the femoral head demonstrated severe degenerative changes with full-thickness loss of articular cartilage. The femoral neck cut was performed using an oscillating saw. The anterior capsule was elevated off of the femoral neck using a periosteal elevator. Attention was then directed to the acetabulum. The remnant of the labrum was excised using electrocautery. Inspection of the acetabulum also demonstrated significant degenerative changes. The acetabulum was reamed in sequential fashion up to a 51 mm diameter. Good punctate bleeding bone was encountered. A 52 mm Pinnacle Gription Sector acetabular component was positioned and impacted into place. Good scratch fit was appreciated.  A 6.5 mm x 25 mm cancellous bone screw was used through a dome hole.  A +4 mm neutral polyethylene trial was inserted.  Attention was then directed to the proximal femur. A hole for reaming of the proximal femoral canal was created using a high-speed burr. The femoral canal was reamed in sequential fashion up to a 13 mm diameter. This allowed for approximately 6  cm of scratch fit. Serial broaches were inserted up to a 13.5 mm large stature femoral broach.  Calcar region was planed and a trial reduction was performed using a 36 mm hip ball with a +1.5 mm neck length. Good equalization of limb lengths and hip offset was appreciated and excellent stability was noted both anteriorly and posteriorly. Trial components were removed. The acetabular shell was irrigated with copious amounts of normal saline with antibiotic solution and suctioned dry. A +4 mm neutral Pinnacle Altrx polyethylene insert was positioned and impacted into place. Next, a 13.5 mm large stature AML femoral stem was positioned and impacted into place. Excellent scratch fit was appreciated. A trial reduction was again performed with a 36 mm hip ball with a +1.5 mm neck length. Again, good equalization of limb lengths was appreciated and excellent stability appreciated both anteriorly and posteriorly. The hip was then dislocated and the trial hip ball was removed. The Morse taper was cleaned and dried. A 36 mm M-SPEC hip ball with a +1.5 mm neck length was placed on the trunnion and impacted into place. The hip was then reduced and placed through range of motion. Excellent stability was appreciated both anteriorly and posteriorly.  The wound was irrigated with copious amounts of normal saline followed by 350 ml of Prontosan and suctioned dry. Good hemostasis was appreciated. The posterior capsulotomy was repaired using #5 Ethibond. Piriformis tendon was reapproximated to the undersurface of the gluteus medius tendon using #5 Ethibond. The IT band was reapproximated using interrupted sutures of #1 Vicryl. Subcutaneous tissue was approximated using first #0 Vicryl followed by #2-0 Vicryl. The skin was closed with skin staples.  The patient tolerated the procedure well and was transported to the recovery room in stable condition.   Jena Gauss., M.D.

## 2021-10-03 NOTE — Transfer of Care (Signed)
Immediate Anesthesia Transfer of Care Note  Patient: Latasha [name]  Procedure(s) Performed: TOTAL HIP ARTHROPLASTY (Right: Hip)  Patient Location: PACU  Anesthesia Type:Spinal  Level of Consciousness: awake, alert  and oriented  Airway & Oxygen Therapy: Patient Spontanous Breathing and Patient connected to face mask oxygen  Post-op Assessment: Report given to RN and Post -op Vital signs reviewed and stable  Post vital signs: Reviewed and stable  Last Vitals:  Vitals Value Taken Time  BP 115/92 10/03/21 1731  Temp    Pulse 67 10/03/21 1734  Resp 22 10/03/21 1734  SpO2 100 % 10/03/21 1734  Vitals shown include unvalidated device data.  Last Pain:  Vitals:   10/03/21 1223  TempSrc: Oral  PainSc: 7          Complications: No notable events documented.

## 2021-10-03 NOTE — Plan of Care (Signed)
Reviewed plan of care with patient and daugther

## 2021-10-03 NOTE — Anesthesia Procedure Notes (Signed)
Spinal  Patient location during procedure: OR Start time: 10/03/2021 1:28 PM End time: 10/03/2021 1:31 PM Reason for block: surgical anesthesia Staffing Performed: resident/CRNA  Anesthesiologist: Iran Ouch, MD Resident/CRNA: Aline Brochure, CRNA Preanesthetic Checklist Completed: patient identified, IV checked, site marked, risks and benefits discussed, surgical consent, monitors and equipment checked, pre-op evaluation and timeout performed Spinal Block Patient position: sitting Prep: Betadine Patient monitoring: heart rate, continuous pulse ox, blood pressure and cardiac monitor Approach: midline Location: L4-5 Injection technique: single-shot Needle Needle type: Whitacre and Introducer  Needle gauge: 24 G Needle length: 9 cm Assessment Events: CSF return Additional Notes Negative paresthesia. Negative blood return. Positive free-flowing CSF. Expiration date of kit checked and confirmed. Patient tolerated procedure well, without complications.

## 2021-10-03 NOTE — Progress Notes (Signed)
Patient unable to position herself comfortably in bed, has requested to sit in chair. "Pain is more so from pinched nerve"  RN obtained an order allowing patient to sit in chair from doctor on call.  RN informed nurse supervisor of patient's requests

## 2021-10-03 NOTE — Discharge Summary (Addendum)
Physician Discharge Summary  Patient ID: Latasha [name] MRN: [id number] DOB/AGE: 09/14/1960 61 y.o.  Admit date: 10/03/2021 Discharge date: 10/05/2021  Admission Diagnoses:  Hx of total hip arthroplasty, right [Z96.641]  Surgeries:Procedure(s): Right total hip arthroplasty   SURGEON:  Jena Gauss. M.D.   ASSISTANT: Baldwin Jamaica, PA-C (present and scrubbed throughout the case, critical for assistance with exposure, retraction, instrumentation, and closure)   ANESTHESIA: spinal   ESTIMATED BLOOD LOSS: 200 mL   FLUIDS REPLACED: 1500 mL of crystalloid   DRAINS: 2 medium Hemovac drains   IMPLANTS UTILIZED: DePuy 13.5 mm large stature AML femoral stem, 52 mm OD Pinnacle Gription Sector acetabular component, 6.5 mm x 25 mm cancellous bone screw, +4 mm neutral Pinnacle Altrx polyethylene insert, and a 36 mm M-SPEC +1.5 mm hip ball  Discharge Diagnoses: Patient Active Problem List   Diagnosis Date Noted   Hx of total hip arthroplasty, right 10/03/2021   Right wrist pain 09/06/2020   BP (high blood pressure) 09/05/2015   Osteoporosis, post-menopausal 09/05/2015   Postsurgical menopause 09/05/2015   Class 1 obesity 09/05/2015   GERD (gastroesophageal reflux disease) 09/05/2015   Endometriosis 09/05/2015   Status post total hysterectomy and bilateral salpingo-oophorectomy 09/05/2015   Primary osteoarthritis of both knees 09/28/2014    Past Medical History:  Diagnosis Date   Anxiety    Arthritis    Chronic fatigue    Endometriosis    Family history of osteoporosis    GERD (gastroesophageal reflux disease)    Heart murmur    History of kidney stones    Hypertension    IBS (irritable bowel syndrome)    Obesity    Surgical menopause    Vaginal dryness      Transfusion:    Consultants (if any):   Discharged Condition: Improved  Hospital Course: Latasha [name] is an 61 y.o. female who was admitted 10/03/2021 with a diagnosis of right hip osteoarthritis and went to  the operating room on 10/03/2021 and underwent right total hip arthroplasty through posterior approach. The patient received perioperative antibiotics for prophylaxis (see below). The patient tolerated the procedure well and was transported to PACU in stable condition. After meeting PACU criteria, the patient was subsequently transferred to the Orthopaedics/Rehabilitation unit.   The patient received DVT prophylaxis in the form of early mobilization, Lovenox, TED hose, and SCDs . A sacral pad had been placed and heels were elevated off of the bed with rolled towels in order to protect skin integrity. Foley catheter was discontinued on postoperative day #0. Wound drains were discontinued on postoperative day #1. The surgical incision was healing well without signs of infection.  Physical therapy was initiated postoperatively for transfers, gait training, and strengthening. Occupational therapy was initiated for activities of daily living and evaluation for assisted devices. Rehabilitation goals were reviewed in detail with the patient. The patient made steady progress with physical therapy and physical therapy recommended discharge to Home.   The patient achieved the preliminary goals of this hospitalization and was felt to be medically and orthopaedically appropriate for discharge.  She was given perioperative antibiotics:  Anti-infectives (From admission, onward)    Start     Dose/Rate Route Frequency Ordered Stop   10/04/21 0145  ceFAZolin (ANCEF) 2-4 GM/100ML-% IVPB       Note to Pharmacy: Kerman Passey, Cryst: cabinet override      10/04/21 0145 10/04/21 0158   10/03/21 2002  ceFAZolin (ANCEF) 2-4 GM/100ML-% IVPB  Note to Pharmacy: Kerman Passeyrawford Bass, Cryst: cabinet override      10/03/21 2002 10/03/21 2039   10/03/21 2000  ceFAZolin (ANCEF) IVPB 2g/100 mL premix        2 g 200 mL/hr over 30 Minutes Intravenous Every 6 hours 10/03/21 1833 10/04/21 0228   10/03/21 1210  ceFAZolin (ANCEF) 2-4  GM/100ML-% IVPB       Note to Pharmacy: Telford NabSharpe, Eve C: cabinet override      10/03/21 1210 10/03/21 1404   10/03/21 0600  ceFAZolin (ANCEF) IVPB 2g/100 mL premix        2 g 200 mL/hr over 30 Minutes Intravenous On call to O.R. 10/03/21 0129 10/03/21 1358     .  Recent vital signs:  Vitals:   10/04/21 2009 10/05/21 0350  BP: 125/79 115/64  Pulse: 78 80  Resp: 16 16  Temp: 98.4 F (36.9 C) 98.2 F (36.8 C)  SpO2: 99% 97%    Recent laboratory studies:  Recent Labs    10/03/21 1232  HGB 12.2  HCT 36.0  K 3.6  CL 105  BUN 19  CREATININE 0.70  GLUCOSE 84    Diagnostic Studies: DG Hip Port Unilat With Pelvis 1V Right  Result Date: 10/03/2021 CLINICAL DATA:  Status post total hip arthroplasty. EXAM: DG HIP (WITH OR WITHOUT PELVIS) 1V PORT RIGHT COMPARISON:  None. FINDINGS: Interval postsurgical changes from right total hip arthroplasty. Arthroplasty components appear in their expected alignment. No periprosthetic fracture is identified. Expected postoperative changes within the overlying soft tissues. Expected postsurgical IMPRESSION: Changes of right total hip arthroplasty. Electronically Signed   By: Allegra LaiLeah  Strickland M.D.   On: 10/03/2021 18:09    Discharge Medications:   Allergies as of 10/05/2021       Reactions   Moxifloxacin Hcl In Nacl Rash   Does not remember - onset 01/15/2010        Medication List     STOP taking these medications    acetaminophen-codeine 300-30 MG tablet Commonly known as: TYLENOL #3   Advil Dual Action 125-250 MG Tabs Generic drug: Ibuprofen-Acetaminophen   HYDROcodone-acetaminophen 5-325 MG tablet Commonly known as: NORCO/VICODIN   ibuprofen 800 MG tablet Commonly known as: ADVIL       TAKE these medications    acetaminophen 650 MG CR tablet Commonly known as: TYLENOL Take 1,300 mg by mouth every 8 (eight) hours as needed for pain.   alum & mag hydroxide-simeth 200-200-20 MG/5ML suspension Commonly known as:  MAALOX/MYLANTA Take 30 mLs by mouth every 6 (six) hours as needed for indigestion or heartburn.   bisoprolol-hydrochlorothiazide 10-6.25 MG tablet Commonly known as: ZIAC Take 1 tablet by mouth daily.   celecoxib 200 MG capsule Commonly known as: CELEBREX Take 1 capsule (200 mg total) by mouth 2 (two) times daily.   cetirizine 10 MG tablet Commonly known as: ZYRTEC Take 10 mg by mouth daily as needed.   diclofenac 75 MG EC tablet Commonly known as: VOLTAREN Take 75 mg by mouth daily as needed for moderate pain.   diclofenac Sodium 1 % Gel Commonly known as: VOLTAREN Apply 2 g topically 4 (four) times daily as needed (pain).   enoxaparin 40 MG/0.4ML injection Commonly known as: LOVENOX Inject 0.4 mLs (40 mg total) into the skin daily for 14 days.   furosemide 20 MG tablet Commonly known as: LASIX Take 20 mg by mouth daily as needed for fluid.   hydrOXYzine 25 MG tablet Commonly known as: ATARAX Take 25 mg by mouth  daily as needed for anxiety or itching.   Lidocaine 4 % Ptch Place 3-4 patches onto the skin daily as needed (pain).   MUCINEX D PO Take by mouth.   Multi-Vitamins Tabs Take 1 tablet by mouth daily.   oxyCODONE 5 MG immediate release tablet Commonly known as: Oxy IR/ROXICODONE Take 1 tablet (5 mg total) by mouth every 4 (four) hours as needed for moderate pain (pain score 4-6).   pantoprazole 40 MG tablet Commonly known as: PROTONIX Take 40 mg by mouth daily as needed (heartburn).   potassium chloride SA 20 MEQ tablet Commonly known as: KLOR-CON M Take 20 mEq by mouth daily.   potassium chloride SA 20 MEQ tablet Commonly known as: KLOR-CON M Take 1 tablet (20 mEq total) by mouth 2 (two) times daily for 7 days. NOTE SHORT TERM DOSE INCREASE. THIS IS THE SAME MEDICATION AS YOU ALREADY HAVE, ONLY IT COVERS THE DOSE INCREASE. COMPLETE AS PRESCRIBED, THEN RETURN TO YOUR REGULAR DOSE.   predniSONE 50 MG tablet Commonly known as: DELTASONE One tab PO  daily for 5 days.   tiZANidine 2 MG tablet Commonly known as: ZANAFLEX Take 2 mg by mouth every 6 (six) hours as needed for muscle spasms.   traMADol 50 MG tablet Commonly known as: ULTRAM Take 1 tablet (50 mg total) by mouth every 4 (four) hours as needed for moderate pain.   triamcinolone cream 0.1 % Commonly known as: KENALOG Apply 1 application topically 2 (two) times daily. As needed to hands. Avoid face, groin, underarms.   vitamin C 1000 MG tablet Take 1,000 mg by mouth daily.               Durable Medical Equipment  (From admission, onward)           Start     Ordered   10/04/21 1349  DME Bedside commode  Once       Comments: Due to body [employer] needs Bariatric 3 in 1  Question:  Patient needs a bedside commode to treat with the following condition  Answer:  S/P total hip arthroplasty   10/04/21 1348   10/03/21 1834  DME Walker rolling  Once       Question:  Patient needs a walker to treat with the following condition  Answer:  S/P total hip arthroplasty   10/03/21 1833            Disposition: Home with home health PT     Follow-up Information     Hooten, Illene Labrador, MD Follow up on 11/13/2021.   Specialty: Orthopedic Surgery Why: at 3:00pm Contact information: [address] [facility] [place] [address] [phone]                  Lasandra Beech, PA-C 10/05/2021, 8:30 AM

## 2021-10-03 NOTE — H&P (Signed)
The patient has been re-examined, and the chart reviewed, and there have been no interval changes to the documented history and physical.    The risks, benefits, and alternatives have been discussed at length. The patient expressed understanding of the risks benefits and agreed with plans for surgical intervention.  Krisann Mckenna P. Carthel Castille, Jr. M.D.    

## 2021-10-03 NOTE — Anesthesia Preprocedure Evaluation (Addendum)
Anesthesia Evaluation  Patient identified by MRN, date of birth, ID band Patient awake    Reviewed: Allergy & Precautions, NPO status , Patient's Chart, lab work & pertinent test results  Airway Mallampati: I  TM Distance: >3 FB Neck ROM: full    Dental no notable dental hx.    Pulmonary Current Smoker and Patient abstained from smoking.,  High risk for sleep apnea   Pulmonary exam normal        Cardiovascular Exercise Tolerance: Poor hypertension, Pt. on medications + DOE  (-) Orthopnea + Valvular Problems/Murmurs  Rhythm:Regular Rate:Normal + Peripheral Edema Chest pain while in bed around 2 weeks   Neuro/Psych PSYCHIATRIC DISORDERS Anxiety Bilateral Sciatica negative neurological ROS     GI/Hepatic Neg liver ROS, GERD  Controlled,Intermittent rectal bleeding   Endo/Other  negative endocrine ROS  Renal/GU      Musculoskeletal  (+) Arthritis ,   Abdominal (+) + obese,   Peds  Hematology negative hematology ROS (+)   Anesthesia Other Findings Past Medical History: No date: Anxiety No date: Arthritis No date: Chronic fatigue No date: Diabetes mellitus without complication (HCC) No date: Endometriosis No date: Family history of osteoporosis No date: GERD (gastroesophageal reflux disease) No date: Heart murmur No date: History of kidney stones No date: Hypertension No date: IBS (irritable bowel syndrome) No date: Obesity No date: Surgical menopause No date: Vaginal dryness  Past Surgical History: No date: ABDOMINAL HYSTERECTOMY     Comment:  [place] 1976: TONSILLECTOMY     Reproductive/Obstetrics negative OB ROS                            Anesthesia Physical Anesthesia Plan  ASA: 2  Anesthesia Plan: Spinal   Post-op Pain Management: Tylenol PO (pre-op), Celebrex PO (pre-op) and Toradol IV (intra-op)   Induction:   PONV Risk Score and Plan: TIVA  Airway Management  Planned: Natural Airway and Nasal Cannula  Additional Equipment:   Intra-op Plan:   Post-operative Plan: Extubation in OR  Informed Consent: I have reviewed the patients History and Physical, chart, labs and discussed the procedure including the risks, benefits and alternatives for the proposed anesthesia with the patient or authorized representative who has indicated his/her understanding and acceptance.     Dental advisory given  Plan Discussed with: Anesthesiologist, CRNA and Surgeon  Anesthesia Plan Comments:        Anesthesia Quick Evaluation

## 2021-10-04 ENCOUNTER — Encounter: Payer: Self-pay | Admitting: Orthopedic Surgery

## 2021-10-04 DIAGNOSIS — M1611 Unilateral primary osteoarthritis, right hip: Secondary | ICD-10-CM | POA: Diagnosis not present

## 2021-10-04 MED ORDER — OXYCODONE HCL 5 MG PO TABS
ORAL_TABLET | ORAL | Status: AC
  Filled 2021-10-04: qty 2

## 2021-10-04 MED ORDER — ACETAMINOPHEN 10 MG/ML IV SOLN
INTRAVENOUS | Status: AC
  Administered 2021-10-04: 1000 mg via INTRAVENOUS
  Filled 2021-10-04: qty 100

## 2021-10-04 MED ORDER — OXYCODONE HCL 5 MG PO TABS
ORAL_TABLET | ORAL | Status: AC
  Administered 2021-10-04: 5 mg via ORAL
  Filled 2021-10-04: qty 1

## 2021-10-04 MED ORDER — OXYCODONE HCL 5 MG PO TABS
ORAL_TABLET | ORAL | Status: AC
  Filled 2021-10-04: qty 1

## 2021-10-04 MED ORDER — POTASSIUM CHLORIDE CRYS ER 20 MEQ PO TBCR
EXTENDED_RELEASE_TABLET | ORAL | Status: AC
  Administered 2021-10-04: 20 meq via ORAL
  Filled 2021-10-04: qty 1

## 2021-10-04 MED ORDER — METOCLOPRAMIDE HCL 10 MG PO TABS
ORAL_TABLET | ORAL | Status: AC
  Administered 2021-10-04: 10 mg via ORAL
  Filled 2021-10-04: qty 1

## 2021-10-04 MED ORDER — MAGNESIUM HYDROXIDE 400 MG/5ML PO SUSP
ORAL | Status: AC
  Administered 2021-10-04: 30 mL via ORAL
  Filled 2021-10-04: qty 30

## 2021-10-04 MED ORDER — HYDROCODONE-ACETAMINOPHEN 5-325 MG PO TABS
1.0000 | ORAL_TABLET | ORAL | Status: DC | PRN
  Administered 2021-10-05: 1 via ORAL
  Filled 2021-10-04: qty 1

## 2021-10-04 MED ORDER — CELECOXIB 200 MG PO CAPS
ORAL_CAPSULE | ORAL | Status: AC
  Administered 2021-10-04: 200 mg via ORAL
  Filled 2021-10-04: qty 1

## 2021-10-04 MED ORDER — CEFAZOLIN SODIUM-DEXTROSE 2-4 GM/100ML-% IV SOLN
INTRAVENOUS | Status: AC
  Administered 2021-10-04: 2000 mg
  Filled 2021-10-04: qty 100

## 2021-10-04 NOTE — Plan of Care (Signed)
  Problem: Education: Goal: Knowledge of General Education information will improve Description Including pain rating scale, medication(s)/side effects and non-pharmacologic comfort measures Outcome: Progressing   

## 2021-10-04 NOTE — Progress Notes (Signed)
Met with the patient at the bedsdie, she will get 3 in 1 and RW delivered to the room prior to DC, she is set up with [facility]

## 2021-10-04 NOTE — Progress Notes (Signed)
Patient continues to verbalize dissatisfaction with "being forced to stay in holding area", inability to control tempeture, and not having a private bath.  This RN explained level of care is equivalent to the floor, inquired if there were any needs that could be addressed to provide comfort.  Patient continued to be verbally aggressive with Nurse tech and RN.  Nurse Supervisor, Colletta Maryland addressed issues along with RN, offered to see if there were any alternatives for hospital bedding, but explained "egg crates" are not acceptable due to best practice standards.  Both Supervisor and RN attempted to educate patient on importance of pain control and use of medications prescribed.  Patient reiterated her refusal of any narcotics, only wanted Tylenol due to family history and fear of side effects.  Patient verbalized intentions to convey dissatisfaction of circumstances.  Patient apologized for verbal aggressiveness and if she was perceived as rude.  This RN encouraged patient to know and feel assured her level of care would not change and her choice of not receiving narcotics would be honored.  Patient placed in reclining chair with a bear hugger to assist in keeping patient as warm as she desired.  "I know I am not being nice, I am sorry".  Patient verbalized appreciation of this RN's "patience and kindness";

## 2021-10-04 NOTE — Progress Notes (Signed)
°  Subjective: 1 Day Post-Op Procedure(s) (LRB): TOTAL HIP ARTHROPLASTY (Right) Chart review shows patient had significant pain overnight but refused pain medicine until early AM. Daughter in room with patient. Patient reports pain as moderate.   Patient is well, but has had some minor complaints of grogginess. Plan is to go Home after hospital stay. Negative for chest pain and shortness of breath Fever: no Gastrointestinal: negative for nausea and vomiting.  Patient has not had a bowel movement.  Objective: Vital signs in last 24 hours: Temp:  [97 F (36.1 C)-98.3 F (36.8 C)] 98.3 F (36.8 C) (02/02 0400) Pulse Rate:  [57-82] 78 (02/02 0400) Resp:  [10-20] 16 (02/02 0400) BP: (111-162)/(72-97) 111/72 (02/02 0400) SpO2:  [97 %-100 %] 98 % (02/02 0400) Weight:  [91.2 kg] 91.2 kg (02/01 1223)  Intake/Output from previous day:  Intake/Output Summary (Last 24 hours) at 10/04/2021 0842 Last data filed at 10/04/2021 0500 Gross per 24 hour  Intake 2056.01 ml  Output 1340 ml  Net 716.01 ml    Intake/Output this shift: No intake/output data recorded.  Labs: Recent Labs    10/03/21 1232  HGB 12.2   Recent Labs    10/03/21 1232  HCT 36.0   Recent Labs    10/03/21 1232  NA 140  K 3.6  CL 105  BUN 19  CREATININE 0.70  GLUCOSE 84   No results for input(s): LABPT, INR in the last 72 hours.   EXAM General - Patient is Alert, Appropriate, and Oriented; patient sitting in chair with bear hugger on, no bed in room  Extremity - Neurovascular intact Dorsiflexion/Plantar flexion intact Compartment soft Dressing/Incision -clean, dry, no drainage, Hemovac in place.  Motor Function - intact, moving foot and toes well on exam.  Cardiovascular- Regular rate and rhythm, no murmurs/rubs/gallops Respiratory- Lungs clear to auscultation bilaterally Gastrointestinal- soft, nontender, and active bowel sounds   Assessment/Plan: 1 Day Post-Op Procedure(s) (LRB): TOTAL HIP  ARTHROPLASTY (Right) Principal Problem:   Hx of total hip arthroplasty, right  Estimated body mass index is 35.61 kg/m as calculated from the following:   Height as of this encounter: 5\' 3"  (1.6 m).   Weight as of this encounter: 91.2 kg. Advance diet Up with therapy  Possible d/c home this PM pending therapy progress.      DVT Prophylaxis - Lovenox, Ted hose, and SCDs Weight-Bearing as tolerated to right leg  , PA-C [facility] 10/04/2021, 8:42 AM

## 2021-10-04 NOTE — Progress Notes (Addendum)
Physical Therapy Treatment Patient Details Name: Latasha [name] MRN: [id number] DOB: [birth year] Today's Date: 10/04/2021   History of Present Illness 61 yo female s/p R THA on 10/03/21. PmHx: Arthritis, Endometriosis, HTN, Osteoprosis, Anxiety, and GERD.    PT Comments    Pt was awake and alert resting in bed upon PT entrance into room. She states her pain is currently a 5/10 at rest, and that she still is feeling very groggy. She was able to perform sit to stand w/ CGA using RW and then was able to ambulate ~68ft before increase in fatigue and needing to sit in recliner. Pt was able to perform stairs using right-sided railing like she has at home with CGA and constant verbal cueing. However needed increased time to perform them and was only able to complete one attempt before increase in fatigue. She will benefit from performing stairs again to make sure she maintains her posterior hip precautions and to ensure safety w/ stairs upon discharge. Pt will benefit from continued skilled PT in order to improve LE strength, mobility, gait, decrease c/o pain, and restore PLOF. Current discharge recommendation remains appropriate due to the level of assistance required by the patient to ensure safety and improve overall function.   Recommendations for follow up therapy are one component of a multi-disciplinary discharge planning process, led by the attending physician.  Recommendations may be updated based on patient status, additional functional criteria and insurance authorization.  Follow Up Recommendations  Home health PT     Assistance Recommended at Discharge Intermittent Supervision/Assistance  Patient can return home with the following A little help with walking and/or transfers;A little help with bathing/dressing/bathroom;Assistance with cooking/housework;Assist for transportation;Help with stairs or ramp for entrance   Equipment Recommendations  Rolling walker (2 wheels)    Recommendations  for Other Services       Precautions / Restrictions Precautions Precautions: Posterior Hip;Fall Precaution Booklet Issued: Yes (comment) Restrictions Weight Bearing Restrictions: Yes RLE Weight Bearing: Weight bearing as tolerated     Mobility  Bed Mobility                    Transfers                        Ambulation/Gait Ambulation/Gait assistance: Min guard Gait Distance (Feet): 60 Feet Assistive device: Rolling walker (2 wheels) Gait Pattern/deviations: Step-through pattern, Decreased step length - right, Decreased step length - left, Decreased stride length Gait velocity: decreased         Stairs Stairs: Yes Stairs assistance: Min guard Stair Management: Step to pattern, One rail Right Number of Stairs: 4 General stair comments: cues for technique, pt fatigued after performing once. would benefit from reinforcement   Wheelchair Mobility    Modified Rankin (Stroke Patients Only)       Balance Overall balance assessment: Needs assistance Sitting-balance support: Bilateral upper extremity supported, Feet supported Sitting balance-Leahy Scale: Fair     Standing balance support: Bilateral upper extremity supported, During functional activity Standing balance-Leahy Scale: Fair                              Cognition Arousal/Alertness: Awake/alert Behavior During Therapy: Anxious (lethargic) Overall Cognitive Status: Within Functional Limits for tasks assessed  General Comments: Pt reports still feeling groggy from pain meds; very particular about how things should be done currently and upon discharge.        Exercises Total Joint Exercises Quad Sets: AROM, Both, 10 reps Gluteal Sets: AROM, Both, 10 reps    General Comments        Pertinent Vitals/Pain Pain Assessment Pain Assessment: 0-10 Pain Score: 5  Pain Location: r hip Pain Descriptors / Indicators: Aching,  Discomfort, Grimacing, Sore Pain Intervention(s): Limited activity within patient's tolerance, Monitored during session, Premedicated before session, Repositioned, Ice applied    Home Living Family/patient expects to be discharged to:: Private residence Living Arrangements: Spouse/significant other Available Help at Discharge: Family Type of Home: House Home Access: Stairs to enter Entrance Stairs-Rails: Chemical engineer of Steps: 5   Home Layout: One level;Two level Home Equipment: None Additional Comments: Pt reports she can/will be bouncing between two homes following discharge.    Prior Function            PT Goals (current goals can now be found in the care plan section) Acute Rehab PT Goals Patient Stated Goal: to go home PT Goal Formulation: With patient Time For Goal Achievement: 10/18/21 Potential to Achieve Goals: Good Progress towards PT goals: Progressing toward goals    Frequency    BID      PT Plan      Co-evaluation              AM-PAC PT "6 Clicks" Mobility   Outcome Measure  Help needed turning from your back to your side while in a flat bed without using bedrails?: A Little Help needed moving from lying on your back to sitting on the side of a flat bed without using bedrails?: A Little Help needed moving to and from a bed to a chair (including a wheelchair)?: A Little Help needed standing up from a chair using your arms (e.g., wheelchair or bedside chair)?: A Little Help needed to walk in hospital room?: A Little Help needed climbing 3-5 steps with a railing? : A Lot (increased time due to thinking) 6 Click Score: 17    End of Session Equipment Utilized During Treatment: Gait belt Activity Tolerance: Patient tolerated treatment well;Patient limited by fatigue;No increased pain Patient left: in chair;with call bell/phone within reach Nurse Communication: Mobility status PT Visit Diagnosis: Unsteadiness on feet  (R26.81);Pain;Muscle weakness (generalized) (M62.81);Other abnormalities of gait and mobility (R26.89) Pain - Right/Left: Right Pain - part of body: Hip     Time: 1411-1441 PT Time Calculation (min) (ACUTE ONLY): 30 min  Charges:  $Therapeutic Exercise: 8-22 mins                      Jonnie Kind, SPT 10/04/2021, 2:58 PM

## 2021-10-04 NOTE — Evaluation (Signed)
Physical Therapy Evaluation Patient Details Name: Latasha [name] MRN: [id number] DOB: June 28, 1961 Today's Date: 10/04/2021  History of Present Illness  61 yo female s/p R THA on 10/03/21. PmHx: Arthritis, Endometriosis, HTN, Osteoprosis, Anxiety, and GERD.  Clinical Impression  Pt resting in recliner upon PT entrance into room for POD#1 evaluation. She is A&Ox4 and reports 6/10 pain currently at rest, as well as reports "she is feeling very groggy from pain meds and wants to stay another night until the grogginess goes away and she feels more herself". She reports that she was independent w/ all ADLs prior to surgery, and that she will be going between 2 locations (her recently separated husband's house and mother's house) upon discharge. She reports she does have a daughter, son, and step-son who can be available at discharge for help.  Pt was able to complete sit to stand from recliner w/ CGA and utilization of RW. She required verbal cueing about proper UE placement and LE placement in order to maintain posterior hip precautions. Once standing, pt was able to ambulate ~34ft w/ CGA and RW. She did not report any increase in pain with transfers or ambulation. Pt will benefit from continued skilled PT in order to improve LE strength, mobility, gait, decrease c/o pain, and restore PLOF. Current discharge recommendation to HHPT is appropriate due to the level of assistance required by the patient to ensure safety and improve overall function.      Recommendations for follow up therapy are one component of a multi-disciplinary discharge planning process, led by the attending physician.  Recommendations may be updated based on patient status, additional functional criteria and insurance authorization.  Follow Up Recommendations Home health PT    Assistance Recommended at Discharge Intermittent Supervision/Assistance  Patient can return home with the following  A little help with walking and/or  transfers;A little help with bathing/dressing/bathroom;Assistance with cooking/housework;Assist for transportation;Help with stairs or ramp for entrance    Equipment Recommendations Rolling walker (2 wheels)  Recommendations for Other Services       Functional Status Assessment Patient has had a recent decline in their functional status and demonstrates the ability to make significant improvements in function in a reasonable and predictable amount of time.     Precautions / Restrictions Precautions Precautions: Posterior Hip;Fall Precaution Booklet Issued: Yes (comment) Restrictions Weight Bearing Restrictions: Yes RLE Weight Bearing: Weight bearing as tolerated      Mobility  Bed Mobility                    Transfers                        Ambulation/Gait Ambulation/Gait assistance: Min guard Gait Distance (Feet): 30 Feet Assistive device: Rolling walker (2 wheels) Gait Pattern/deviations: Step-through pattern, Decreased step length - right, Decreased step length - left, Decreased stride length Gait velocity: decreased        Stairs            Wheelchair Mobility    Modified Rankin (Stroke Patients Only)       Balance Overall balance assessment: Needs assistance Sitting-balance support: Bilateral upper extremity supported, Feet supported Sitting balance-Leahy Scale: Fair     Standing balance support: Bilateral upper extremity supported, During functional activity Standing balance-Leahy Scale: Fair                               Pertinent  Vitals/Pain Pain Assessment Pain Assessment: 0-10 Pain Score: 6  Pain Location: r hip Pain Descriptors / Indicators: Aching, Discomfort, Grimacing, Sore Pain Intervention(s): Limited activity within patient's tolerance, Monitored during session, Premedicated before session, Repositioned, Ice applied    Home Living Family/patient expects to be discharged to:: Private residence Living  Arrangements: Spouse/significant other Available Help at Discharge: Family Type of Home: House Home Access: Stairs to enter Entrance Stairs-Rails: Chemical engineer of Steps: 5   Home Layout: One level;Two level Home Equipment: None Additional Comments: Pt reports she can/will be bouncing between two homes following discharge.    Prior Function Prior Level of Function : Independent/Modified Independent                     Hand Dominance        Extremity/Trunk Assessment   Upper Extremity Assessment Upper Extremity Assessment: Overall WFL for tasks assessed    Lower Extremity Assessment Lower Extremity Assessment: RLE deficits/detail;Overall WFL for tasks assessed RLE Deficits / Details: decreased strength due to THA       Communication      Cognition Arousal/Alertness: Awake/alert Behavior During Therapy: Anxious Overall Cognitive Status: Within Functional Limits for tasks assessed                                 General Comments: Pt reports feeling groggy from pain meds; very particular about how things should be done currently and upon discharge.        General Comments      Exercises Total Joint Exercises Quad Sets: AROM, Both, 10 reps Gluteal Sets: AROM, Both, 10 reps   Assessment/Plan    PT Assessment Patient needs continued PT services  PT Problem List Decreased strength;Decreased range of motion;Decreased mobility;Decreased coordination;Decreased activity tolerance;Decreased balance;Pain       PT Treatment Interventions DME instruction;Gait training;Stair training;Functional mobility training;Therapeutic activities;Patient/family education;Neuromuscular re-education;Balance training;Therapeutic exercise    PT Goals (Current goals can be found in the Care Plan section)  Acute Rehab PT Goals Patient Stated Goal: to go home PT Goal Formulation: With patient Time For Goal Achievement: 10/18/21 Potential to Achieve  Goals: Good    Frequency BID     Co-evaluation               AM-PAC PT "6 Clicks" Mobility  Outcome Measure Help needed turning from your back to your side while in a flat bed without using bedrails?: A Little Help needed moving from lying on your back to sitting on the side of a flat bed without using bedrails?: A Little Help needed moving to and from a bed to a chair (including a wheelchair)?: A Little Help needed standing up from a chair using your arms (e.g., wheelchair or bedside chair)?: A Little Help needed to walk in hospital room?: A Little Help needed climbing 3-5 steps with a railing? : A Lot 6 Click Score: 17    End of Session Equipment Utilized During Treatment: Gait belt Activity Tolerance: Patient tolerated treatment well;Patient limited by fatigue;No increased pain Patient left: in chair;with call bell/phone within reach Nurse Communication: Mobility status PT Visit Diagnosis: Unsteadiness on feet (R26.81);Pain;Muscle weakness (generalized) (M62.81) Pain - Right/Left: Right Pain - part of body: Hip    Time: [id number] PT Time Calculation (min) (ACUTE ONLY): 23 min   Charges:             Jonnie Kind, SPT 10/04/2021, 1:01 PM

## 2021-10-04 NOTE — Progress Notes (Signed)
Patient ambulating to bathroom with physical therapy

## 2021-10-04 NOTE — Anesthesia Postprocedure Evaluation (Signed)
Anesthesia Post Note  Patient: Latasha [name]  Procedure(s) Performed: TOTAL HIP ARTHROPLASTY (Right: Hip)  Patient location during evaluation: Nursing Unit Anesthesia Type: Spinal Level of consciousness: oriented and awake and alert Pain management: pain level controlled Vital Signs Assessment: post-procedure vital signs reviewed and stable Respiratory status: spontaneous breathing and respiratory function stable Cardiovascular status: blood pressure returned to baseline and stable Postop Assessment: no headache, no backache, no apparent nausea or vomiting and patient able to bend at knees Anesthetic complications: no   No notable events documented.   Last Vitals:  Vitals:   10/04/21 0000 10/04/21 0400  BP: 118/75 111/72  Pulse: 77 78  Resp: 16 16  Temp: (!) 36.3 C 36.8 C  SpO2: 98% 98%    Last Pain:  Vitals:   10/04/21 0400  TempSrc: Temporal  PainSc: Asleep                 [address]

## 2021-10-04 NOTE — Evaluation (Signed)
Occupational Therapy Evaluation Patient Details Name: Latasha [name] MRN: [id number] DOB: [birth year] Today's Date: 10/04/2021   History of Present Illness 61 yo female s/p R THA on 10/03/21. PmHx: Arthritis, Endometriosis, HTN, Osteoprosis, Anxiety, and GERD.   Clinical Impression   Pt seen for OT evaluation this date in setting of acute hospitalization s/p R THA. She reports being INDEP at baseline, but limited by pain. She presents this date with decreased fxl activity tolerance, post-op pain and limitations r/t R hip precautions. She requires CGA for ADL transfers and fxl mobility with RW as well as MOD A for LB ADLs. OT ed re: LB ADL modification, compression stocking mgt, fall prevention. Pt engaged in standing LB bathing sink-side with MOD A for thorough completion mostly d/t standing balance. She is left in chair with all needs met and in reach end of session. Her daughter was present for all education. Recommending HHOT f/u.      Recommendations for follow up therapy are one component of a multi-disciplinary discharge planning process, led by the attending physician.  Recommendations may be updated based on patient status, additional functional criteria and insurance authorization.   Follow Up Recommendations  Home health OT    Assistance Recommended at Discharge Frequent or constant Supervision/Assistance  Patient can return home with the following A little help with bathing/dressing/bathroom;Assistance with cooking/housework;Assist for transportation;Help with stairs or ramp for entrance    Functional Status Assessment  Patient has had a recent decline in their functional status and demonstrates the ability to make significant improvements in function in a reasonable and predictable amount of time.  Equipment Recommendations  BSC/3in1;Tub/shower seat    Recommendations for Other Services       Precautions / Restrictions Precautions Precautions: Posterior Hip;Fall Precaution  Booklet Issued: Yes (comment) Restrictions Weight Bearing Restrictions: Yes RLE Weight Bearing: Weight bearing as tolerated      Mobility Bed Mobility               General bed mobility comments: up to chair pre/post    Transfers Overall transfer level: Needs assistance Equipment used: Rolling walker (2 wheels) Transfers: Sit to/from Stand Sit to Stand: Min guard                  Balance Overall balance assessment: Needs assistance Sitting-balance support: Bilateral upper extremity supported, Feet supported Sitting balance-Leahy Scale: Fair     Standing balance support: Bilateral upper extremity supported, During functional activity Standing balance-Leahy Scale: Fair                             ADL either performed or assessed with clinical judgement   ADL                                         General ADL Comments: SETUP for seated UB ADLs, MOD A for seated LB ADLs, ed re: modification, CGA for ADL transfers with RW.     Vision Patient Visual Report: No change from baseline       Perception     Praxis      Pertinent Vitals/Pain Pain Assessment Pain Assessment: 0-10 Pain Score: 7  Pain Location: r hip Pain Descriptors / Indicators: Aching, Discomfort, Grimacing, Sore Pain Intervention(s): Limited activity within patient's tolerance, Monitored during session, Patient requesting pain meds-RN notified, RN gave pain meds during  session  ° ° ° °Hand Dominance   °  °Extremity/Trunk Assessment Upper Extremity Assessment °Upper Extremity Assessment: Overall WFL for tasks assessed °  °Lower Extremity Assessment °Lower Extremity Assessment: Defer to PT evaluation;RLE deficits/detail °RLE Deficits / Details: decreased strength due to THA °  °  °  °Communication Communication °Communication: No difficulties °  °Cognition Arousal/Alertness: Awake/alert °Behavior During Therapy: Anxious °Overall Cognitive Status: Within Functional Limits  for tasks assessed °  °  °  °  °  °  °  °  °  °  °  °  °  °  °  °  °General Comments: Pt reports still feeling groggy from pain meds; very particular about how things should be done currently and upon discharge. °  °  °General Comments    ° °  °Exercises Other Exercises °Other Exercises: OT ed re: safety with ADLs, fall prevention, use of AE for LB ADLs, compression stocking mgt. °  °Shoulder Instructions    ° ° °Home Living Family/patient expects to be discharged to:: Private residence °Living Arrangements: Spouse/significant other °Available Help at Discharge: Family °Type of Home: House °Home Access: Stairs to enter °Entrance Stairs-Number of Steps: 5 °Entrance Stairs-Rails: Left;Right °Home Layout: One level;Two level °  °  °Bathroom Shower/Tub: Tub/shower unit °  °Bathroom Toilet: Standard °Bathroom Accessibility: Yes °  °Home Equipment: None °  °Additional Comments: Pt reports she can/will be bouncing between two homes following discharge. °  ° °  °Prior Functioning/Environment Prior Level of Function : Independent/Modified Independent °  °  °  °  °  °  °  °  °  ° °  °  °OT Problem List: Decreased strength;Decreased activity tolerance;Decreased knowledge of use of DME or AE °  °   °OT Treatment/Interventions: Self-care/ADL training;Therapeutic exercise;Therapeutic activities;Patient/family education;Balance training  °  °OT Goals(Current goals can be found in the care plan section) Acute Rehab OT Goals °Patient Stated Goal: to get better and go home °OT Goal Formulation: With patient/family °Time For Goal Achievement: 10/18/21 °Potential to Achieve Goals: Good °ADL Goals °Pt Will Perform Lower Body Dressing: with supervision;with adaptive equipment °Pt Will Transfer to Toilet: with supervision °Pt Will Perform Toileting - Clothing Manipulation and hygiene: with supervision;with adaptive equipment  °OT Frequency: Min 2X/week °  ° °Co-evaluation   °  °  °  °  ° °  °AM-PAC OT "6 Clicks" Daily Activity      °Outcome Measure Help from another person eating meals?: None °Help from another person taking care of personal grooming?: A Little °Help from another person toileting, which includes using toliet, bedpan, or urinal?: A Lot °Help from another person bathing (including washing, rinsing, drying)?: A Lot °Help from another person to put on and taking off regular upper body clothing?: A Little °Help from another person to put on and taking off regular lower body clothing?: A Lot °6 Click Score: 16 °  °End of Session Equipment Utilized During Treatment: Gait belt;Rolling walker (2 wheels) °Nurse Communication: Mobility status ° °Activity Tolerance: Patient tolerated treatment well °Patient left: in chair;with call bell/phone within reach;with family/visitor present ° °OT Visit Diagnosis: Other abnormalities of gait and mobility (R26.89);Muscle weakness (generalized) (M62.81)  °              °Time: 0849-0955 °OT Time Calculation (min): 66 min °Charges:  OT General Charges °$OT Visit: 1 Visit °OT Evaluation °$OT Eval Moderate Complexity: 1 Mod °OT Treatments °$Self Care/Home Management : 23-37 mins °$Therapeutic   Activity: 23-37 mins  Gerrianne Scale, [place], OTR/L ascom [phone] 10/04/21, 6:00 PM

## 2021-10-04 NOTE — Progress Notes (Signed)
Patient and daughter discussed information RN had previously educated patient regarding pain control.  Patient requested RN to discuss pain medications again.  After explanation, patient requested oxycodone.

## 2021-10-04 NOTE — TOC Progression Note (Signed)
Transition of Care [facility]) - Progression Note    Patient Details  Name: Latasha [name] MRN: [id number] Date of Birth: 12/13/1960  Transition of Care [facility]) CM/SW Contact  Marlowe Sax, RN Phone Number: 10/04/2021, 11:14 AM  Clinical Narrative:    [facility] is set up for [facility]        Expected Discharge Plan and Services                                                 Social Determinants of Health (SDOH) Interventions    Readmission Risk Interventions No flowsheet data found.

## 2021-10-05 DIAGNOSIS — M1611 Unilateral primary osteoarthritis, right hip: Secondary | ICD-10-CM | POA: Diagnosis not present

## 2021-10-05 LAB — SURGICAL PATHOLOGY

## 2021-10-05 MED ORDER — OXYCODONE HCL 5 MG PO TABS: 5.0000 mg | ORAL_TABLET | ORAL | 0 refills | Status: DC | PRN

## 2021-10-05 MED ORDER — TRAMADOL HCL 50 MG PO TABS: 50.0000 mg | ORAL_TABLET | ORAL | 0 refills | Status: DC | PRN

## 2021-10-05 MED ORDER — CELECOXIB 200 MG PO CAPS: 200.0000 mg | ORAL_CAPSULE | Freq: Two times a day (BID) | ORAL | 0 refills | Status: AC

## 2021-10-05 MED ORDER — ENOXAPARIN SODIUM 40 MG/0.4ML IJ SOSY
40.0000 mg | PREFILLED_SYRINGE | INTRAMUSCULAR | 0 refills | Status: DC
Start: 2021-10-05 — End: 2023-12-31

## 2021-10-05 NOTE — Progress Notes (Signed)
Blood pressure 108/68, pulse 75, temperature 98.2 F (36.8 C), resp. rate 16, height 5\' 3"  (1.6 m), weight 91.2 kg, SpO2 100 %.] IV cath removed site c/d/I, pt sent home with extra dressings, sent home with dme and all d/c instructions pt verbalized understanding of information provided. Pt d/c with all belongings and transported via w.c to private car.

## 2021-10-05 NOTE — Progress Notes (Signed)
°  Subjective: 2 Days Post-Op Procedure(s) (LRB): TOTAL HIP ARTHROPLASTY (Right) Daughter at bedside. Patient reports pain as well-controlled.   Patient is well, and has had no acute complaints or problems Plan is to go Home after hospital stay. Negative for chest pain and shortness of breath Fever: no Gastrointestinal: negative for nausea and vomiting.  Patient has not had a bowel movement.  Objective: Vital signs in last 24 hours: Temp:  [97.5 F (36.4 C)-98.4 F (36.9 C)] 98.2 F (36.8 C) (02/03 0350) Pulse Rate:  [73-80] 80 (02/03 0350) Resp:  [16-18] 16 (02/03 0350) BP: (114-125)/(64-79) 115/64 (02/03 0350) SpO2:  [97 %-99 %] 97 % (02/03 0350)  Intake/Output from previous day:  Intake/Output Summary (Last 24 hours) at 10/05/2021 0827 Last data filed at 10/05/2021 0720 Gross per 24 hour  Intake --  Output 430 ml  Net -430 ml    Intake/Output this shift: Total I/O In: -  Out: 100 [Urine:100]  Labs: Recent Labs    10/03/21 1232  HGB 12.2   Recent Labs    10/03/21 1232  HCT 36.0   Recent Labs    10/03/21 1232  NA 140  K 3.6  CL 105  BUN 19  CREATININE 0.70  GLUCOSE 84   No results for input(s): LABPT, INR in the last 72 hours.   EXAM General - Patient is Alert, Appropriate, and Oriented Extremity - Neurovascular intact Dorsiflexion/Plantar flexion intact Compartment soft Dressing/Incision -clean, dry, no drainage Motor Function - intact, moving foot and toes well on exam.  Cardiovascular- Regular rate and rhythm, no murmurs/rubs/gallops Respiratory- Lungs clear to auscultation bilaterally Gastrointestinal- soft, nontender, and active bowel sounds   Assessment/Plan: 2 Days Post-Op Procedure(s) (LRB): TOTAL HIP ARTHROPLASTY (Right) Principal Problem:   Hx of total hip arthroplasty, right  Estimated body mass index is 35.61 kg/m as calculated from the following:   Height as of this encounter: 5\' 3"  (1.6 m).   Weight as of this encounter: 91.2  kg. Advance diet Up with therapy  Plan for d/c this PM pending completion of therapy goals and BM     DVT Prophylaxis - Lovenox, Ted hose, and SCDs Weight-Bearing as tolerated to right leg  , PA-C [facility] Orthopaedic Surgery 10/05/2021, 8:27 AM

## 2021-10-05 NOTE — Progress Notes (Signed)
Occupational Therapy Treatment Patient Details Name: Latasha [name] MRN: [id number] DOB: 19-Feb-1961 Today's Date: 10/05/2021   History of present illness 61 yo female s/p R THA on 10/03/21. PmHx: Arthritis, Endometriosis, HTN, Osteoprosis, Anxiety, and GERD.   OT comments  Pt seen for OT tx this date to f/u re: safety with ADLs/ADL mobility. OT covers 3 THPs again with pt (she is able to recall 1/3 at start of session w/o cues and 3/3 end of session after re-education). OT engages pt in LB ADLs with MOD A and re-educates pt and dtr re: use of AE. OT engages pt fxl mobility with RW and SUPV to restroom as pt would like to try to have a BM. She demos good control and F standing balance with UE support. Only requires one cue for use of grab bra correctly in restroom. Pt left seated on [facility] with her daughter present and LPN notified as pt requests time to attempt BM. Left with call light in reach. Will continue to follow. Continue to anticipate that pt will require HHOT f/u.   Recommendations for follow up therapy are one component of a multi-disciplinary discharge planning process, led by the attending physician.  Recommendations may be updated based on patient status, additional functional criteria and insurance authorization.    Follow Up Recommendations  Home health OT    Assistance Recommended at Discharge Frequent or constant Supervision/Assistance  Patient can return home with the following  A little help with bathing/dressing/bathroom;Assistance with cooking/housework;Assist for transportation;Help with stairs or ramp for entrance   Equipment Recommendations  BSC/3in1;Tub/shower seat    Recommendations for Other Services      Precautions / Restrictions Precautions Precautions: Posterior Hip;Fall Precaution Booklet Issued: No Restrictions Weight Bearing Restrictions: Yes RLE Weight Bearing: Weight bearing as tolerated       Mobility Bed Mobility               General bed  mobility comments: pt up in chair at start/end of session    Transfers Overall transfer level: Needs assistance Equipment used: Rolling walker (2 wheels) Transfers: Sit to/from Stand Sit to Stand: Supervision                 Balance     Sitting balance-Leahy Scale: Good       Standing balance-Leahy Scale: Fair                             ADL either performed or assessed with clinical judgement   ADL Overall ADL's : Needs assistance/impaired                     Lower Body Dressing: Moderate assistance Lower Body Dressing Details (indicate cue type and reason): socks Toilet Transfer: Supervision/safety;Rolling walker (2 wheels);Ambulation;BSC/3in1 [employer] forces technical officer Details (indicate cue type and reason): BSC in restroom to elevate seat height   Toileting - Clothing Manipulation Details (indicate cue type and reason): pt did not participate in peri care with OT, requests to sit and try to have BM, aware of call light use, her daughter is in the room while pt seated in restroom, LPN notified.     Functional mobility during ADLs: Supervision/safety;Rolling walker (2 wheels)      Extremity/Trunk Assessment              Vision       Perception     Praxis      Cognition Arousal/Alertness: Awake/alert  Behavior During Therapy: Anxious Overall Cognitive Status: Within Functional Limits for tasks assessed                                 General Comments: reports being groggy from pain medication and PT session        Exercises Other Exercises Other Exercises: OT re-ed with pt and dtr regarding LB ADL task modification including use of AE, 3 THPs (pt able to recall 1/3 initially and 3/3 at end of session).    Shoulder Instructions       General Comments      Pertinent Vitals/ Pain       Pain Assessment Pain Assessment: 0-10 Pain Score: 4  Pain Location: r hip at rest, endorsed some pain but mostly fatigue once up and  moving Pain Descriptors / Indicators: Aching, Discomfort, Grimacing, Sore Pain Intervention(s): Limited activity within patient's tolerance, Monitored during session, Repositioned  Home Living                                          Prior Functioning/Environment              Frequency  Min 2X/week        Progress Toward Goals  OT Goals(current goals can now be found in the care plan section)  Progress towards OT goals: Progressing toward goals  Acute Rehab OT Goals Patient Stated Goal: to get better and go home OT Goal Formulation: With patient/family Time For Goal Achievement: 10/18/21 Potential to Achieve Goals: Good  Plan Discharge plan remains appropriate    Co-evaluation                 AM-PAC OT "6 Clicks" Daily Activity     Outcome Measure   Help from another person eating meals?: None Help from another person taking care of personal grooming?: A Little Help from another person toileting, which includes using toliet, bedpan, or urinal?: A Lot Help from another person bathing (including washing, rinsing, drying)?: A Lot Help from another person to put on and taking off regular upper body clothing?: A Little Help from another person to put on and taking off regular lower body clothing?: A Lot 6 Click Score: 16    End of Session Equipment Utilized During Treatment: Gait belt;Rolling walker (2 wheels)  OT Visit Diagnosis: Other abnormalities of gait and mobility (R26.89);Muscle weakness (generalized) (M62.81)   Activity Tolerance Patient tolerated treatment well   Patient Left Other (comment) (in restroom)   Nurse Communication Mobility status        Time: [id number] OT Time Calculation (min): 23 min  Charges: OT General Charges $OT Visit: 1 Visit OT Treatments $Self Care/Home Management : 8-22 mins $Therapeutic Activity: 8-22 mins  Gerrianne Scale, [place], OTR/L ascom [phone] 10/05/21, 4:41 PM

## 2021-10-05 NOTE — Progress Notes (Signed)
Physical Therapy Treatment Patient Details Name: Latasha [name] MRN: [id number] DOB: 1960/12/30 Today's Date: 10/05/2021   History of Present Illness 61 yo female s/p R THA on 10/03/21. PmHx: Arthritis, Endometriosis, HTN, Osteoprosis, Anxiety, and GERD.    PT Comments    Patient alert, up in chair, agreeable to PT session. Session focused on stair training, so pt able to ride down to rehab gym. She was able to perform 4 steps with R rail, CGA, no cues for technique needed. Pt self limiting, anxious, and reported fatigue, encouragement provided as needed. She was then able to ambulate ~157ft with RW and supervision. Returned to room with all needs in reach. The patient would benefit from further skilled PT intervention to continue to progress towards goals. Recommendation remains appropriate.     Recommendations for follow up therapy are one component of a multi-disciplinary discharge planning process, led by the attending physician.  Recommendations may be updated based on patient status, additional functional criteria and insurance authorization.  Follow Up Recommendations  Home health PT     Assistance Recommended at Discharge Intermittent Supervision/Assistance  Patient can return home with the following A little help with walking and/or transfers;A little help with bathing/dressing/bathroom;Assistance with cooking/housework;Assist for transportation;Help with stairs or ramp for entrance   Equipment Recommendations  Rolling walker (2 wheels)    Recommendations for Other Services       Precautions / Restrictions Precautions Precautions: Posterior Hip;Fall Precaution Booklet Issued: No Restrictions Weight Bearing Restrictions: Yes RLE Weight Bearing: Weight bearing as tolerated     Mobility  Bed Mobility Overal bed mobility: Needs Assistance Bed Mobility: Supine to Sit     Supine to sit: Min assist     General bed mobility comments: pt up in chair at start/end of  session    Transfers Overall transfer level: Needs assistance Equipment used: Rolling walker (2 wheels) Transfers: Sit to/from Stand Sit to Stand: Supervision                Ambulation/Gait Ambulation/Gait assistance: Supervision Gait Distance (Feet): 150 Feet Assistive device: Rolling walker (2 wheels) Gait Pattern/deviations: Step-through pattern, Decreased step length - right, Decreased step length - left, Decreased stride length Gait velocity: decreased     General Gait Details: improved cadence and confidence noted this PM   Stairs Stairs: Yes Stairs assistance: Min guard Stair Management: Step to pattern, One rail Right Number of Stairs: 4 General stair comments: pt able to verbalize and perform without cueing from PT. pt self limiting but utlimately completed without physical assist   Wheelchair Mobility    Modified Rankin (Stroke Patients Only)       Balance Overall balance assessment: Needs assistance Sitting-balance support: Feet supported Sitting balance-Leahy Scale: Fair     Standing balance support: Bilateral upper extremity supported, During functional activity Standing balance-Leahy Scale: Fair                              Cognition Arousal/Alertness: Awake/alert Behavior During Therapy: Anxious Overall Cognitive Status: Within Functional Limits for tasks assessed                                 General Comments: pt reported fatigue from AM session        Exercises Total Joint Exercises Ankle Circles/Pumps: AROM, Both, 10 reps Quad Sets: AROM, Both, 10 reps Gluteal Sets: AROM, Both, 10  reps Hip ABduction/ADduction: AAROM, Strengthening, Right, 10 reps    General Comments        Pertinent Vitals/Pain Pain Assessment Pain Assessment: 0-10 Pain Score: 0-No pain Pain Location: r hip at rest, endorsed some pain but mostly fatigue once up and moving Pain Descriptors / Indicators: Aching, Discomfort,  Grimacing, Sore Pain Intervention(s): Limited activity within patient's tolerance, Monitored during session, Repositioned, Ice applied    Home Living                          Prior Function            PT Goals (current goals can now be found in the care plan section) Progress towards PT goals: Progressing toward goals    Frequency    BID      PT Plan Current plan remains appropriate    Co-evaluation              AM-PAC PT "6 Clicks" Mobility   Outcome Measure  Help needed turning from your back to your side while in a flat bed without using bedrails?: A Little Help needed moving from lying on your back to sitting on the side of a flat bed without using bedrails?: A Little Help needed moving to and from a bed to a chair (including a wheelchair)?: None Help needed standing up from a chair using your arms (e.g., wheelchair or bedside chair)?: None Help needed to walk in hospital room?: A Little Help needed climbing 3-5 steps with a railing? : A Little (increased time due to thinking) 6 Click Score: 20    End of Session Equipment Utilized During Treatment: Gait belt Activity Tolerance: Patient tolerated treatment well;Patient limited by fatigue;No increased pain Patient left: in chair;with call bell/phone within reach;with chair alarm set Nurse Communication: Mobility status PT Visit Diagnosis: Unsteadiness on feet (R26.81);Pain;Muscle weakness (generalized) (M62.81);Other abnormalities of gait and mobility (R26.89) Pain - Right/Left: Right Pain - part of body: Hip     Time: 7619-5093 PT Time Calculation (min) (ACUTE ONLY): 25 min  Charges:  $Gait Training: 8-22 mins $Therapeutic Exercise: 8-22 mins $Therapeutic Activity: 8-22 mins                     Olga Coaster PT, DPT 2:41 PM,10/05/21

## 2021-10-05 NOTE — TOC Progression Note (Signed)
Transition of Care [facility]) - Progression Note    Patient Details  Name: Latasha [name] MRN: [id number] Date of Birth: [birth year]  Transition of [facility]) CM/SW [place], RN Phone Number: 10/05/2021, 11:24 AM  Clinical Narrative:     Met with the patient to confirm the SM she needs and wants, [employer] brought the 3 in 1 and the RW to the room., She doe snot want the RW, [employer] will ick up, she wants to keep the 3 in 1, [facility] is set up For [facility] services  Expected Discharge Plan: [place] Barriers to Discharge: Continued Medical Work up  Expected Discharge Plan and Services Expected Discharge Plan: [place]   Discharge Planning Services: CM Consult   Living arrangements for the past 2 months: Single Family Home                 DME Arranged: Walker rolling, 3-N-1 DME Agency: [employer] Date DME Agency Contacted: 10/04/21 Time DME Agency Contacted: 72 Representative spoke with at DME Agency: intake HH Arranged: PT [place]: [place] Date [place]: 10/04/21 Time [place]: 12 Representative spoke with at [place]: [place]   Social Determinants of Health ([place]) Interventions    Readmission Risk Interventions No flowsheet data found.

## 2021-10-05 NOTE — Plan of Care (Signed)

## 2021-10-05 NOTE — Progress Notes (Signed)
Physical Therapy Treatment Patient Details Name: Latasha [name] MRN: [id number] DOB: 07/02/1961 Today's Date: 10/05/2021   History of Present Illness 61 yo female s/p R THA on 10/03/21. PmHx: Arthritis, Endometriosis, HTN, Osteoprosis, Anxiety, and GERD.    PT Comments    Patient alert, agreeable to PT, reported 2/10 R hip pain. Supine to sit with very light minA to assist with weight shifting. Sit <> stand several times during session, supervision with RW, pt with excellent recall of technique. She was able to ambulate ~225ft total with RW and CGA-supervision (2 seated rest breaks). She also performed stair navigation with CGA, able to teach back method. Returned to room with all needs in reach. The patient would benefit from further skilled PT intervention to continue to progress towards goals. Recommendation remains appropriate.       Recommendations for follow up therapy are one component of a multi-disciplinary discharge planning process, led by the attending physician.  Recommendations may be updated based on patient status, additional functional criteria and insurance authorization.  Follow Up Recommendations  Home health PT     Assistance Recommended at Discharge Intermittent Supervision/Assistance  Patient can return home with the following A little help with walking and/or transfers;A little help with bathing/dressing/bathroom;Assistance with cooking/housework;Assist for transportation;Help with stairs or ramp for entrance   Equipment Recommendations  Rolling walker (2 wheels)    Recommendations for Other Services       Precautions / Restrictions Precautions Precautions: Posterior Hip;Fall Precaution Booklet Issued: Yes (comment) Restrictions Weight Bearing Restrictions: Yes RLE Weight Bearing: Weight bearing as tolerated     Mobility  Bed Mobility Overal bed mobility: Needs Assistance Bed Mobility: Supine to Sit     Supine to sit: Min assist     General bed  mobility comments: very light minA for weight shift to R side of bed due to patient reported fatigue    Transfers Overall transfer level: Needs assistance Equipment used: Rolling walker (2 wheels) Transfers: Sit to/from Stand Sit to Stand: Supervision                Ambulation/Gait Ambulation/Gait assistance: Supervision, Min guard Gait Distance (Feet): 220 Feet (2 seated rest breaks) Assistive device: Rolling walker (2 wheels) Gait Pattern/deviations: Step-through pattern, Decreased step length - right, Decreased step length - left, Decreased stride length Gait velocity: decreased         Stairs   Stairs assistance: Min guard Stair Management: Step to pattern, One rail Right Number of Stairs: 4 General stair comments: pt able to verbalize stair technique, teach back, and perform without cueing from PT   Wheelchair Mobility    Modified Rankin (Stroke Patients Only)       Balance Overall balance assessment: Needs assistance Sitting-balance support: Feet supported Sitting balance-Leahy Scale: Fair     Standing balance support: Bilateral upper extremity supported, During functional activity Standing balance-Leahy Scale: Fair                              Cognition Arousal/Alertness: Awake/alert Behavior During Therapy: Anxious Overall Cognitive Status: Within Functional Limits for tasks assessed                                 General Comments: Pt reports still feeling groggy today        Exercises Total Joint Exercises Ankle Circles/Pumps: AROM, Both, 10 reps Quad Sets: AROM,  Both, 10 reps Gluteal Sets: AROM, Both, 10 reps Hip ABduction/ADduction: AAROM, Strengthening, Right, 10 reps    General Comments        Pertinent Vitals/Pain Pain Assessment Pain Assessment: 0-10 Pain Score: 2  Pain Location: r hip Pain Descriptors / Indicators: Aching, Discomfort, Grimacing, Sore Pain Intervention(s): Limited activity within  patient's tolerance, Monitored during session, Repositioned, Ice applied    Home Living                          Prior Function            PT Goals (current goals can now be found in the care plan section) Progress towards PT goals: Progressing toward goals    Frequency    BID      PT Plan Current plan remains appropriate    Co-evaluation              AM-PAC PT "6 Clicks" Mobility   Outcome Measure  Help needed turning from your back to your side while in a flat bed without using bedrails?: A Little Help needed moving from lying on your back to sitting on the side of a flat bed without using bedrails?: A Little Help needed moving to and from a bed to a chair (including a wheelchair)?: None Help needed standing up from a chair using your arms (e.g., wheelchair or bedside chair)?: None Help needed to walk in hospital room?: A Little Help needed climbing 3-5 steps with a railing? : A Little (increased time due to thinking) 6 Click Score: 20    End of Session Equipment Utilized During Treatment: Gait belt Activity Tolerance: Patient tolerated treatment well;Patient limited by fatigue;No increased pain Patient left: in chair;with call bell/phone within reach;with chair alarm set Nurse Communication: Mobility status PT Visit Diagnosis: Unsteadiness on feet (R26.81);Pain;Muscle weakness (generalized) (M62.81);Other abnormalities of gait and mobility (R26.89) Pain - Right/Left: Right Pain - part of body: Hip     Time: 1607-3710 PT Time Calculation (min) (ACUTE ONLY): 39 min  Charges:  $Gait Training: 8-22 mins $Therapeutic Exercise: 8-22 mins $Therapeutic Activity: 8-22 mins                     Olga Coaster PT, DPT 11:08 AM,10/05/21

## 2022-01-16 ENCOUNTER — Other Ambulatory Visit: Payer: Self-pay | Admitting: Internal Medicine

## 2022-01-16 DIAGNOSIS — Z1231 Encounter for screening mammogram for malignant neoplasm of breast: Secondary | ICD-10-CM

## 2022-02-11 ENCOUNTER — Other Ambulatory Visit: Payer: Self-pay | Admitting: Internal Medicine

## 2022-02-11 DIAGNOSIS — M5416 Radiculopathy, lumbar region: Secondary | ICD-10-CM

## 2022-02-15 ENCOUNTER — Ambulatory Visit
Admission: RE | Admit: 2022-02-15 | Discharge: 2022-02-15 | Disposition: A | Discharge status: Home / Self Care | Payer: BC Managed Care – PPO | Source: Ambulatory Visit | Attending: Internal Medicine | Admitting: Internal Medicine

## 2022-02-15 DIAGNOSIS — Z1231 Encounter for screening mammogram for malignant neoplasm of breast: Secondary | ICD-10-CM | POA: Insufficient documentation

## 2022-08-07 ENCOUNTER — Other Ambulatory Visit: Payer: Self-pay | Admitting: Internal Medicine

## 2022-08-07 DIAGNOSIS — D500 Iron deficiency anemia secondary to blood loss (chronic): Secondary | ICD-10-CM

## 2022-08-07 DIAGNOSIS — Z0000 Encounter for general adult medical examination without abnormal findings: Secondary | ICD-10-CM

## 2022-08-07 DIAGNOSIS — R109 Unspecified abdominal pain: Secondary | ICD-10-CM

## 2022-08-15 ENCOUNTER — Other Ambulatory Visit: Payer: BC Managed Care – PPO

## 2022-08-16 ENCOUNTER — Ambulatory Visit
Admission: RE | Admit: 2022-08-16 | Discharge: 2022-08-16 | Disposition: A | Discharge status: Home / Self Care | Payer: BC Managed Care – PPO | Source: Ambulatory Visit | Attending: Internal Medicine | Admitting: Internal Medicine

## 2022-08-16 DIAGNOSIS — Z0000 Encounter for general adult medical examination without abnormal findings: Secondary | ICD-10-CM | POA: Insufficient documentation

## 2022-08-16 DIAGNOSIS — D500 Iron deficiency anemia secondary to blood loss (chronic): Secondary | ICD-10-CM

## 2022-08-16 DIAGNOSIS — R109 Unspecified abdominal pain: Secondary | ICD-10-CM | POA: Insufficient documentation

## 2022-09-05 ENCOUNTER — Other Ambulatory Visit: Payer: Self-pay | Admitting: Internal Medicine

## 2022-09-05 DIAGNOSIS — R918 Other nonspecific abnormal finding of lung field: Secondary | ICD-10-CM

## 2022-09-05 DIAGNOSIS — D500 Iron deficiency anemia secondary to blood loss (chronic): Secondary | ICD-10-CM

## 2022-10-24 ENCOUNTER — Encounter: Admission: RE | Payer: Self-pay | Source: Home / Self Care

## 2022-10-24 ENCOUNTER — Ambulatory Visit
Admission: RE | Admit: 2022-10-24 | Payer: BC Managed Care – PPO | Source: Home / Self Care | Admitting: Gastroenterology

## 2022-10-24 SURGERY — COLONOSCOPY WITH PROPOFOL
Anesthesia: General

## 2023-04-01 ENCOUNTER — Other Ambulatory Visit: Payer: Self-pay | Admitting: Internal Medicine

## 2023-04-01 DIAGNOSIS — Z1231 Encounter for screening mammogram for malignant neoplasm of breast: Secondary | ICD-10-CM

## 2023-04-09 ENCOUNTER — Ambulatory Visit
Admission: RE | Admit: 2023-04-09 | Discharge: 2023-04-09 | Disposition: A | Discharge status: Home / Self Care | Payer: BC Managed Care – PPO | Source: Ambulatory Visit | Attending: Internal Medicine | Admitting: Internal Medicine

## 2023-04-09 DIAGNOSIS — Z1231 Encounter for screening mammogram for malignant neoplasm of breast: Secondary | ICD-10-CM | POA: Insufficient documentation

## 2023-05-21 ENCOUNTER — Ambulatory Visit: Payer: BC Managed Care – PPO | Admitting: Sports Medicine

## 2023-05-21 ENCOUNTER — Ambulatory Visit: Payer: BC Managed Care – PPO

## 2023-05-21 ENCOUNTER — Encounter: Payer: Self-pay | Admitting: Sports Medicine

## 2023-05-21 DIAGNOSIS — M654 Radial styloid tenosynovitis [de Quervain]: Secondary | ICD-10-CM

## 2023-05-21 DIAGNOSIS — M25531 Pain in right wrist: Secondary | ICD-10-CM

## 2023-05-21 MED ORDER — PREDNISONE 50 MG PO TABS
ORAL_TABLET | ORAL | 0 refills | Status: AC
Start: 2023-05-21 — End: ?

## 2023-05-21 NOTE — Assessment & Plan Note (Signed)
This is a very pleasant 62 year old female, she is having recurrence of right sided radial aspect wrist pain, we saw her about 2 years ago and treated her successfully with a burst of prednisone, etodolac and home PT. X-rays did show some osteoarthritis and chondrocalcinosis. Today she has swelling discretely over the first extensor compartment, she has tenderness on the first extensor compartment, a positive Finkelstein sign. We discussed the anatomy and pathophysiology, adding thumb spica brace, home PT, updated x-rays, prednisone, return to see me in 4 to 6 weeks, first extensor compartment injection if not better.

## 2023-05-21 NOTE — Progress Notes (Signed)
    Procedures performed today:    None.  Independent interpretation of notes and tests performed by another provider:   None.  Brief History, Exam, Impression, and Recommendations:    De Quervain's tenosynovitis, right This is a very pleasant 62 year old female, she is having recurrence of right sided radial aspect wrist pain, we saw her about 2 years ago and treated her successfully with a burst of prednisone, etodolac and home PT. X-rays did show some osteoarthritis and chondrocalcinosis. Today she has swelling discretely over the first extensor compartment, she has tenderness on the first extensor compartment, a positive Finkelstein sign. We discussed the anatomy and pathophysiology, adding thumb spica brace, home PT, updated x-rays, prednisone, return to see me in 4 to 6 weeks, first extensor compartment injection if not better.  Chronic process with exacerbation and pharmacologic intervention  ____________________________________________ Ihor Austin. Benjamin Stain, M.D., ABFM., CAQSM., AME. Primary Care and Sports Medicine [place] [facility]  Adjunct Professor of Family Medicine  [employer]  Restaurant manager, [employer]

## 2023-06-03 ENCOUNTER — Telehealth: Payer: Self-pay | Admitting: Sports Medicine

## 2023-06-03 DIAGNOSIS — M654 Radial styloid tenosynovitis [de Quervain]: Secondary | ICD-10-CM

## 2023-06-03 MED ORDER — PREDNISONE 10 MG (48) PO TBPK: ORAL_TABLET | Freq: Every day | ORAL | 0 refills | Status: DC

## 2023-06-03 NOTE — Telephone Encounter (Signed)
Yes we can do another course but to protect her adrenal glands it will need to be a 12-day taper.

## 2023-06-03 NOTE — Telephone Encounter (Signed)
Patient called she is wondering if she could possibly get some more Prednisone 50mg  for her right wrist she is doing well she says doing what she is suppose to but feels like maybe one more week of Prednisone may do the trick pharmacy she uses is [facility] on [address] [facility]  Phone number [phone] She also hasn't heard from x-ray about her results

## 2023-06-26 ENCOUNTER — Ambulatory Visit: Payer: BC Managed Care – PPO | Admitting: Sports Medicine

## 2023-08-21 DIAGNOSIS — R911 Solitary pulmonary nodule: Secondary | ICD-10-CM | POA: Insufficient documentation

## 2023-08-22 ENCOUNTER — Other Ambulatory Visit: Payer: Self-pay | Admitting: Internal Medicine

## 2023-08-22 DIAGNOSIS — Z0000 Encounter for general adult medical examination without abnormal findings: Secondary | ICD-10-CM

## 2023-08-22 DIAGNOSIS — R911 Solitary pulmonary nodule: Secondary | ICD-10-CM

## 2023-08-26 ENCOUNTER — Other Ambulatory Visit: Payer: Self-pay | Admitting: Internal Medicine

## 2023-08-26 DIAGNOSIS — R911 Solitary pulmonary nodule: Secondary | ICD-10-CM

## 2023-08-26 DIAGNOSIS — Z0000 Encounter for general adult medical examination without abnormal findings: Secondary | ICD-10-CM

## 2023-09-05 ENCOUNTER — Ambulatory Visit: Payer: Medicare PPO

## 2023-10-03 ENCOUNTER — Ambulatory Visit
Admission: RE | Admit: 2023-10-03 | Discharge: 2023-10-03 | Disposition: A | Discharge status: Home / Self Care | Payer: Medicare PPO | Source: Ambulatory Visit | Attending: Internal Medicine | Admitting: Internal Medicine

## 2023-10-03 DIAGNOSIS — R911 Solitary pulmonary nodule: Secondary | ICD-10-CM | POA: Diagnosis present

## 2023-10-03 DIAGNOSIS — Z0000 Encounter for general adult medical examination without abnormal findings: Secondary | ICD-10-CM | POA: Diagnosis present

## 2023-12-30 ENCOUNTER — Encounter (HOSPITAL_BASED_OUTPATIENT_CLINIC_OR_DEPARTMENT_OTHER): Payer: Self-pay | Admitting: Physical Therapy

## 2023-12-30 ENCOUNTER — Ambulatory Visit (HOSPITAL_BASED_OUTPATIENT_CLINIC_OR_DEPARTMENT_OTHER): Attending: Student | Admitting: Physical Therapy

## 2023-12-30 ENCOUNTER — Other Ambulatory Visit: Payer: Self-pay

## 2023-12-30 DIAGNOSIS — M25561 Pain in right knee: Secondary | ICD-10-CM | POA: Insufficient documentation

## 2023-12-30 DIAGNOSIS — R293 Abnormal posture: Secondary | ICD-10-CM | POA: Insufficient documentation

## 2023-12-30 DIAGNOSIS — G8929 Other chronic pain: Secondary | ICD-10-CM | POA: Insufficient documentation

## 2023-12-30 DIAGNOSIS — R269 Unspecified abnormalities of gait and mobility: Secondary | ICD-10-CM | POA: Diagnosis present

## 2023-12-30 DIAGNOSIS — M25562 Pain in left knee: Secondary | ICD-10-CM | POA: Insufficient documentation

## 2023-12-30 DIAGNOSIS — M6281 Muscle weakness (generalized): Secondary | ICD-10-CM | POA: Diagnosis present

## 2023-12-30 NOTE — Therapy (Signed)
 OUTPATIENT PHYSICAL THERAPY LOWER EXTREMITY EVALUATION   Patient Name: Latasha [name] MRN: [id number] DOB:[birth year], 63 y.o., female Today's Date: 12/30/2023  END OF SESSION:  PT End of Session - 12/30/23 1819     Visit Number 1    Number of Visits 12    Date for PT Re-Evaluation 02/27/24    PT Start Time 1401    PT Stop Time 1444    PT Time Calculation (min) 43 min    Activity Tolerance Patient tolerated treatment well    Behavior During Therapy WFL for tasks assessed/performed             Past Medical History:  Diagnosis Date   Anxiety    Arthritis    Chronic fatigue    Endometriosis    Family history of osteoporosis    GERD (gastroesophageal reflux disease)    Heart murmur    History of kidney stones    Hypertension    IBS (irritable bowel syndrome)    Obesity    Surgical menopause    Vaginal dryness    Past Surgical History:  Procedure Laterality Date   ABDOMINAL HYSTERECTOMY     [place]/bso   TONSILLECTOMY  1976   TOTAL HIP ARTHROPLASTY Right 10/03/2021   Procedure: TOTAL HIP ARTHROPLASTY;  Surgeon: Arlyne Lame, MD;  Location: [facility] ORS;  Service: Orthopedics;  Laterality: Right;   Patient Active Problem List   Diagnosis Date Noted   Hx of total hip arthroplasty, right 10/03/2021   De Quervain's tenosynovitis, right 09/06/2020   BP (high blood pressure) 09/05/2015   Osteoporosis, post-menopausal 09/05/2015   Postsurgical menopause 09/05/2015   Class 1 obesity 09/05/2015   GERD (gastroesophageal reflux disease) 09/05/2015   Endometriosis 09/05/2015   Status post total hysterectomy and bilateral salpingo-oophorectomy 09/05/2015   Primary osteoarthritis of both knees 09/28/2014    PCP: [name] MD/  [name], PA-C    REFERRING PROVIDER: [name] MD  REFERRING DIAG: M17.0 (ICD-10-CM) - Bilateral primary osteoarthritis of knee   THERAPY DIAG:  Chronic pain of both knees  Gait abnormality  Muscle weakness  (generalized)  Abnormal posture  Rationale for Evaluation and Treatment: Rehabilitation  ONSET DATE: 10 yrs +  SUBJECTIVE:   SUBJECTIVE STATEMENT: Planned TKR June 30 by Dr [name].  I signed up [employer] to get pool access.  Knee pain has begun "playing with my mobility".  I was a walker but am unable to go now.  Also have OA left shoulder may need total shoulder.  Had injection 1 week ago in shoulder has reduced pain.  Not going to do anything now focusing on knee.  PERTINENT HISTORY: Bilateral shoulder pain. 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 Yes: NPRS scale: current 0/10; 3-4/10  Pain location: left knee Pain description: ache Aggravating factors: walking, weight baring Relieving factors: rest  PRECAUTIONS: None  RED FLAGS: None   WEIGHT BEARING RESTRICTIONS: No  FALLS:  Has patient fallen in last 6 months? No  LIVING ENVIRONMENT: Lives with: lives with their family Lives in: House/apartment Stairs:  5 steps to exit home handrail Has following equipment at home: Single point cane  OCCUPATION: retired  PLOF: Requires assistive device for independence  PATIENT GOALS: get back to walking, dance at sons wedding in [place],  [place] MD VISIT:  2 weeks  OBJECTIVE:  Note: Objective measures were completed at Evaluation unless otherwise noted.  DIAGNOSTIC FINDINGS: OA bilat knee  PATIENT SURVEYS:  LEFS  33/80  COGNITION: Overall cognitive status: Within functional limits for tasks assessed     SENSATION: WFL  EDEMA:  Slight pitted edema +1 left ankle, slight diffuse edema throughout left knee   MUSCLE LENGTH: Hamstrings: wfl   POSTURE: rounded shoulders, weight shift right, and bilat valgus deformities (left ~10d)  PALPATION: TTP about medial left knee joint line  LOWER EXTREMITY ROM:   10 degree left valgus deformity Active ROM Right eval Left eval  Hip flexion    Hip extension    Hip abduction    Hip adduction     Hip internal rotation    Hip external rotation    Knee flexion 113 106  Knee extension 0 0  Ankle dorsiflexion    Ankle plantarflexion    Ankle inversion    Ankle eversion     (Blank rows = not tested)  LOWER EXTREMITY MMT:  HD lbs Right eval Left eval  Hip flexion 45.0 38.8  Hip extension    Hip abduction 32.5 34.1  Hip adduction    Hip internal rotation    Hip external rotation    Knee flexion    Knee extension 34.4 34.1  Ankle dorsiflexion    Ankle plantarflexion    Ankle inversion    Ankle eversion     (Blank rows = not tested)   FUNCTIONAL TESTS:  Timed up and go (TUG): 22.17 4 stage balance: passed 1&2; tandem x 8s; SLS x 2s  GAIT: Distance walked: 400 ft Assistive device utilized: Single point cane Level of assistance: Complete Independence Comments: Bilat knee valgus deformities; slight hip circumduction to allow for advancement of LE; knees knocking, increased lateral displacement                                                                                                                                TREATMENT Eval Self care: use of ad; preparing for surgery; expectations of surgery; transfers STS from sofa/commode,   PATIENT EDUCATION:  Education details: Discussed eval findings, rehab rationale, aquatic program progression/POC and pools in area. Patient is in agreement  Person educated: Patient Education method: Explanation Education comprehension: verbalized understanding  HOME EXERCISE PROGRAM: TBA  ASSESSMENT:  CLINICAL IMPRESSION: Patient is a 63 y.o. f who was seen today for physical therapy evaluation and treatment for Bilateral primary osteoarthritis of knee . She presents today with gross bilateral knee valgus deformities impairing gait using a SPC. She is preparing for left TKR at the end of June and will likely follow with right before end of year.  She is a high fall risk due to balance deficits, she does have some muscle  weakness and demonstrates difficulty with transfers due to progressed OA.  Her goals are to improve her toleration to activity and eventually return to walking up toward 2 miles.  She is a good candidate for aquatic therapy intervention to improve strength, maintain/progress bilat knee ROM, improve balance and safety  with amb and prepare for upcoming surgical procedure. Will plan to conider land based interventionas therapy progresses.  OBJECTIVE IMPAIRMENTS: Abnormal gait, decreased balance, decreased endurance, decreased mobility, difficulty walking, decreased ROM, decreased strength, obesity, and pain.   ACTIVITY LIMITATIONS: carrying, lifting, bending, sitting, standing, squatting, sleeping, stairs, transfers, and locomotion level  PARTICIPATION LIMITATIONS: meal prep, cleaning, laundry, shopping, and community activity  PERSONAL FACTORS:  s ee PmHx are also affecting patient's functional outcome.   REHAB POTENTIAL: Good  CLINICAL DECISION MAKING: Stable/uncomplicated  EVALUATION COMPLEXITY: Low   GOALS: Goals reviewed with patient? Yes  SHORT TERM GOALS: Target date: 01/20/24 Pt will tolerate full aquatic sessions consistently without increase in pain and with improving function to demonstrate good toleration and effectiveness of intervention.  Baseline: Goal status: INITIAL  2.  Pt will have initial aquatic HEP and will complete indep at personal pool access Baseline:  Goal status: INITIAL  3.  Pt will report 0/10 pain in knees with submerged activity Baseline:  Goal status: INITIAL  4.  Pt will tolerate 10+ minutes of continuous amb in upright position submerged to demonstrate improved toleration to amb Baseline: leaning forward on grocery cart. Goal status: INITIAL    LONG TERM GOALS: Target date: 02/27/24  Pt to improve on LEFS by at least 9 point to demonstrate statistically significant Improvement in function. Baseline:33/80  Goal status: INITIAL  2.  Pt will  improve on Tug test to <or= 15s to demonstrate improvement in lower extremity function, mobility and decreased fall risk. Baseline:22.17  Goal status: INITIAL  3.  Pt will perform SLS in 3.6 ft unsupported x 20s to demonstrate improved balance ability Baseline:  Goal status: INITIAL  4.  Pt will be indep with final HEP's (land and aquatic as appropriate) for continued management of condition Baseline:  Goal status: INITIAL  5.  Pt will report decrease in pain by at least 50% for improved toleration to activity/quality of life and to demonstrate improved management of pain. Baseline:  Goal status: INITIAL     PLAN:  PT FREQUENCY: 1-2x/week  PT DURATION: 8 weeks (extended due to scheduling conflicts)  PLANNED INTERVENTIONS: 86578- PT Re-evaluation, 97110-Therapeutic exercises, 97530- Therapeutic activity, 97112- Neuromuscular re-education, 97535- Self Care, 46962- Manual therapy, [phone]- Gait training, [phone]- Aquatic Therapy, [phone]- Electrical stimulation (unattended), [phone]- Ionotophoresis 4mg /ml Dexamethasone , Patient/Family education, Balance training, Stair training, Taping, Dry Needling, Joint mobilization, DME instructions, Cryotherapy, and Moist heat  PLAN FOR NEXT SESSION: aquatics: le stretching and strengthening, balance retraining, proprioceptive retraining, pain management, gait tolerance   Lucinda Saber) Reiley Bertagnolli MPT 12/30/23 6:22 PM [facility] [address] [place], [place], [address] Phone: [phone]   Fax:  [phone]   Referring diagnosis? M17.0 (ICD-10-CM) - Bilateral primary osteoarthritis of knee  Treatment diagnosis? (if different than referring diagnosis) no What was this (referring dx) caused by? []  Surgery []  Fall []  Ongoing issue [x]  Arthritis []  Other: ____________  Laterality: []  Rt []  Lt [x]  Both  Check all possible CPT codes:  *CHOOSE 10 OR LESS*    See Planned Interventions listed in  the Plan section of the Evaluation.

## 2023-12-31 ENCOUNTER — Encounter (INDEPENDENT_AMBULATORY_CARE_PROVIDER_SITE_OTHER): Payer: Self-pay | Admitting: Adult Health

## 2023-12-31 ENCOUNTER — Ambulatory Visit (INDEPENDENT_AMBULATORY_CARE_PROVIDER_SITE_OTHER): Payer: Self-pay | Admitting: Adult Health

## 2023-12-31 VITALS — BP 116/78 | HR 73 | Temp 98.6°F | Ht 63.0 in | Wt 208.0 lb

## 2023-12-31 DIAGNOSIS — E559 Vitamin D deficiency, unspecified: Secondary | ICD-10-CM | POA: Diagnosis not present

## 2023-12-31 DIAGNOSIS — E669 Obesity, unspecified: Secondary | ICD-10-CM | POA: Diagnosis not present

## 2023-12-31 DIAGNOSIS — Z6836 Body mass index (BMI) 36.0-36.9, adult: Secondary | ICD-10-CM

## 2023-12-31 DIAGNOSIS — K219 Gastro-esophageal reflux disease without esophagitis: Secondary | ICD-10-CM

## 2023-12-31 NOTE — Progress Notes (Signed)
 Office: [phone]  /  Fax: [phone]   Initial Visit  Latasha [name] was seen in clinic today to evaluate for obesity. She is interested in losing weight to improve overall health and reduce the risk of weight related complications. She presents today to review program treatment options, initial physical assessment, and evaluation.     Discussed the use of AI scribe software for clinical note transcription with the patient, who gave verbal consent to proceed.  History of Present Illness         She was referred by: Friend or Family  When asked what else they would like to accomplish? She states: Adopt healthier eating patterns, Improve energy levels and physical activity, Improve existing medical conditions, Reduce number of medications, Improve quality of life, Improve appearance, and Current Weight 208 lbs, goal weight 170s  When asked how has your weight affected you? She states: Contributed to medical problems, Contributed to orthopedic problems or mobility issues, Having fatigue, Having poor endurance, and Problems with eating patterns  Weight history: She endorses lifelong struggle with weight.    Some associated conditions: GERD  Contributing factors: Disruption of circadian rhythm / sleep disordered breathing, Consumption of processed foods, Moderate to high levels of stress, Reduced physical activity, and Eating patterns  Weight promoting medications identified: Psychotropic medications  Current nutrition plan: None  Current level of physical activity: None  Current or previous pharmacotherapy: Other: Tenuate Dosphan  Response to medication: Lost weight and was able to maintain weight loss   Past medical history includes:   Past Medical History:  Diagnosis Date   Anxiety    Arthritis    Chronic fatigue    Endometriosis    Family history of osteoporosis    GERD (gastroesophageal reflux disease)    Heart murmur    History of kidney stones     Hypertension    IBS (irritable bowel syndrome)    Obesity    Surgical menopause    Vaginal dryness      Objective:   BP 116/78   Pulse 73   Temp 98.6 F (37 C)   Ht 5\' 3"  (1.6 m)   Wt 208 lb (94.3 kg)   LMP  (LMP Unknown)   SpO2 98%   BMI 36.85 kg/m  She was weighed on the bioimpedance scale: Body mass index is 36.85 kg/m.  Peak Weight:208 , Body Fat%:48.3, Visceral Fat Rating:15, Weight trend over the last 12 months: Increasing  General:  Alert, oriented and cooperative. Patient is in no acute distress.  Respiratory: Normal respiratory effort, no problems with respiration noted   Gait: able to ambulate independently  Mental Status: Normal mood and affect. Normal behavior. Normal judgment and thought content.   DIAGNOSTIC DATA REVIEWED:  BMET    Component Value Date/Time   NA 140 10/03/2021 1232   K 3.6 10/03/2021 1232   CL 105 10/03/2021 1232   CO2 26 09/24/2021 1149   GLUCOSE 84 10/03/2021 1232   BUN 19 10/03/2021 1232   CREATININE 0.70 10/03/2021 1232   CALCIUM 9.2 09/24/2021 1149   GFRNONAA >60 09/24/2021 1149   No results found for: "HGBA1C" No results found for: "INSULIN" CBC    Component Value Date/Time   WBC 10.4 09/24/2021 1149   RBC 4.25 09/24/2021 1149   HGB 12.2 10/03/2021 1232   HCT 36.0 10/03/2021 1232   PLT 317 09/24/2021 1149   MCV 82.6 09/24/2021 1149   MCH 27.3 09/24/2021 1149   MCHC 33.0 09/24/2021 1149  RDW 14.6 09/24/2021 1149   Iron/TIBC/Ferritin/ %Sat No results found for: "IRON", "TIBC", "FERRITIN", "IRONPCTSAT" Lipid Panel  No results found for: "CHOL", "TRIG", "HDL", "CHOLHDL", "VLDL", "LDLCALC", "LDLDIRECT" Hepatic Function Panel     Component Value Date/Time   PROT 7.3 09/24/2021 1149   ALBUMIN 3.9 09/24/2021 1149   AST 14 (L) 09/24/2021 1149   ALT 13 09/24/2021 1149   ALKPHOS 123 09/24/2021 1149   BILITOT 0.7 09/24/2021 1149   No results found for: "TSH"   Assessment and Plan:   Vitamin D  deficiency  Gastroesophageal reflux disease, unspecified whether esophagitis present  Obesity (BMI 30-39.9), STARTING BMI 36.8   Assessment and Plan        ESTABLISH WITH [facility]   Obesity Treatment / Action Plan:  Patient will work on garnering support from family and friends to begin weight loss journey. Will work on eliminating or reducing the presence of highly palatable, calorie dense foods in the home. Will complete provided nutritional and psychosocial assessment questionnaire before the next appointment. Will be scheduled for indirect calorimetry to determine resting energy expenditure in a fasting state.  This will allow us  to create a reduced calorie, high-protein meal plan to promote loss of fat mass while preserving muscle mass. Counseled on the health benefits of losing 5%-15% of total body weight. Was counseled on nutritional approaches to weight loss and benefits of reducing processed foods and consuming plant-based foods and high quality protein as part of nutritional weight management. Was counseled on pharmacotherapy and role as an adjunct in weight management.   Obesity Education Performed Today:  She was weighed on the bioimpedance scale and results were discussed and documented in the synopsis.  We discussed obesity as a disease and the importance of a more detailed evaluation of all the factors contributing to the disease.  We discussed the importance of long term lifestyle changes which include nutrition, exercise and behavioral modifications as well as the importance of customizing this to her specific health and social needs.  We discussed the benefits of reaching a healthier weight to alleviate the symptoms of existing conditions and reduce the risks of the biomechanical, metabolic and psychological effects of obesity.  Latasha [name] appears to be in the action stage of change and states they are ready to start intensive lifestyle modifications and behavioral  modifications.  I have spent 40 minutes in the care of the patient today including: preparing to see patient (e.g. review and interpretation of tests, old notes ), obtaining and/or reviewing separately obtained history, performing a medically appropriate examination or evaluation, counseling and educating the patient, documenting clinical information in the electronic or other health care record, and independently interpreting results and communicating results to the patient, family, or caregiver   Reviewed by clinician on day of visit: allergies, medications, problem list, medical history, surgical history, family history, social history, and previous encounter notes pertinent to obesity diagnosis.  Kahleel Fadeley d. Tallan Sandoz, NP-C

## 2024-01-01 ENCOUNTER — Ambulatory Visit (HOSPITAL_BASED_OUTPATIENT_CLINIC_OR_DEPARTMENT_OTHER): Payer: Self-pay | Admitting: Physical Therapy

## 2024-01-02 ENCOUNTER — Ambulatory Visit (HOSPITAL_BASED_OUTPATIENT_CLINIC_OR_DEPARTMENT_OTHER): Attending: Student | Admitting: Physical Therapy

## 2024-01-02 ENCOUNTER — Encounter (HOSPITAL_BASED_OUTPATIENT_CLINIC_OR_DEPARTMENT_OTHER): Payer: Self-pay | Admitting: Physical Therapy

## 2024-01-02 DIAGNOSIS — R269 Unspecified abnormalities of gait and mobility: Secondary | ICD-10-CM | POA: Insufficient documentation

## 2024-01-02 DIAGNOSIS — M6281 Muscle weakness (generalized): Secondary | ICD-10-CM | POA: Diagnosis present

## 2024-01-02 DIAGNOSIS — G8929 Other chronic pain: Secondary | ICD-10-CM | POA: Insufficient documentation

## 2024-01-02 DIAGNOSIS — M25561 Pain in right knee: Secondary | ICD-10-CM | POA: Diagnosis present

## 2024-01-02 DIAGNOSIS — R293 Abnormal posture: Secondary | ICD-10-CM | POA: Diagnosis present

## 2024-01-02 DIAGNOSIS — M25562 Pain in left knee: Secondary | ICD-10-CM | POA: Diagnosis present

## 2024-01-02 NOTE — Therapy (Signed)
 OUTPATIENT PHYSICAL THERAPY LOWER EXTREMITY TREATMENT   Patient Name: Latasha [name] MRN: [id number] DOB:22-Jun-1961, 63 y.o., female Today's Date: 01/02/2024  END OF SESSION:  PT End of Session - 01/02/24 1615     Visit Number 2    Number of Visits 12    Date for PT Re-Evaluation 02/27/24    PT Start Time 1600    PT Stop Time 1653    PT Time Calculation (min) 53 min    Activity Tolerance Patient tolerated treatment well    Behavior During Therapy WFL for tasks assessed/performed             Past Medical History:  Diagnosis Date   Anxiety    Arthritis    Chronic fatigue    Endometriosis    Family history of osteoporosis    GERD (gastroesophageal reflux disease)    Heart murmur    History of kidney stones    Hypertension    IBS (irritable bowel syndrome)    Obesity    Surgical menopause    Vaginal dryness    Past Surgical History:  Procedure Laterality Date   ABDOMINAL HYSTERECTOMY     tah/bso   TONSILLECTOMY  1976   TOTAL HIP ARTHROPLASTY Right 10/03/2021   Procedure: TOTAL HIP ARTHROPLASTY;  Surgeon: Arlyne Lame, MD;  Location: [facility] ORS;  Service: Orthopedics;  Laterality: Right;   Patient Active Problem List   Diagnosis Date Noted   Hx of total hip arthroplasty, right 10/03/2021   De Quervain's tenosynovitis, right 09/06/2020   BP (high blood pressure) 09/05/2015   Osteoporosis, post-menopausal 09/05/2015   Postsurgical menopause 09/05/2015   Class 1 obesity 09/05/2015   GERD (gastroesophageal reflux disease) 09/05/2015   Endometriosis 09/05/2015   Status post total hysterectomy and bilateral salpingo-oophorectomy 09/05/2015   Primary osteoarthritis of both knees 09/28/2014    PCP: [name] MD/  [name], PA-C    REFERRING PROVIDER: [name] MD  REFERRING DIAG: M17.0 (ICD-10-CM) - Bilateral primary osteoarthritis of knee   THERAPY DIAG:  Chronic pain of both knees  Gait abnormality  Muscle weakness  (generalized)  Abnormal posture  Rationale for Evaluation and Treatment: Rehabilitation  ONSET DATE: 10 yrs +  SUBJECTIVE:   SUBJECTIVE STATEMENT: 01/02/24 Pt reports her R knee is not bothering her at the moment, but she is tired from a lot of walking over the last 2 days. Pt does know how to swim but it has been a long time since she was in a pool.   Initial subjective: Planned TKR June 30 by Dr [name].  I [facility] up [employer] to get pool access.  Knee pain has begun "playing with my mobility".  I was a walker but am unable to go now.  Also have OA left shoulder may need total shoulder.  Had injection 1 week ago in shoulder has reduced pain.  Not going to do anything now focusing on knee.  PERTINENT HISTORY: Bilateral shoulder pain. 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 no: NPRS scale: current 0/10 Pain location: left knee Pain description: ache Aggravating factors: walking, weight baring Relieving factors: rest  PRECAUTIONS: None  RED FLAGS: None   WEIGHT BEARING RESTRICTIONS: No  FALLS:  Has patient fallen in last 6 months? No  LIVING ENVIRONMENT: Lives with: lives with their family Lives in: House/apartment Stairs:  5 steps to exit home handrail Has following equipment at home: Single point cane  OCCUPATION: retired  PLOF: Requires  assistive device for independence  PATIENT GOALS: get back to walking, dance at sons wedding in [place],  [place] MD VISIT:  2 weeks  OBJECTIVE:  Note: Objective measures were completed at Evaluation unless otherwise noted.  DIAGNOSTIC FINDINGS: OA bilat knee  PATIENT SURVEYS:  LEFS 33/80  COGNITION: Overall cognitive status: Within functional limits for tasks assessed     SENSATION: WFL  EDEMA:  Slight pitted edema +1 left ankle, slight diffuse edema throughout left knee   MUSCLE LENGTH: Hamstrings: wfl   POSTURE: rounded shoulders, weight shift right, and bilat valgus deformities (left  ~10d)  PALPATION: TTP about medial left knee joint line  LOWER EXTREMITY ROM:   10 degree left valgus deformity Active ROM Right eval Left eval  Hip flexion    Hip extension    Hip abduction    Hip adduction    Hip internal rotation    Hip external rotation    Knee flexion 113 106  Knee extension 0 0  Ankle dorsiflexion    Ankle plantarflexion    Ankle inversion    Ankle eversion     (Blank rows = not tested)  LOWER EXTREMITY MMT:  HD lbs Right eval Left eval  Hip flexion 45.0 38.8  Hip extension    Hip abduction 32.5 34.1  Hip adduction    Hip internal rotation    Hip external rotation    Knee flexion    Knee extension 34.4 34.1  Ankle dorsiflexion    Ankle plantarflexion    Ankle inversion    Ankle eversion     (Blank rows = not tested)   FUNCTIONAL TESTS:  Timed up and go (TUG): 22.17 4 stage balance: passed 1&2; tandem x 8s; SLS x 2s  GAIT: Distance walked: 400 ft Assistive device utilized: Single point cane Level of assistance: Complete Independence Comments: Bilat knee valgus deformities; slight hip circumduction to allow for advancement of LE; knees knocking, increased lateral displacement                                                                                                                                TREATMENT [facility] Adult PT Treatment:                                                DATE: 01/02/24 Pt seen for aquatic therapy today.  Treatment took place in water 3.5-4.75 ft in depth at the [facility] pool. Temp of water was 91.  Pt entered/exited the pool via stairs in step-to pattern with bilat rail.  - Intro to aquatic therapy principles - unsupported walking forward/ backward, cues for upright trunk and even step length - unsupported side stepping, with varying step height - marching forward/ backward - forward walking kicks - UE on wall:  toe/heel raises x 10;  hip add/abd 2 x 10  ; hip flexion /extension x 10 RLE-> moved  to holding hand floats on surface x 10 each, cues for core engagement - STS on bench in water with cues for forward arm reach and controlled descent x 10 - STS on 3rd step x 3 (challenge) - straddling noodle with UE support on wall: cycling  - self care: discussed ice/heat application parameters  - discussed what to expect after session and post TKA.   Pt requires the buoyancy and hydrostatic pressure of water for support, and to offload joints by unweighting joint load by at least 50 % in navel deep water and by at least 75-80% in chest to neck deep water.  Viscosity of the water is needed for resistance of strengthening. Water current perturbations provides challenge to standing balance requiring increased core activation.     PATIENT EDUCATION:  Education details: Intro to aquatic therapy Person educated: Patient Education method: Explanation Education comprehension: verbalized understanding  HOME EXERCISE PROGRAM: TBA  ASSESSMENT:  CLINICAL IMPRESSION:  Pt demonstrates safety and independence in aquatic setting with therapist instructing from deck. Pt demonstrates confidence in setting, moving throughout all depths easily.  Pt is directed through various movement patterns and trials in both sitting and standing positions. Pt is observed using LLE to ascend and descend steps in sideways manner; will need to strengthen RLE in preparation for L knee surgery. She has difficulty holding the wall with L hand due to limited shoulder ROM and strength; did better with holding hand floats on surface for support. Only minor pain in L buttocks on initial STS from 3rd step; resolved with rest and correction on form.   Goals are ongoing.    Initial impression:  Patient is a 63 y.o. f who was seen today for physical therapy evaluation and treatment for Bilateral primary osteoarthritis of knee . She presents today with gross bilateral knee valgus deformities impairing gait using a SPC. She is  preparing for left TKR at the end of June and will likely follow with right before end of year.  She is a high fall risk due to balance deficits, she does have some muscle weakness and demonstrates difficulty with transfers due to progressed OA.  Her goals are to improve her toleration to activity and eventually return to walking up toward 2 miles.  She is a good candidate for aquatic therapy intervention to improve strength, maintain/progress bilat knee ROM, improve balance and safety with amb and prepare for upcoming surgical procedure. Will plan to conider land based interventionas therapy progresses.  OBJECTIVE IMPAIRMENTS: Abnormal gait, decreased balance, decreased endurance, decreased mobility, difficulty walking, decreased ROM, decreased strength, obesity, and pain.   ACTIVITY LIMITATIONS: carrying, lifting, bending, sitting, standing, squatting, sleeping, stairs, transfers, and locomotion level  PARTICIPATION LIMITATIONS: meal prep, cleaning, laundry, shopping, and community activity  PERSONAL FACTORS:  s ee PmHx are also affecting patient's functional outcome.   REHAB POTENTIAL: Good  CLINICAL DECISION MAKING: Stable/uncomplicated  EVALUATION COMPLEXITY: Low   GOALS: Goals reviewed with patient? Yes  SHORT TERM GOALS: Target date: 01/20/24 Pt will tolerate full aquatic sessions consistently without increase in pain and with improving function to demonstrate good toleration and effectiveness of intervention.  Baseline: Goal status: INITIAL  2.  Pt will have initial aquatic HEP and will complete indep at personal pool access Baseline:  Goal status: INITIAL  3.  Pt will report 0/10 pain in knees with submerged activity Baseline:  Goal status: INITIAL  4.  Pt will tolerate 10+ minutes of continuous amb in upright position submerged to demonstrate improved toleration to amb Baseline: leaning forward on grocery cart. Goal status: INITIAL    LONG TERM GOALS: Target date:  02/27/24  Pt to improve on LEFS by at least 9 point to demonstrate statistically significant Improvement in function. Baseline:33/80  Goal status: INITIAL  2.  Pt will improve on Tug test to <or= 15s to demonstrate improvement in lower extremity function, mobility and decreased fall risk. Baseline:22.17  Goal status: INITIAL  3.  Pt will perform SLS in 3.6 ft unsupported x 20s to demonstrate improved balance ability Baseline:  Goal status: INITIAL  4.  Pt will be indep with final HEP's (land and aquatic as appropriate) for continued management of condition Baseline:  Goal status: INITIAL  5.  Pt will report decrease in pain by at least 50% for improved toleration to activity/quality of life and to demonstrate improved management of pain. Baseline:  Goal status: INITIAL     PLAN:  PT FREQUENCY: 1-2x/week  PT DURATION: 8 weeks (extended due to scheduling conflicts)  PLANNED INTERVENTIONS: 29562- PT Re-evaluation, 97110-Therapeutic exercises, 97530- Therapeutic activity, 97112- Neuromuscular re-education, 97535- Self Care, 13086- Manual therapy, [phone]- Gait training, [phone]- Aquatic Therapy, [phone]- Electrical stimulation (unattended), [phone]- Ionotophoresis 4mg /ml Dexamethasone , Patient/Family education, Balance training, Stair training, Taping, Dry Needling, Joint mobilization, DME instructions, Cryotherapy, and Moist heat  PLAN FOR NEXT SESSION: aquatics: le stretching and strengthening, balance retraining, proprioceptive retraining, pain management, gait tolerance  Almedia Jacobsen, PTA 01/02/24 5:07 PM [facility] [address] [place], [place], [address] Phone: [phone]   Fax:  [phone]    Referring diagnosis? M17.0 (ICD-10-CM) - Bilateral primary osteoarthritis of knee  Treatment diagnosis? (if different than referring diagnosis) no What was this (referring dx) caused by? []  Surgery []  Fall []  Ongoing  issue [x]  Arthritis []  Other: ____________  Laterality: []  Rt []  Lt [x]  Both  Check all possible CPT codes:  *CHOOSE 10 OR LESS*    See Planned Interventions listed in the Plan section of the Evaluation.

## 2024-01-05 ENCOUNTER — Ambulatory Visit (HOSPITAL_BASED_OUTPATIENT_CLINIC_OR_DEPARTMENT_OTHER): Payer: Self-pay | Admitting: Physical Therapy

## 2024-01-05 ENCOUNTER — Encounter (HOSPITAL_BASED_OUTPATIENT_CLINIC_OR_DEPARTMENT_OTHER): Payer: Self-pay | Admitting: Physical Therapy

## 2024-01-05 DIAGNOSIS — M6281 Muscle weakness (generalized): Secondary | ICD-10-CM

## 2024-01-05 DIAGNOSIS — R269 Unspecified abnormalities of gait and mobility: Secondary | ICD-10-CM

## 2024-01-05 DIAGNOSIS — G8929 Other chronic pain: Secondary | ICD-10-CM

## 2024-01-05 DIAGNOSIS — R293 Abnormal posture: Secondary | ICD-10-CM

## 2024-01-05 DIAGNOSIS — M25561 Pain in right knee: Secondary | ICD-10-CM | POA: Diagnosis not present

## 2024-01-05 NOTE — Therapy (Signed)
 OUTPATIENT PHYSICAL THERAPY LOWER EXTREMITY TREATMENT   Patient Name: Latasha [name] MRN: [id number] DOB:10/10/1960, 63 y.o., female Today's Date: 01/05/2024  END OF SESSION:  PT End of Session - 01/05/24 1149     Visit Number 3    Number of Visits 12    Date for PT Re-Evaluation 02/27/24    PT Start Time 1103    PT Stop Time 1142    PT Time Calculation (min) 39 min    Activity Tolerance Patient tolerated treatment well    Behavior During Therapy WFL for tasks assessed/performed              Past Medical History:  Diagnosis Date   Anxiety    Arthritis    Chronic fatigue    Endometriosis    Family history of osteoporosis    GERD (gastroesophageal reflux disease)    Heart murmur    History of kidney stones    Hypertension    IBS (irritable bowel syndrome)    Obesity    Surgical menopause    Vaginal dryness    Past Surgical History:  Procedure Laterality Date   ABDOMINAL HYSTERECTOMY     tah/bso   TONSILLECTOMY  1976   TOTAL HIP ARTHROPLASTY Right 10/03/2021   Procedure: TOTAL HIP ARTHROPLASTY;  Surgeon: Arlyne Lame, MD;  Location: [facility] ORS;  Service: Orthopedics;  Laterality: Right;   Patient Active Problem List   Diagnosis Date Noted   Hx of total hip arthroplasty, right 10/03/2021   De Quervain's tenosynovitis, right 09/06/2020   BP (high blood pressure) 09/05/2015   Osteoporosis, post-menopausal 09/05/2015   Postsurgical menopause 09/05/2015   Class 1 obesity 09/05/2015   GERD (gastroesophageal reflux disease) 09/05/2015   Endometriosis 09/05/2015   Status post total hysterectomy and bilateral salpingo-oophorectomy 09/05/2015   Primary osteoarthritis of both knees 09/28/2014    PCP: [name] MD/  [name], PA-C    REFERRING PROVIDER: [name] MD  REFERRING DIAG: M17.0 (ICD-10-CM) - Bilateral primary osteoarthritis of knee   THERAPY DIAG:  Chronic pain of both knees  Gait abnormality  Muscle weakness  (generalized)  Abnormal posture  Rationale for Evaluation and Treatment: Rehabilitation  ONSET DATE: 10 yrs +  SUBJECTIVE:   SUBJECTIVE STATEMENT: 01/05/24 No pain for almost 2 day after 1st session  Initial subjective: Planned TKR June 30 by Dr [name].  I [facility] up [employer] to get pool access.  Knee pain has begun "playing with my mobility".  I was a walker but am unable to go now.  Also have OA left shoulder may need total shoulder.  Had injection 1 week ago in shoulder has reduced pain.  Not going to do anything now focusing on knee.  PERTINENT HISTORY: Bilateral shoulder pain. 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 no: NPRS scale: current 0/10 Pain location: left knee Pain description: ache Aggravating factors: walking, weight baring Relieving factors: rest  PRECAUTIONS: None  RED FLAGS: None   WEIGHT BEARING RESTRICTIONS: No  FALLS:  Has patient fallen in last 6 months? No  LIVING ENVIRONMENT: Lives with: lives with their family Lives in: House/apartment Stairs:  5 steps to exit home handrail Has following equipment at home: Single point cane  OCCUPATION: retired  PLOF: Requires assistive device for independence  PATIENT GOALS: get back to walking, dance at sons wedding in [place],  [place] MD VISIT:  2 weeks  OBJECTIVE:  Note: Objective measures were completed at Evaluation unless otherwise  noted.  DIAGNOSTIC FINDINGS: OA bilat knee  PATIENT SURVEYS:  LEFS 33/80  COGNITION: Overall cognitive status: Within functional limits for tasks assessed     SENSATION: WFL  EDEMA:  Slight pitted edema +1 left ankle, slight diffuse edema throughout left knee   MUSCLE LENGTH: Hamstrings: wfl   POSTURE: rounded shoulders, weight shift right, and bilat valgus deformities (left ~10d)  PALPATION: TTP about medial left knee joint line  LOWER EXTREMITY ROM:   10 degree left valgus deformity Active ROM Right eval Left eval   Hip flexion    Hip extension    Hip abduction    Hip adduction    Hip internal rotation    Hip external rotation    Knee flexion 113 106  Knee extension 0 0  Ankle dorsiflexion    Ankle plantarflexion    Ankle inversion    Ankle eversion     (Blank rows = not tested)  LOWER EXTREMITY MMT:  HD lbs Right eval Left eval  Hip flexion 45.0 38.8  Hip extension    Hip abduction 32.5 34.1  Hip adduction    Hip internal rotation    Hip external rotation    Knee flexion    Knee extension 34.4 34.1  Ankle dorsiflexion    Ankle plantarflexion    Ankle inversion    Ankle eversion     (Blank rows = not tested)   FUNCTIONAL TESTS:  Timed up and go (TUG): 22.17 4 stage balance: passed 1&2; tandem x 8s; SLS x 2s  GAIT: Distance walked: 400 ft Assistive device utilized: Single point cane Level of assistance: Complete Independence Comments: Bilat knee valgus deformities; slight hip circumduction to allow for advancement of LE; knees knocking, increased lateral displacement                                                                                                                                TREATMENT [facility] Adult PT Treatment:                                                DATE: 01/05/24 Pt seen for aquatic therapy today.  Treatment took place in water 3.5-4.75 ft in depth at the [facility] pool. Temp of water was 91.  Pt entered/exited the pool via stairs in step-to pattern with bilat rail.  -stair negotiation - unsupported walking/marching forward/ backward, cues for upright trunk and even step length - unsupported side stepping, with varying step height - forward walking kicks -Step up leading R/L initial ue support cues for execution decreasing ue pull -Stair tapping bottom step then 2nd step x 10 decreasing ue support to unsupported - UE on wall:  toe/heel raises x 10; hip add/abd 2 x 10  ; hip flexion /extension x 10 RLE-> moved to holding hand  floats on  surface x 10 each, cues for core engagement - STS from bench onto bottom step x 10.  Cues for slowed descent -hamstring stretch (difficulty holding to hand rail due to shoulder dysfunction); figure four stretch (difficulty gaining position) - straddling noodle with UE support on RBHB: cycling   - holding to wall: hip add/abd; hip flex/ext   Pt requires the buoyancy and hydrostatic pressure of water for support, and to offload joints by unweighting joint load by at least 50 % in navel deep water and by at least 75-80% in chest to neck deep water.  Viscosity of the water is needed for resistance of strengthening. Water current perturbations provides challenge to standing balance requiring increased core activation.     PATIENT EDUCATION:  Education details: Intro to aquatic therapy Person educated: Patient Education method: Explanation Education comprehension: verbalized understanding  HOME EXERCISE PROGRAM: TBA  ASSESSMENT:  CLINICAL IMPRESSION:  Focus on le stretching and strengthening. Some difficulty stretching hamstrings due to valgus deformities and tightness of hamstrings. Cueing improves stair negotiation step using LE strength vs ue pull. Goals ongoing    Initial impression:  Patient is a 63 y.o. f who was seen today for physical therapy evaluation and treatment for Bilateral primary osteoarthritis of knee . She presents today with gross bilateral knee valgus deformities impairing gait using a SPC. She is preparing for left TKR at the end of June and will likely follow with right before end of year.  She is a high fall risk due to balance deficits, she does have some muscle weakness and demonstrates difficulty with transfers due to progressed OA.  Her goals are to improve her toleration to activity and eventually return to walking up toward 2 miles.  She is a good candidate for aquatic therapy intervention to improve strength, maintain/progress bilat knee ROM, improve balance and  safety with amb and prepare for upcoming surgical procedure. Will plan to conider land based interventionas therapy progresses.  OBJECTIVE IMPAIRMENTS: Abnormal gait, decreased balance, decreased endurance, decreased mobility, difficulty walking, decreased ROM, decreased strength, obesity, and pain.   ACTIVITY LIMITATIONS: carrying, lifting, bending, sitting, standing, squatting, sleeping, stairs, transfers, and locomotion level  PARTICIPATION LIMITATIONS: meal prep, cleaning, laundry, shopping, and community activity  PERSONAL FACTORS:  s ee PmHx are also affecting patient's functional outcome.   REHAB POTENTIAL: Good  CLINICAL DECISION MAKING: Stable/uncomplicated  EVALUATION COMPLEXITY: Low   GOALS: Goals reviewed with patient? Yes  SHORT TERM GOALS: Target date: 01/20/24 Pt will tolerate full aquatic sessions consistently without increase in pain and with improving function to demonstrate good toleration and effectiveness of intervention.  Baseline: Goal status: INITIAL  2.  Pt will have initial aquatic HEP and will complete indep at personal pool access Baseline:  Goal status: INITIAL  3.  Pt will report 0/10 pain in knees with submerged activity Baseline:  Goal status: INITIAL  4.  Pt will tolerate 10+ minutes of continuous amb in upright position submerged to demonstrate improved toleration to amb Baseline: leaning forward on grocery cart. Goal status: INITIAL    LONG TERM GOALS: Target date: 02/27/24  Pt to improve on LEFS by at least 9 point to demonstrate statistically significant Improvement in function. Baseline:33/80  Goal status: INITIAL  2.  Pt will improve on Tug test to <or= 15s to demonstrate improvement in lower extremity function, mobility and decreased fall risk. Baseline:22.17  Goal status: INITIAL  3.  Pt will perform SLS in 3.6 ft unsupported x 20s to  demonstrate improved balance ability Baseline:  Goal status: INITIAL  4.  Pt will be indep  with final HEP's (land and aquatic as appropriate) for continued management of condition Baseline:  Goal status: INITIAL  5.  Pt will report decrease in pain by at least 50% for improved toleration to activity/quality of life and to demonstrate improved management of pain. Baseline:  Goal status: INITIAL     PLAN:  PT FREQUENCY: 1-2x/week  PT DURATION: 8 weeks (extended due to scheduling conflicts)  PLANNED INTERVENTIONS: 40981- PT Re-evaluation, 97110-Therapeutic exercises, 97530- Therapeutic activity, 97112- Neuromuscular re-education, 97535- Self Care, 19147- Manual therapy, [phone]- Gait training, [phone]- Aquatic Therapy, [phone]- Electrical stimulation (unattended), [phone]- Ionotophoresis 4mg /ml Dexamethasone , Patient/Family education, Balance training, Stair training, Taping, Dry Needling, Joint mobilization, DME instructions, Cryotherapy, and Moist heat  PLAN FOR NEXT SESSION: aquatics: le stretching and strengthening, balance retraining, proprioceptive retraining, pain management, gait tolerance  Lucinda Saber) Hebert Dooling MPT 01/05/24 11:51 AM [facility] [address] [place], [place], [address] Phone: [phone]   Fax:  [phone]     Referring diagnosis? M17.0 (ICD-10-CM) - Bilateral primary osteoarthritis of knee  Treatment diagnosis? (if different than referring diagnosis) no What was this (referring dx) caused by? []  Surgery []  Fall []  Ongoing issue [x]  Arthritis []  Other: ____________  Laterality: []  Rt []  Lt [x]  Both  Check all possible CPT codes:  *CHOOSE 10 OR LESS*    See Planned Interventions listed in the Plan section of the Evaluation.

## 2024-01-07 ENCOUNTER — Encounter (HOSPITAL_BASED_OUTPATIENT_CLINIC_OR_DEPARTMENT_OTHER): Payer: Self-pay | Admitting: Physical Therapy

## 2024-01-07 ENCOUNTER — Ambulatory Visit (HOSPITAL_BASED_OUTPATIENT_CLINIC_OR_DEPARTMENT_OTHER): Admitting: Physical Therapy

## 2024-01-07 DIAGNOSIS — R293 Abnormal posture: Secondary | ICD-10-CM

## 2024-01-07 DIAGNOSIS — M6281 Muscle weakness (generalized): Secondary | ICD-10-CM

## 2024-01-07 DIAGNOSIS — R269 Unspecified abnormalities of gait and mobility: Secondary | ICD-10-CM

## 2024-01-07 DIAGNOSIS — M25561 Pain in right knee: Secondary | ICD-10-CM | POA: Diagnosis not present

## 2024-01-07 DIAGNOSIS — G8929 Other chronic pain: Secondary | ICD-10-CM

## 2024-01-07 NOTE — Therapy (Signed)
 OUTPATIENT PHYSICAL THERAPY LOWER EXTREMITY TREATMENT   Patient Name: Latasha [name] MRN: [id number] DOB:[birth year], 63 y.o., female Today's Date: 01/07/2024  END OF SESSION:  PT End of Session - 01/07/24 1254     Visit Number 4    Number of Visits 12    Date for PT Re-Evaluation 02/27/24    PT Start Time 0930    PT Stop Time 1008    PT Time Calculation (min) 38 min    Activity Tolerance Patient tolerated treatment well    Behavior During Therapy WFL for tasks assessed/performed               Past Medical History:  Diagnosis Date   Anxiety    Arthritis    Chronic fatigue    Endometriosis    Family history of osteoporosis    GERD (gastroesophageal reflux disease)    Heart murmur    History of kidney stones    Hypertension    IBS (irritable bowel syndrome)    Obesity    Surgical menopause    Vaginal dryness    Past Surgical History:  Procedure Laterality Date   ABDOMINAL HYSTERECTOMY     tah/bso   TONSILLECTOMY  1976   TOTAL HIP ARTHROPLASTY Right 10/03/2021   Procedure: TOTAL HIP ARTHROPLASTY;  Surgeon: Arlyne Lame, MD;  Location: [facility] ORS;  Service: Orthopedics;  Laterality: Right;   Patient Active Problem List   Diagnosis Date Noted   Hx of total hip arthroplasty, right 10/03/2021   De Quervain's tenosynovitis, right 09/06/2020   BP (high blood pressure) 09/05/2015   Osteoporosis, post-menopausal 09/05/2015   Postsurgical menopause 09/05/2015   Class 1 obesity 09/05/2015   GERD (gastroesophageal reflux disease) 09/05/2015   Endometriosis 09/05/2015   Status post total hysterectomy and bilateral salpingo-oophorectomy 09/05/2015   Primary osteoarthritis of both knees 09/28/2014    PCP: [name] MD/  [name], PA-C    REFERRING PROVIDER: [name] MD  REFERRING DIAG: M17.0 (ICD-10-CM) - Bilateral primary osteoarthritis of knee   THERAPY DIAG:  Chronic pain of both knees  Gait abnormality  Muscle weakness  (generalized)  Abnormal posture  Rationale for Evaluation and Treatment: Rehabilitation  ONSET DATE: 10 yrs +  SUBJECTIVE:   SUBJECTIVE STATEMENT: 01/07/24 Pt reports she had increase in sciatica in PM of last session; has resolved now.  Pt reports she has no pain when exercising in water, "I wish I could come more often.  I feel good in the water.".   Initial subjective: Planned TKR June 30 by Dr [name].  I signed up [employer] to get pool access.  Knee pain has begun "playing with my mobility".  I was a walker but am unable to go now.  Also have OA left shoulder may need total shoulder.  Had injection 1 week ago in shoulder has reduced pain.  Not going to do anything now focusing on knee.  PERTINENT HISTORY: Bilateral shoulder pain. 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 yes: NPRS scale: current 2/10 Pain location: left knee Pain description: ache Aggravating factors: walking, weight baring Relieving factors: rest  PRECAUTIONS: None  RED FLAGS: None   WEIGHT BEARING RESTRICTIONS: No  FALLS:  Has patient fallen in last 6 months? No  LIVING ENVIRONMENT: Lives with: lives with their family Lives in: House/apartment Stairs:  5 steps to exit home handrail Has following equipment at home: Single point cane  OCCUPATION: retired  PLOF: Requires assistive device for  independence  PATIENT GOALS: get back to walking, dance at sons wedding in [place],  [place] MD VISIT:  2 weeks  OBJECTIVE:  Note: Objective measures were completed at Evaluation unless otherwise noted.  DIAGNOSTIC FINDINGS: OA bilat knee  PATIENT SURVEYS:  LEFS 33/80  COGNITION: Overall cognitive status: Within functional limits for tasks assessed     SENSATION: WFL  EDEMA:  Slight pitted edema +1 left ankle, slight diffuse edema throughout left knee   MUSCLE LENGTH: Hamstrings: wfl   POSTURE: rounded shoulders, weight shift right, and bilat valgus deformities  (left ~10d)  PALPATION: TTP about medial left knee joint line  LOWER EXTREMITY ROM:   10 degree left valgus deformity Active ROM Right eval Left eval  Hip flexion    Hip extension    Hip abduction    Hip adduction    Hip internal rotation    Hip external rotation    Knee flexion 113 106  Knee extension 0 0  Ankle dorsiflexion    Ankle plantarflexion    Ankle inversion    Ankle eversion     (Blank rows = not tested)  LOWER EXTREMITY MMT:  HD lbs Right eval Left eval  Hip flexion 45.0 38.8  Hip extension    Hip abduction 32.5 34.1  Hip adduction    Hip internal rotation    Hip external rotation    Knee flexion    Knee extension 34.4 34.1  Ankle dorsiflexion    Ankle plantarflexion    Ankle inversion    Ankle eversion     (Blank rows = not tested)   FUNCTIONAL TESTS:  Timed up and go (TUG): 22.17 4 stage balance: passed 1&2; tandem x 8s; SLS x 2s  GAIT: Distance walked: 400 ft Assistive device utilized: Single point cane Level of assistance: Complete Independence Comments: Bilat knee valgus deformities; slight hip circumduction to allow for advancement of LE; knees knocking, increased lateral displacement                                                                                                                                TREATMENT [facility] Adult PT Treatment:                                                DATE: 01/07/24 Pt seen for aquatic therapy today.  Treatment took place in water 3.5-4.75 ft in depth at the [facility] pool. Temp of water was 91.  Pt entered/exited the pool via stairs in step-to pattern with bilat rail.  - unsupported walking forward/ backward, cues for even step length - unsupported side stepping, with arm addct/ abdct -> with rainbow hand floats  -suitcase carry -R forward step up x 10 - UE on wall:  toe/heel raises x 10; hip add/abd 2 x 10  ;  hip flexion /extension x 10 RLE-> moved to holding hand floats on surface x  10 each, cues for core engagement - STS from bench blue step x 10.  Cues for slowed descent - straddling noodle with UE support on wall: cycling, hip abdct /addct and cross country ski -return to walking    Pt requires the buoyancy and hydrostatic pressure of water for support, and to offload joints by unweighting joint load by at least 50 % in navel deep water and by at least 75-80% in chest to neck deep water.  Viscosity of the water is needed for resistance of strengthening. Water current perturbations provides challenge to standing balance requiring increased core activation.     PATIENT EDUCATION:  Education details: Intro to aquatic therapy Person educated: Patient Education method: Explanation Education comprehension: verbalized understanding  HOME EXERCISE PROGRAM: TBA  ASSESSMENT:  CLINICAL IMPRESSION:  Improved R forward step up with cues for forward trunk and glute activation. Good tolerance for all exercises with reduction of pain. Need to remain mindful of L shoulder and limited ROM when pt using LUE for support on wall or float. Pt has met STG 1 and 3.      Initial impression:  Patient is a 63 y.o. f who was seen today for physical therapy evaluation and treatment for Bilateral primary osteoarthritis of knee . She presents today with gross bilateral knee valgus deformities impairing gait using a SPC. She is preparing for left TKR at the end of June and will likely follow with right before end of year.  She is a high fall risk due to balance deficits, she does have some muscle weakness and demonstrates difficulty with transfers due to progressed OA.  Her goals are to improve her toleration to activity and eventually return to walking up toward 2 miles.  She is a good candidate for aquatic therapy intervention to improve strength, maintain/progress bilat knee ROM, improve balance and safety with amb and prepare for upcoming surgical procedure. Will plan to conider land based  interventionas therapy progresses.  OBJECTIVE IMPAIRMENTS: Abnormal gait, decreased balance, decreased endurance, decreased mobility, difficulty walking, decreased ROM, decreased strength, obesity, and pain.   ACTIVITY LIMITATIONS: carrying, lifting, bending, sitting, standing, squatting, sleeping, stairs, transfers, and locomotion level  PARTICIPATION LIMITATIONS: meal prep, cleaning, laundry, shopping, and community activity  PERSONAL FACTORS:  s ee PmHx are also affecting patient's functional outcome.   REHAB POTENTIAL: Good  CLINICAL DECISION MAKING: Stable/uncomplicated  EVALUATION COMPLEXITY: Low   GOALS: Goals reviewed with patient? Yes  SHORT TERM GOALS: Target date: 01/20/24 Pt will tolerate full aquatic sessions consistently without increase in pain and with improving function to demonstrate good toleration and effectiveness of intervention.  Baseline: Goal status: MET - 01/07/24  2.  Pt will have initial aquatic HEP and will complete indep at personal pool access Baseline:  Goal status: INITIAL  3.  Pt will report 0/10 pain in knees with submerged activity Baseline:  Goal status: MET - 01/07/24  4.  Pt will tolerate 10+ minutes of continuous amb in upright position submerged to demonstrate improved toleration to amb Baseline: leaning forward on grocery cart. Goal status: INITIAL    LONG TERM GOALS: Target date: 02/27/24  Pt to improve on LEFS by at least 9 point to demonstrate statistically significant Improvement in function. Baseline:33/80  Goal status: INITIAL  2.  Pt will improve on Tug test to <or= 15s to demonstrate improvement in lower extremity function, mobility and decreased fall risk. Baseline:22.17  Goal status: INITIAL  3.  Pt will perform SLS in 3.6 ft unsupported x 20s to demonstrate improved balance ability Baseline:  Goal status: INITIAL  4.  Pt will be indep with final HEP's (land and aquatic as appropriate) for continued management of  condition Baseline:  Goal status: INITIAL  5.  Pt will report decrease in pain by at least 50% for improved toleration to activity/quality of life and to demonstrate improved management of pain. Baseline:  Goal status: INITIAL     PLAN:  PT FREQUENCY: 1-2x/week  PT DURATION: 8 weeks (extended due to scheduling conflicts)  PLANNED INTERVENTIONS: 16109- PT Re-evaluation, 97110-Therapeutic exercises, 97530- Therapeutic activity, 97112- Neuromuscular re-education, 97535- Self Care, 60454- Manual therapy, [phone]- Gait training, [phone]- Aquatic Therapy, [phone]- Electrical stimulation (unattended), [phone]- Ionotophoresis 4mg /ml Dexamethasone , Patient/Family education, Balance training, Stair training, Taping, Dry Needling, Joint mobilization, DME instructions, Cryotherapy, and Moist heat  PLAN FOR NEXT SESSION: aquatics: LE stretching and strengthening, balance retraining, proprioceptive retraining, pain management, gait tolerance  Almedia Jacobsen, PTA 01/07/24 12:59 PM [facility] [address] [place], [place], [address] Phone: [phone]   Fax:  [phone]      Referring diagnosis? M17.0 (ICD-10-CM) - Bilateral primary osteoarthritis of knee  Treatment diagnosis? (if different than referring diagnosis) no What was this (referring dx) caused by? []  Surgery []  Fall []  Ongoing issue [x]  Arthritis []  Other: ____________  Laterality: []  Rt []  Lt [x]  Both  Check all possible CPT codes:  *CHOOSE 10 OR LESS*    See Planned Interventions listed in the Plan section of the Evaluation.

## 2024-01-12 ENCOUNTER — Encounter (HOSPITAL_BASED_OUTPATIENT_CLINIC_OR_DEPARTMENT_OTHER): Payer: Self-pay | Admitting: Physical Therapy

## 2024-01-12 ENCOUNTER — Ambulatory Visit (HOSPITAL_BASED_OUTPATIENT_CLINIC_OR_DEPARTMENT_OTHER): Payer: Self-pay | Admitting: Physical Therapy

## 2024-01-12 DIAGNOSIS — G8929 Other chronic pain: Secondary | ICD-10-CM

## 2024-01-12 DIAGNOSIS — R269 Unspecified abnormalities of gait and mobility: Secondary | ICD-10-CM

## 2024-01-12 DIAGNOSIS — M25561 Pain in right knee: Secondary | ICD-10-CM | POA: Diagnosis not present

## 2024-01-12 DIAGNOSIS — M6281 Muscle weakness (generalized): Secondary | ICD-10-CM

## 2024-01-12 DIAGNOSIS — R293 Abnormal posture: Secondary | ICD-10-CM

## 2024-01-12 NOTE — Therapy (Signed)
 OUTPATIENT PHYSICAL THERAPY LOWER EXTREMITY TREATMENT   Patient Name: Latasha [name] MRN: [id number] DOB:02/06/1961, 63 y.o., female Today's Date: 01/12/2024  END OF SESSION:  PT End of Session - 01/12/24 1017     Visit Number 5    Number of Visits 12    Date for PT Re-Evaluation 02/27/24    PT Start Time 1015    PT Stop Time 1055    PT Time Calculation (min) 40 min    Activity Tolerance Patient tolerated treatment well    Behavior During Therapy WFL for tasks assessed/performed               Past Medical History:  Diagnosis Date   Anxiety    Arthritis    Chronic fatigue    Endometriosis    Family history of osteoporosis    GERD (gastroesophageal reflux disease)    Heart murmur    History of kidney stones    Hypertension    IBS (irritable bowel syndrome)    Obesity    Surgical menopause    Vaginal dryness    Past Surgical History:  Procedure Laterality Date   ABDOMINAL HYSTERECTOMY     tah/bso   TONSILLECTOMY  1976   TOTAL HIP ARTHROPLASTY Right 10/03/2021   Procedure: TOTAL HIP ARTHROPLASTY;  Surgeon: Latasha Lame, MD;  Location: [facility] ORS;  Service: Orthopedics;  Laterality: Right;   Patient Active Problem List   Diagnosis Date Noted   Hx of total hip arthroplasty, right 10/03/2021   De Quervain's tenosynovitis, right 09/06/2020   BP (high blood pressure) 09/05/2015   Osteoporosis, post-menopausal 09/05/2015   Postsurgical menopause 09/05/2015   Class 1 obesity 09/05/2015   GERD (gastroesophageal reflux disease) 09/05/2015   Endometriosis 09/05/2015   Status post total hysterectomy and bilateral salpingo-oophorectomy 09/05/2015   Primary osteoarthritis of both knees 09/28/2014    PCP: Latasha Cunning MD/  Latasha Guile, PA-C    REFERRING PROVIDER: [name] MD  REFERRING DIAG: M17.0 (ICD-10-CM) - Bilateral primary osteoarthritis of knee   THERAPY DIAG:  Chronic pain of both knees  Gait abnormality  Muscle weakness  (generalized)  Abnormal posture  Rationale for Evaluation and Treatment: Rehabilitation  ONSET DATE: 10 yrs +  SUBJECTIVE:   SUBJECTIVE STATEMENT: 01/12/24 Pt reports no knee pain just 4-5/10 uncomfortable (grinding/tight).  Sciatic pain more limiting than knees  Initial subjective: Planned TKR June 30 by Dr Latasha [name].  I [facility] [employer] to get [facility] access.  Knee pain has begun "playing with my mobility".  I was a walker but am unable to go now.  Also have OA left shoulder may need total shoulder.  Had injection 1 week ago in shoulder has reduced pain.  Not going to do anything now focusing on knee.  PERTINENT HISTORY: Bilateral shoulder pain. 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 yes: NPRS scale: current 0/10 Pain location: left knee Pain description: ache Aggravating factors: walking, weight baring Relieving factors: rest  PRECAUTIONS: None  RED FLAGS: None   WEIGHT BEARING RESTRICTIONS: No  FALLS:  Has patient fallen in last 6 months? No  LIVING ENVIRONMENT: Lives with: lives with their family Lives in: House/apartment Stairs: 5 steps to exit home handrail Has following equipment at home: Single point cane  OCCUPATION: retired  PLOF: Requires assistive device for independence  PATIENT GOALS: get back to walking, dance at sons wedding in [place],  [place] MD VISIT:  2 weeks  OBJECTIVE:  Note: Objective  measures were completed at Evaluation unless otherwise noted.  DIAGNOSTIC FINDINGS: OA bilat knee  PATIENT SURVEYS:  LEFS 33/80  COGNITION: Overall cognitive status: Within functional limits for tasks assessed     SENSATION: WFL  EDEMA:  Slight pitted edema +1 left ankle, slight diffuse edema throughout left knee   MUSCLE LENGTH: Hamstrings: wfl   POSTURE: rounded shoulders, weight shift right, and bilat valgus deformities (left ~10d)  PALPATION: TTP about medial left knee joint line  LOWER EXTREMITY ROM:   10  degree left valgus deformity Active ROM Right eval Left eval  Hip flexion    Hip extension    Hip abduction    Hip adduction    Hip internal rotation    Hip external rotation    Knee flexion 113 106  Knee extension 0 0  Ankle dorsiflexion    Ankle plantarflexion    Ankle inversion    Ankle eversion     (Blank rows = not tested)  LOWER EXTREMITY MMT:  HD lbs Right eval Left eval  Hip flexion 45.0 38.8  Hip extension    Hip abduction 32.5 34.1  Hip adduction    Hip internal rotation    Hip external rotation    Knee flexion    Knee extension 34.4 34.1  Ankle dorsiflexion    Ankle plantarflexion    Ankle inversion    Ankle eversion     (Blank rows = not tested)   FUNCTIONAL TESTS:  Timed up and go (TUG): 22.17 4 stage balance: passed 1&2; tandem x 8s; SLS x 2s  GAIT: Distance walked: 400 ft Assistive device utilized: Single point cane Level of assistance: Complete Independence Comments: Bilat knee valgus deformities; slight hip circumduction to allow for advancement of LE; knees knocking, increased lateral displacement                                                                                                                                TREATMENT [facility] Adult PT Treatment:                                                DATE: 01/12/24 Pt seen for aquatic therapy today.  Treatment took place in water 3.5-4.75 ft in depth at the [facility] pool. Temp of water was 91.  Pt entered/exited the pool via stairs in step-to pattern with bilat rail.  - unsupported walking forward/ backward, cues for even step length -  with rainbow hand floats side stepping, with arm addct/ abdct ->slight lunge x 4 widths -suitcase carry bilat ue then unilateral - UE RBHB:  toe/heel raises x 10; hip add/abd x 10  ; hip flexion /extension x 10 alternating RLE -seated rest period -stair negotiation. Cues for proper technique with completion x2-3 -step ups leading R x 10  unilateral ue support - seated on lift: laq 3-4 sets of 10 fast/10 slow; repeated with hip add/abd -STS from 3rd step   Pt requires the buoyancy and hydrostatic pressure of water for support, and to offload joints by unweighting joint load by at least 50 % in navel deep water and by at least 75-80% in chest to neck deep water.  Viscosity of the water is needed for resistance of strengthening. Water current perturbations provides challenge to standing balance requiring increased core activation.     PATIENT EDUCATION:  Education details: Intro to aquatic therapy Person educated: Patient Education method: Explanation Education comprehension: verbalized understanding  HOME EXERCISE PROGRAM: TBA  ASSESSMENT:  CLINICAL IMPRESSION: Progressed proprioceptive and balance re-training. Decreased ue support with exercise with cues on controled SLS/core stabilization. Good toleration and execution.  Pt reports no knee pain per say, just crepitus and stiffness.  She will bring in HEP given to her from her MD next appt. Goals ongoing       Initial impression:  Patient is a 63 y.o. f who was seen today for physical therapy evaluation and treatment for Bilateral primary osteoarthritis of knee . She presents today with gross bilateral knee valgus deformities impairing gait using a SPC. She is preparing for left TKR at the end of June and will likely follow with right before end of year.  She is a high fall risk due to balance deficits, she does have some muscle weakness and demonstrates difficulty with transfers due to progressed OA.  Her goals are to improve her toleration to activity and eventually return to walking up toward 2 miles.  She is a good candidate for aquatic therapy intervention to improve strength, maintain/progress bilat knee ROM, improve balance and safety with amb and prepare for upcoming surgical procedure. Will plan to conider land based interventionas therapy progresses.  OBJECTIVE  IMPAIRMENTS: Abnormal gait, decreased balance, decreased endurance, decreased mobility, difficulty walking, decreased ROM, decreased strength, obesity, and pain.   ACTIVITY LIMITATIONS: carrying, lifting, bending, sitting, standing, squatting, sleeping, stairs, transfers, and locomotion level  PARTICIPATION LIMITATIONS: meal prep, cleaning, laundry, shopping, and community activity  PERSONAL FACTORS: see PmHx are also affecting patient's functional outcome.   REHAB POTENTIAL: Good  CLINICAL DECISION MAKING: Stable/uncomplicated  EVALUATION COMPLEXITY: Low   GOALS: Goals reviewed with patient? Yes  SHORT TERM GOALS: Target date: 01/20/24 Pt will tolerate full aquatic sessions consistently without increase in pain and with improving function to demonstrate good toleration and effectiveness of intervention.  Baseline: Goal status: MET - 01/07/24  2.  Pt will have initial aquatic HEP and will complete indep at personal pool access Baseline:  Goal status: INITIAL  3.  Pt will report 0/10 pain in knees with submerged activity Baseline:  Goal status: MET - 01/07/24  4.  Pt will tolerate 10+ minutes of continuous amb in upright position submerged to demonstrate improved toleration to amb Baseline: leaning forward on grocery cart. Goal status: INITIAL    LONG TERM GOALS: Target date: 02/27/24  Pt to improve on LEFS by at least 9 point to demonstrate statistically significant Improvement in function. Baseline:33/80  Goal status: INITIAL  2.  Pt will improve on Tug test to <or= 15s to demonstrate improvement in lower extremity function, mobility and decreased fall risk. Baseline:22.17  Goal status: INITIAL  3.  Pt will perform SLS in 3.6 ft unsupported x 20s to demonstrate improved balance ability Baseline:  Goal status: INITIAL  4.  Pt will be indep with final  HEP's (land and aquatic as appropriate) for continued management of condition Baseline:  Goal status: INITIAL  5.  Pt  will report decrease in pain by at least 50% for improved toleration to activity/quality of life and to demonstrate improved management of pain. Baseline:  Goal status: INITIAL     PLAN:  PT FREQUENCY: 1-2x/week  PT DURATION: 8 weeks (extended due to scheduling conflicts)  PLANNED INTERVENTIONS: 40981- PT Re-evaluation, 97110-Therapeutic exercises, 97530- Therapeutic activity, 97112- Neuromuscular re-education, 97535- Self Care, 19147- Manual therapy, [phone]- Gait training, [phone]- Aquatic Therapy, [phone]- Electrical stimulation (unattended), [phone]- Ionotophoresis 4mg /ml Dexamethasone , Patient/Family education, Balance training, Stair training, Taping, Dry Needling, Joint mobilization, DME instructions, Cryotherapy, and Moist heat  PLAN FOR NEXT SESSION: aquatics: LE stretching and strengthening, balance retraining, proprioceptive retraining, pain management, gait tolerance  Almedia Jacobsen, PTA 01/12/24 11:03 AM [facility] [address] [place], [place], [address] Phone: [phone]   Fax:  [phone]      Referring diagnosis? M17.0 (ICD-10-CM) - Bilateral primary osteoarthritis of knee  Treatment diagnosis? (if different than referring diagnosis) no What was this (referring dx) caused by? []  Surgery []  Fall []  Ongoing issue [x]  Arthritis []  Other: ____________  Laterality: []  Rt []  Lt [x]  Both  Check all possible CPT codes:  *CHOOSE 10 OR LESS*    See Planned Interventions listed in the Plan section of the Evaluation.

## 2024-01-12 NOTE — Therapy (Deleted)
 [facility] at [facility] [address] [place], [place], [address] Phone: [phone]   Fax:  [phone]  Patient Details  Name: Latasha [name] MRN: [id number] Date of Birth: [birth year] Referring Provider:  [name], PA-C  Encounter Date: 01/12/2024   Frankie Evella Kasal, PT 01/12/2024, 10:20 AM  [facility] [address] [place], [place], [address] Phone: [phone]   Fax:  [phone]

## 2024-01-16 ENCOUNTER — Ambulatory Visit (HOSPITAL_BASED_OUTPATIENT_CLINIC_OR_DEPARTMENT_OTHER): Payer: Self-pay | Admitting: Physical Therapy

## 2024-01-16 ENCOUNTER — Encounter (HOSPITAL_BASED_OUTPATIENT_CLINIC_OR_DEPARTMENT_OTHER): Payer: Self-pay | Admitting: Physical Therapy

## 2024-01-16 DIAGNOSIS — M25561 Pain in right knee: Secondary | ICD-10-CM | POA: Diagnosis not present

## 2024-01-16 DIAGNOSIS — M6281 Muscle weakness (generalized): Secondary | ICD-10-CM

## 2024-01-16 DIAGNOSIS — R269 Unspecified abnormalities of gait and mobility: Secondary | ICD-10-CM

## 2024-01-16 DIAGNOSIS — G8929 Other chronic pain: Secondary | ICD-10-CM

## 2024-01-16 NOTE — Therapy (Signed)
 OUTPATIENT PHYSICAL THERAPY LOWER EXTREMITY TREATMENT   Patient Name: Latasha [name] MRN: [id number] DOB:06/25/1961, 63 y.o., female Today's Date: 01/16/2024  END OF SESSION:  PT End of Session - 01/16/24 1106     Visit Number 6    Number of Visits 12    Date for PT Re-Evaluation 02/27/24    PT Start Time 1102    PT Stop Time 1140    PT Time Calculation (min) 38 min    Activity Tolerance Patient tolerated treatment well    Behavior During Therapy WFL for tasks assessed/performed                Past Medical History:  Diagnosis Date   Anxiety    Arthritis    Chronic fatigue    Endometriosis    Family history of osteoporosis    GERD (gastroesophageal reflux disease)    Heart murmur    History of kidney stones    Hypertension    IBS (irritable bowel syndrome)    Obesity    Surgical menopause    Vaginal dryness    Past Surgical History:  Procedure Laterality Date   ABDOMINAL HYSTERECTOMY     tah/bso   TONSILLECTOMY  1976   TOTAL HIP ARTHROPLASTY Right 10/03/2021   Procedure: TOTAL HIP ARTHROPLASTY;  Surgeon: Arlyne Lame, MD;  Location: [facility] ORS;  Service: Orthopedics;  Laterality: Right;   Patient Active Problem List   Diagnosis Date Noted   Hx of total hip arthroplasty, right 10/03/2021   De Quervain's tenosynovitis, right 09/06/2020   BP (high blood pressure) 09/05/2015   Osteoporosis, post-menopausal 09/05/2015   Postsurgical menopause 09/05/2015   Class 1 obesity 09/05/2015   GERD (gastroesophageal reflux disease) 09/05/2015   Endometriosis 09/05/2015   Status post total hysterectomy and bilateral salpingo-oophorectomy 09/05/2015   Primary osteoarthritis of both knees 09/28/2014    PCP: [name] MD/  [name], PA-C    REFERRING PROVIDER: [name] MD  REFERRING DIAG: M17.0 (ICD-10-CM) - Bilateral primary osteoarthritis of knee   THERAPY DIAG:  Chronic pain of both knees  Gait abnormality  Muscle weakness  (generalized)  Rationale for Evaluation and Treatment: Rehabilitation  ONSET DATE: 10 yrs +  SUBJECTIVE:   SUBJECTIVE STATEMENT: 01/16/24 Pt reports increase in bilar knee pain over the past 2 days when she was not in the pool.  Initial subjective: Planned TKR June 30 by Dr [name].  I [facility] up [employer] to get pool access.  Knee pain has begun "playing with my mobility".  I was a walker but am unable to go now.  Also have OA left shoulder may need total shoulder.  Had injection 1 week ago in shoulder has reduced pain.  Not going to do anything now focusing on knee.  PERTINENT HISTORY: Bilateral shoulder pain. 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 yes: NPRS scale: current 3-4/10 Pain location: left knee Pain description: ache Aggravating factors: walking, weight baring Relieving factors: rest  PRECAUTIONS: None  RED FLAGS: None   WEIGHT BEARING RESTRICTIONS: No  FALLS:  Has patient fallen in last 6 months? No  LIVING ENVIRONMENT: Lives with: lives with their family Lives in: House/apartment Stairs: 5 steps to exit home handrail Has following equipment at home: Single point cane  OCCUPATION: retired  PLOF: Requires assistive device for independence  PATIENT GOALS: get back to walking, dance at sons wedding in [place],  [place] MD VISIT:  2 weeks  OBJECTIVE:  Note:  Objective measures were completed at Evaluation unless otherwise noted.  DIAGNOSTIC FINDINGS: OA bilat knee  PATIENT SURVEYS:  LEFS 33/80  COGNITION: Overall cognitive status: Within functional limits for tasks assessed     SENSATION: WFL  EDEMA:  Slight pitted edema +1 left ankle, slight diffuse edema throughout left knee   MUSCLE LENGTH: Hamstrings: wfl   POSTURE: rounded shoulders, weight shift right, and bilat valgus deformities (left ~10d)  PALPATION: TTP about medial left knee joint line  LOWER EXTREMITY ROM:   10 degree left valgus deformity Active  ROM Right eval Left eval  Hip flexion    Hip extension    Hip abduction    Hip adduction    Hip internal rotation    Hip external rotation    Knee flexion 113 106  Knee extension 0 0  Ankle dorsiflexion    Ankle plantarflexion    Ankle inversion    Ankle eversion     (Blank rows = not tested)  LOWER EXTREMITY MMT:  HD lbs Right eval Left eval  Hip flexion 45.0 38.8  Hip extension    Hip abduction 32.5 34.1  Hip adduction    Hip internal rotation    Hip external rotation    Knee flexion    Knee extension 34.4 34.1  Ankle dorsiflexion    Ankle plantarflexion    Ankle inversion    Ankle eversion     (Blank rows = not tested)   FUNCTIONAL TESTS:  Timed up and go (TUG): 22.17 4 stage balance: passed 1&2; tandem x 8s; SLS x 2s  GAIT: Distance walked: 400 ft Assistive device utilized: Single point cane Level of assistance: Complete Independence Comments: Bilat knee valgus deformities; slight hip circumduction to allow for advancement of LE; knees knocking, increased lateral displacement                                                                                                                                TREATMENT [facility] Adult PT Treatment:                                                DATE: 01/16/24 Pt seen for aquatic therapy today.  Treatment took place in water 3.5-4.75 ft in depth at the [facility] pool. Temp of water was 91.  Pt entered/exited the pool via stairs in step-to pattern with bilat rail.  - unsupported walking forward/ backward, cues for even step length -  with rainbow hand floats side stepping, with arm addct/ abdct  - UE RBHB:  toe/heel raises x 10; hip add/abd x 10  ; hip flexion /extension x 10 alternating RLE; squats relaxed then traditional -forward kicks x 3 widths -SLS 3.8 ft ue support yellow HB R/L x 20s->3.6 ft R/L x 20s Squatted rest period -step ups  leading R x 10 unilateral ue support -suitcase carry bilat ue then  unilateral - seated on lift: laq 3 sets of 10 fast/10 slow; repeated with hip add/abd -seated rest period between   Pt requires the buoyancy and hydrostatic pressure of water for support, and to offload joints by unweighting joint load by at least 50 % in navel deep water and by at least 75-80% in chest to neck deep water.  Viscosity of the water is needed for resistance of strengthening. Water current perturbations provides challenge to standing balance requiring increased core activation.     PATIENT EDUCATION:  Education details: Intro to aquatic therapy Person educated: Patient Education method: Explanation Education comprehension: verbalized understanding  HOME EXERCISE PROGRAM: TBA  ASSESSMENT:  CLINICAL IMPRESSION: Some breakthrough pain last 2 days between sessions. Per Pt "I feel better after the water therapy". Continued to focus on balance, proprioception and strengthening.  Progressed proprioceptive and balance re-training. Decreased ue support with exercise with cues on controled SLS/core stabilization. Good toleration and execution.        Initial impression:  Patient is a 63 y.o. f who was seen today for physical therapy evaluation and treatment for Bilateral primary osteoarthritis of knee . She presents today with gross bilateral knee valgus deformities impairing gait using a SPC. She is preparing for left TKR at the end of June and will likely follow with right before end of year.  She is a high fall risk due to balance deficits, she does have some muscle weakness and demonstrates difficulty with transfers due to progressed OA.  Her goals are to improve her toleration to activity and eventually return to walking up toward 2 miles.  She is a good candidate for aquatic therapy intervention to improve strength, maintain/progress bilat knee ROM, improve balance and safety with amb and prepare for upcoming surgical procedure. Will plan to conider land based interventionas  therapy progresses.  OBJECTIVE IMPAIRMENTS: Abnormal gait, decreased balance, decreased endurance, decreased mobility, difficulty walking, decreased ROM, decreased strength, obesity, and pain.   ACTIVITY LIMITATIONS: carrying, lifting, bending, sitting, standing, squatting, sleeping, stairs, transfers, and locomotion level  PARTICIPATION LIMITATIONS: meal prep, cleaning, laundry, shopping, and community activity  PERSONAL FACTORS: see PmHx are also affecting patient's functional outcome.   REHAB POTENTIAL: Good  CLINICAL DECISION MAKING: Stable/uncomplicated  EVALUATION COMPLEXITY: Low   GOALS: Goals reviewed with patient? Yes  SHORT TERM GOALS: Target date: 01/20/24 Pt will tolerate full aquatic sessions consistently without increase in pain and with improving function to demonstrate good toleration and effectiveness of intervention.  Baseline: Goal status: MET - 01/07/24  2.  Pt will have initial aquatic HEP and will complete indep at personal pool access Baseline:  Goal status: INITIAL  3.  Pt will report 0/10 pain in knees with submerged activity Baseline:  Goal status: MET - 01/07/24  4.  Pt will tolerate 10+ minutes of continuous amb in upright position submerged to demonstrate improved toleration to amb Baseline: leaning forward on grocery cart. Goal status: INITIAL    LONG TERM GOALS: Target date: 02/27/24  Pt to improve on LEFS by at least 9 point to demonstrate statistically significant Improvement in function. Baseline:33/80  Goal status: INITIAL  2.  Pt will improve on Tug test to <or= 15s to demonstrate improvement in lower extremity function, mobility and decreased fall risk. Baseline:22.17  Goal status: INITIAL  3.  Pt will perform SLS in 3.6 ft unsupported x 20s to demonstrate improved balance ability Baseline:  Goal status:  INITIAL  4.  Pt will be indep with final HEP's (land and aquatic as appropriate) for continued management of condition Baseline:   Goal status: INITIAL  5.  Pt will report decrease in pain by at least 50% for improved toleration to activity/quality of life and to demonstrate improved management of pain. Baseline:  Goal status: INITIAL     PLAN:  PT FREQUENCY: 1-2x/week  PT DURATION: 8 weeks (extended due to scheduling conflicts)  PLANNED INTERVENTIONS: 91478- PT Re-evaluation, 97110-Therapeutic exercises, 97530- Therapeutic activity, 97112- Neuromuscular re-education, 97535- Self Care, 29562- Manual therapy, [phone]- Gait training, [phone]- Aquatic Therapy, [phone]- Electrical stimulation (unattended), [phone]- Ionotophoresis 4mg /ml Dexamethasone , Patient/Family education, Balance training, Stair training, Taping, Dry Needling, Joint mobilization, DME instructions, Cryotherapy, and Moist heat  PLAN FOR NEXT SESSION: aquatics: LE stretching and strengthening, balance retraining, proprioceptive retraining, pain management, gait tolerance  Almedia Jacobsen, PTA 01/16/24 11:07 AM [facility] [address] [place], [place], [address] Phone: [phone]   Fax:  [phone]      Referring diagnosis? M17.0 (ICD-10-CM) - Bilateral primary osteoarthritis of knee  Treatment diagnosis? (if different than referring diagnosis) no What was this (referring dx) caused by? []  Surgery []  Fall []  Ongoing issue [x]  Arthritis []  Other: ____________  Laterality: []  Rt []  Lt [x]  Both  Check all possible CPT codes:  *CHOOSE 10 OR LESS*    See Planned Interventions listed in the Plan section of the Evaluation.

## 2024-01-19 ENCOUNTER — Encounter (HOSPITAL_BASED_OUTPATIENT_CLINIC_OR_DEPARTMENT_OTHER): Payer: Self-pay | Admitting: Physical Therapy

## 2024-01-19 ENCOUNTER — Ambulatory Visit (HOSPITAL_BASED_OUTPATIENT_CLINIC_OR_DEPARTMENT_OTHER): Payer: Self-pay | Admitting: Physical Therapy

## 2024-01-19 DIAGNOSIS — M6281 Muscle weakness (generalized): Secondary | ICD-10-CM

## 2024-01-19 DIAGNOSIS — R293 Abnormal posture: Secondary | ICD-10-CM

## 2024-01-19 DIAGNOSIS — G8929 Other chronic pain: Secondary | ICD-10-CM

## 2024-01-19 DIAGNOSIS — R269 Unspecified abnormalities of gait and mobility: Secondary | ICD-10-CM

## 2024-01-19 DIAGNOSIS — M25561 Pain in right knee: Secondary | ICD-10-CM | POA: Diagnosis not present

## 2024-01-19 NOTE — Therapy (Signed)
 OUTPATIENT PHYSICAL THERAPY LOWER EXTREMITY TREATMENT   Patient Name: Latasha [name] MRN: [id number] DOB:12-Nov-1960, 63 y.o., female Today's Date: 01/19/2024  END OF SESSION:  PT End of Session - 01/19/24 1448     Visit Number 7    Number of Visits 12    Date for PT Re-Evaluation 02/27/24    PT Start Time 1446    PT Stop Time 1525    PT Time Calculation (min) 39 min    Activity Tolerance Patient tolerated treatment well    Behavior During Therapy WFL for tasks assessed/performed                Past Medical History:  Diagnosis Date   Anxiety    Arthritis    Chronic fatigue    Endometriosis    Family history of osteoporosis    GERD (gastroesophageal reflux disease)    Heart murmur    History of kidney stones    Hypertension    IBS (irritable bowel syndrome)    Obesity    Surgical menopause    Vaginal dryness    Past Surgical History:  Procedure Laterality Date   ABDOMINAL HYSTERECTOMY     tah/bso   TONSILLECTOMY  1976   TOTAL HIP ARTHROPLASTY Right 10/03/2021   Procedure: TOTAL HIP ARTHROPLASTY;  Surgeon: Arlyne Lame, MD;  Location: [facility] ORS;  Service: Orthopedics;  Laterality: Right;   Patient Active Problem List   Diagnosis Date Noted   Hx of total hip arthroplasty, right 10/03/2021   De Quervain's tenosynovitis, right 09/06/2020   BP (high blood pressure) 09/05/2015   Osteoporosis, post-menopausal 09/05/2015   Postsurgical menopause 09/05/2015   Class 1 obesity 09/05/2015   GERD (gastroesophageal reflux disease) 09/05/2015   Endometriosis 09/05/2015   Status post total hysterectomy and bilateral salpingo-oophorectomy 09/05/2015   Primary osteoarthritis of both knees 09/28/2014    PCP: [name] MD/  [name], PA-C    REFERRING PROVIDER: [name] MD  REFERRING DIAG: M17.0 (ICD-10-CM) - Bilateral primary osteoarthritis of knee   THERAPY DIAG:  Chronic pain of both knees  Gait abnormality  Muscle weakness  (generalized)  Abnormal posture  Rationale for Evaluation and Treatment: Rehabilitation  ONSET DATE: 10 yrs +  SUBJECTIVE:   SUBJECTIVE STATEMENT: 01/19/24 Pt reports right knee pain high today with right hand pain (OA)  Initial subjective: Planned TKR June 30 by Dr [name].  I [facility] up [employer] to get [facility] access.  Knee pain has begun "playing with my mobility".  I was a walker but am unable to go now.  Also have OA left shoulder may need total shoulder.  Had injection 1 week ago in shoulder has reduced pain.  Not going to do anything now focusing on knee.  PERTINENT HISTORY: Bilateral shoulder pain. 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 yes: NPRS scale: current 7/10 Pain location: left knee Pain description: ache Aggravating factors: walking, weight baring Relieving factors: rest  PRECAUTIONS: None  RED FLAGS: None   WEIGHT BEARING RESTRICTIONS: No  FALLS:  Has patient fallen in last 6 months? No  LIVING ENVIRONMENT: Lives with: lives with their family Lives in: House/apartment Stairs: 5 steps to exit home handrail Has following equipment at home: Single point cane  OCCUPATION: retired  PLOF: Requires assistive device for independence  PATIENT GOALS: get back to walking, dance at sons wedding in [place],  [place] MD VISIT:  2 weeks  OBJECTIVE:  Note: Objective measures were completed  at Evaluation unless otherwise noted.  DIAGNOSTIC FINDINGS: OA bilat knee  PATIENT SURVEYS:  LEFS 33/80  COGNITION: Overall cognitive status: Within functional limits for tasks assessed     SENSATION: WFL  EDEMA:  Slight pitted edema +1 left ankle, slight diffuse edema throughout left knee   MUSCLE LENGTH: Hamstrings: wfl   POSTURE: rounded shoulders, weight shift right, and bilat valgus deformities (left ~10d)  PALPATION: TTP about medial left knee joint line  LOWER EXTREMITY ROM:   10 degree left valgus deformity Active ROM  Right eval Left eval  Hip flexion    Hip extension    Hip abduction    Hip adduction    Hip internal rotation    Hip external rotation    Knee flexion 113 106  Knee extension 0 0  Ankle dorsiflexion    Ankle plantarflexion    Ankle inversion    Ankle eversion     (Blank rows = not tested)  LOWER EXTREMITY MMT:  HD lbs Right eval Left eval  Hip flexion 45.0 38.8  Hip extension    Hip abduction 32.5 34.1  Hip adduction    Hip internal rotation    Hip external rotation    Knee flexion    Knee extension 34.4 34.1  Ankle dorsiflexion    Ankle plantarflexion    Ankle inversion    Ankle eversion     (Blank rows = not tested)   FUNCTIONAL TESTS:  Timed up and go (TUG): 22.17 4 stage balance: passed 1&2; tandem x 8s; SLS x 2s  GAIT: Distance walked: 400 ft Assistive device utilized: Single point cane Level of assistance: Complete Independence Comments: Bilat knee valgus deformities; slight hip circumduction to allow for advancement of LE; knees knocking, increased lateral displacement                                                                                                                                TREATMENT [facility] Adult PT Treatment:                                                DATE: 01/19/24 Pt seen for aquatic therapy today.  Treatment took place in water 3.5-4.75 ft in depth at the [facility] pool. Temp of water was 91.  Pt entered/exited the pool via stairs in step-to pattern with bilat rail.  - unsupported walking forward/ backward, cues for even step length -  with rainbow hand floats side stepping,-> high stepping -forward kicks x 3 widths - UE RBHB:  toe/heel raises x 10; hip add/abd x 10  ; hip flexion /extension x 10 alternating RLE; squats relaxed then traditional -STS from bench onto water step x 10 -hollow noodle pull down for TrA engagement (lightly irritates left shoulder -suitcase carry bilat ue then unilateral -  straddling  noodle cycling->breast stroke arms -squatted rest period between exercises as needed   Pt requires the buoyancy and hydrostatic pressure of water for support, and to offload joints by unweighting joint load by at least 50 % in navel deep water and by at least 75-80% in chest to neck deep water.  Viscosity of the water is needed for resistance of strengthening. Water current perturbations provides challenge to standing balance requiring increased core activation.     PATIENT EDUCATION:  Education details: Intro to aquatic therapy Person educated: Patient Education method: Explanation Education comprehension: verbalized understanding  HOME EXERCISE PROGRAM: TBA  ASSESSMENT:  CLINICAL IMPRESSION: Pt reports good reduction in pain in knees with improved function for a few days after last session.  Pain does resurface by 3rd day.  Added some core engagement today which is tolerated well but limited due to slight left shoulder discomfort.  Good toleration to le exercises. Pt demonstrates improving core strength and coordination with balancing on noodle without LOB. Reduction of knee pain throughout session.  Goals ongoing .    Initial impression:  Patient is a 63 y.o. f who was seen today for physical therapy evaluation and treatment for Bilateral primary osteoarthritis of knee . She presents today with gross bilateral knee valgus deformities impairing gait using a SPC. She is preparing for left TKR at the end of June and will likely follow with right before end of year.  She is a high fall risk due to balance deficits, she does have some muscle weakness and demonstrates difficulty with transfers due to progressed OA.  Her goals are to improve her toleration to activity and eventually return to walking up toward 2 miles.  She is a good candidate for aquatic therapy intervention to improve strength, maintain/progress bilat knee ROM, improve balance and safety with amb and prepare for upcoming  surgical procedure. Will plan to conider land based interventionas therapy progresses.  OBJECTIVE IMPAIRMENTS: Abnormal gait, decreased balance, decreased endurance, decreased mobility, difficulty walking, decreased ROM, decreased strength, obesity, and pain.   ACTIVITY LIMITATIONS: carrying, lifting, bending, sitting, standing, squatting, sleeping, stairs, transfers, and locomotion level  PARTICIPATION LIMITATIONS: meal prep, cleaning, laundry, shopping, and community activity  PERSONAL FACTORS: see PmHx are also affecting patient's functional outcome.   REHAB POTENTIAL: Good  CLINICAL DECISION MAKING: Stable/uncomplicated  EVALUATION COMPLEXITY: Low   GOALS: Goals reviewed with patient? Yes  SHORT TERM GOALS: Target date: 01/20/24 Pt will tolerate full aquatic sessions consistently without increase in pain and with improving function to demonstrate good toleration and effectiveness of intervention.  Baseline: Goal status: MET - 01/07/24  2.  Pt will have initial aquatic HEP and will complete indep at personal pool access Baseline:  Goal status: INITIAL  3.  Pt will report 0/10 pain in knees with submerged activity Baseline:  Goal status: MET - 01/07/24  4.  Pt will tolerate 10+ minutes of continuous amb in upright position submerged to demonstrate improved toleration to amb Baseline: leaning forward on grocery cart. Goal status: INITIAL    LONG TERM GOALS: Target date: 02/27/24  Pt to improve on LEFS by at least 9 point to demonstrate statistically significant Improvement in function. Baseline:33/80  Goal status: INITIAL  2.  Pt will improve on Tug test to <or= 15s to demonstrate improvement in lower extremity function, mobility and decreased fall risk. Baseline:22.17  Goal status: INITIAL  3.  Pt will perform SLS in 3.6 ft unsupported x 20s to demonstrate improved balance ability Baseline:  Goal status: INITIAL  4.  Pt will be indep with final HEP's (land and aquatic  as appropriate) for continued management of condition Baseline:  Goal status: INITIAL  5.  Pt will report decrease in pain by at least 50% for improved toleration to activity/quality of life and to demonstrate improved management of pain. Baseline:  Goal status: INITIAL     PLAN:  PT FREQUENCY: 1-2x/week  PT DURATION: 8 weeks (extended due to scheduling conflicts)  PLANNED INTERVENTIONS: 91478- PT Re-evaluation, 97110-Therapeutic exercises, 97530- Therapeutic activity, 97112- Neuromuscular re-education, 97535- Self Care, 29562- Manual therapy, [phone]- Gait training, [phone]- Aquatic Therapy, [phone]- Electrical stimulation (unattended), [phone]- Ionotophoresis 4mg /ml Dexamethasone , Patient/Family education, Balance training, Stair training, Taping, Dry Needling, Joint mobilization, DME instructions, Cryotherapy, and Moist heat  PLAN FOR NEXT SESSION: aquatics: LE stretching and strengthening, balance retraining, proprioceptive retraining, pain management, gait tolerance  Lucinda Saber) Woodie Trusty MPT 01/19/24 3:30 PM [facility] [address] [place], [place], [address] Phone: [phone]   Fax:  [phone]       Referring diagnosis? M17.0 (ICD-10-CM) - Bilateral primary osteoarthritis of knee  Treatment diagnosis? (if different than referring diagnosis) no What was this (referring dx) caused by? []  Surgery []  Fall []  Ongoing issue [x]  Arthritis []  Other: ____________  Laterality: []  Rt []  Lt [x]  Both  Check all possible CPT codes:  *CHOOSE 10 OR LESS*    See Planned Interventions listed in the Plan section of the Evaluation.

## 2024-01-23 ENCOUNTER — Ambulatory Visit (HOSPITAL_BASED_OUTPATIENT_CLINIC_OR_DEPARTMENT_OTHER): Admitting: Physical Therapy

## 2024-01-23 ENCOUNTER — Encounter (HOSPITAL_BASED_OUTPATIENT_CLINIC_OR_DEPARTMENT_OTHER): Payer: Self-pay | Admitting: Physical Therapy

## 2024-01-23 DIAGNOSIS — R269 Unspecified abnormalities of gait and mobility: Secondary | ICD-10-CM

## 2024-01-23 DIAGNOSIS — M6281 Muscle weakness (generalized): Secondary | ICD-10-CM

## 2024-01-23 DIAGNOSIS — M25561 Pain in right knee: Secondary | ICD-10-CM | POA: Diagnosis not present

## 2024-01-23 DIAGNOSIS — G8929 Other chronic pain: Secondary | ICD-10-CM

## 2024-01-23 DIAGNOSIS — R293 Abnormal posture: Secondary | ICD-10-CM

## 2024-01-23 NOTE — Therapy (Signed)
 OUTPATIENT PHYSICAL THERAPY LOWER EXTREMITY TREATMENT   Patient Name: Latasha [name] MRN: [id number] DOB:[birth year], 63 y.o., female Today's Date: 01/23/2024  END OF SESSION:  PT End of Session - 01/23/24 1431     Visit Number 8    Number of Visits 12    Date for PT Re-Evaluation 02/27/24    PT Start Time 1300    PT Stop Time 1345    PT Time Calculation (min) 45 min    Activity Tolerance Patient tolerated treatment well    Behavior During Therapy WFL for tasks assessed/performed                 Past Medical History:  Diagnosis Date   Anxiety    Arthritis    Chronic fatigue    Endometriosis    Family history of osteoporosis    GERD (gastroesophageal reflux disease)    Heart murmur    History of kidney stones    Hypertension    IBS (irritable bowel syndrome)    Obesity    Surgical menopause    Vaginal dryness    Past Surgical History:  Procedure Laterality Date   ABDOMINAL HYSTERECTOMY     tah/bso   TONSILLECTOMY  1976   TOTAL HIP ARTHROPLASTY Right 10/03/2021   Procedure: TOTAL HIP ARTHROPLASTY;  Surgeon: Arlyne Lame, MD;  Location: [facility] ORS;  Service: Orthopedics;  Laterality: Right;   Patient Active Problem List   Diagnosis Date Noted   Hx of total hip arthroplasty, right 10/03/2021   De Quervain's tenosynovitis, right 09/06/2020   BP (high blood pressure) 09/05/2015   Osteoporosis, post-menopausal 09/05/2015   Postsurgical menopause 09/05/2015   Class 1 obesity 09/05/2015   GERD (gastroesophageal reflux disease) 09/05/2015   Endometriosis 09/05/2015   Status post total hysterectomy and bilateral salpingo-oophorectomy 09/05/2015   Primary osteoarthritis of both knees 09/28/2014    PCP: [name] MD/  [name], PA-C    REFERRING PROVIDER: [name] MD  REFERRING DIAG: M17.0 (ICD-10-CM) - Bilateral primary osteoarthritis of knee   THERAPY DIAG:  Chronic pain of both knees  Gait abnormality  Muscle weakness  (generalized)  Abnormal posture  Rationale for Evaluation and Treatment: Rehabilitation  ONSET DATE: 10 yrs +  SUBJECTIVE:   SUBJECTIVE STATEMENT: 01/23/24 Pt reports she feels tight everywhere.  Didn't sleep well; restless last night.  "I wish I could come to the pool 5 days a week".  Pt states that the surgeon's office asked if she wanted to move up surgery 2 wks, but she reports she is not ready yet; would like to build as much strength before hand.  She has not been over to [facility] pool yet.    Initial subjective: Planned TKR June 30 by Dr [name].  I signed up [employer] to get pool access.  Knee pain has begun "playing with my mobility".  I was a walker but am unable to go now.  Also have OA left shoulder may need total shoulder.  Had injection 1 week ago in shoulder has reduced pain.  Not going to do anything now focusing on knee.  PERTINENT HISTORY: Bilateral shoulder pain. 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 yes: NPRS scale: current 4/10, lower back, 2/10 L knee Pain location: see above  Pain description: ache Aggravating factors: walking, weight baring Relieving factors: rest  PRECAUTIONS: None  RED FLAGS: None   WEIGHT BEARING RESTRICTIONS: No  FALLS:  Has patient fallen in last  6 months? No  LIVING ENVIRONMENT: Lives with: lives with their family Lives in: House/apartment Stairs: 5 steps to exit home handrail Has following equipment at home: Single point cane  OCCUPATION: retired  PLOF: Requires assistive device for independence  PATIENT GOALS: get back to walking, dance at sons wedding in [place],  [place] MD VISIT:  2 weeks  OBJECTIVE:  Note: Objective measures were completed at Evaluation unless otherwise noted.  DIAGNOSTIC FINDINGS: OA bilat knee  PATIENT SURVEYS:  LEFS 33/80  COGNITION: Overall cognitive status: Within functional limits for tasks assessed     SENSATION: WFL  EDEMA:  Slight pitted edema  +1 left ankle, slight diffuse edema throughout left knee   MUSCLE LENGTH: Hamstrings: wfl   POSTURE: rounded shoulders, weight shift right, and bilat valgus deformities (left ~10d)  PALPATION: TTP about medial left knee joint line  LOWER EXTREMITY ROM:   10 degree left valgus deformity Active ROM Right eval Left eval  Hip flexion    Hip extension    Hip abduction    Hip adduction    Hip internal rotation    Hip external rotation    Knee flexion 113 106  Knee extension 0 0  Ankle dorsiflexion    Ankle plantarflexion    Ankle inversion    Ankle eversion     (Blank rows = not tested)  LOWER EXTREMITY MMT:  HD lbs Right eval Left eval  Hip flexion 45.0 38.8  Hip extension    Hip abduction 32.5 34.1  Hip adduction    Hip internal rotation    Hip external rotation    Knee flexion    Knee extension 34.4 34.1  Ankle dorsiflexion    Ankle plantarflexion    Ankle inversion    Ankle eversion     (Blank rows = not tested)   FUNCTIONAL TESTS:  Timed up and go (TUG): 22.17 4 stage balance: passed 1&2; tandem x 8s; SLS x 2s  GAIT: Distance walked: 400 ft Assistive device utilized: Single point cane Level of assistance: Complete Independence Comments: Bilat knee valgus deformities; slight hip circumduction to allow for advancement of LE; knees knocking, increased lateral displacement                                                                                                                                TREATMENT [facility] Adult PT Treatment:                                                DATE: 01/23/24 Pt seen for aquatic therapy today.  Treatment took place in water 3.5-4.75 ft in depth at the [facility] pool. Temp of water was 91.  Pt entered/exited the pool via stairs in step-to pattern with bilat rail.  - unsupported continuous walking forward/ backward/ side stepping (  with/without UE abdct with rainbow hand floats)- 10.5 min  cues for even step  length -  UE on wall:  hip abdct/ addct 2 x 15 each; hamstring curls x 10 each; marching in place -suitcase carry, unilateral rainbow hand float at side walking forward / backward - staggered stance kick board row with kick board 2 x 10 each  forward  - at stairs, LLE on 2nd step, forward lunge <-> hamstring stretch x 6 - STS from bench in water with heavy cues and demo for sequence  x8  Pt requires the buoyancy and hydrostatic pressure of water for support, and to offload joints by unweighting joint load by at least 50 % in navel deep water and by at least 75-80% in chest to neck deep water.  Viscosity of the water is needed for resistance of strengthening. Water current perturbations provides challenge to standing balance requiring increased core activation.     PATIENT EDUCATION:  Education details: Intro to aquatic therapy Person educated: Patient Education method: Explanation Education comprehension: verbalized understanding  HOME EXERCISE PROGRAM: TBA  ASSESSMENT:  CLINICAL IMPRESSION: Pt reported reduction in back and knee pain.  Was able to complete 10 continues minutes of submerged ambulation in water; met STG4. For planning of care after surgery, pt to inquire with 2 individuals for rides to post- surgery therapy.  Pt to also look into alternate pool to visit for access outside of therapy sessions.  Goals ongoing     Initial impression:  Patient is a 63 y.o. f who was seen today for physical therapy evaluation and treatment for Bilateral primary osteoarthritis of knee . She presents today with gross bilateral knee valgus deformities impairing gait using a SPC. She is preparing for left TKR at the end of June and will likely follow with right before end of year.  She is a high fall risk due to balance deficits, she does have some muscle weakness and demonstrates difficulty with transfers due to progressed OA.  Her goals are to improve her toleration to activity and eventually  return to walking up toward 2 miles.  She is a good candidate for aquatic therapy intervention to improve strength, maintain/progress bilat knee ROM, improve balance and safety with amb and prepare for upcoming surgical procedure. Will plan to conider land based interventionas therapy progresses.  OBJECTIVE IMPAIRMENTS: Abnormal gait, decreased balance, decreased endurance, decreased mobility, difficulty walking, decreased ROM, decreased strength, obesity, and pain.   ACTIVITY LIMITATIONS: carrying, lifting, bending, sitting, standing, squatting, sleeping, stairs, transfers, and locomotion level  PARTICIPATION LIMITATIONS: meal prep, cleaning, laundry, shopping, and community activity  PERSONAL FACTORS: see PmHx are also affecting patient's functional outcome.   REHAB POTENTIAL: Good  CLINICAL DECISION MAKING: Stable/uncomplicated  EVALUATION COMPLEXITY: Low   GOALS: Goals reviewed with patient? Yes  SHORT TERM GOALS: Target date: 01/20/24 Pt will tolerate full aquatic sessions consistently without increase in pain and with improving function to demonstrate good toleration and effectiveness of intervention.  Baseline: Goal status: MET - 01/07/24  2.  Pt will have initial aquatic HEP and will complete indep at personal pool access Baseline:  Goal status: INITIAL  3.  Pt will report 0/10 pain in knees with submerged activity Baseline:  Goal status: MET - 01/07/24  4.  Pt will tolerate 10+ minutes of continuous amb in upright position submerged to demonstrate improved toleration to amb Baseline: leaning forward on grocery cart.(Eval) Goal status: MET- 01/23/24    LONG TERM GOALS: Target date: 02/27/24  Pt  to improve on LEFS by at least 9 point to demonstrate statistically significant Improvement in function. Baseline:33/80  Goal status: INITIAL  2.  Pt will improve on Tug test to <or= 15s to demonstrate improvement in lower extremity function, mobility and decreased fall  risk. Baseline:22.17  Goal status: INITIAL  3.  Pt will perform SLS in 3.6 ft unsupported x 20s to demonstrate improved balance ability Baseline:  Goal status: INITIAL  4.  Pt will be indep with final HEP's (land and aquatic as appropriate) for continued management of condition Baseline:  Goal status: INITIAL  5.  Pt will report decrease in pain by at least 50% for improved toleration to activity/quality of life and to demonstrate improved management of pain. Baseline:  Goal status: INITIAL     PLAN:  PT FREQUENCY: 1-2x/week  PT DURATION: 8 weeks (extended due to scheduling conflicts)  PLANNED INTERVENTIONS: 16109- PT Re-evaluation, 97110-Therapeutic exercises, 97530- Therapeutic activity, 97112- Neuromuscular re-education, 97535- Self Care, 60454- Manual therapy, [phone]- Gait training, [phone]- Aquatic Therapy, [phone]- Electrical stimulation (unattended), [phone]- Ionotophoresis 4mg /ml Dexamethasone , Patient/Family education, Balance training, Stair training, Taping, Dry Needling, Joint mobilization, DME instructions, Cryotherapy, and Moist heat  PLAN FOR NEXT SESSION: aquatics: LE stretching and strengthening, balance retraining, proprioceptive retraining, pain management, gait tolerance   Almedia Jacobsen, PTA 01/23/24 2:38 PM [facility] [address] [place], [place], [address] Phone: [phone]   Fax:  [phone]     Referring diagnosis? M17.0 (ICD-10-CM) - Bilateral primary osteoarthritis of knee  Treatment diagnosis? (if different than referring diagnosis) no What was this (referring dx) caused by? []  Surgery []  Fall []  Ongoing issue [x]  Arthritis []  Other: ____________  Laterality: []  Rt []  Lt [x]  Both  Check all possible CPT codes:  *CHOOSE 10 OR LESS*    See Planned Interventions listed in the Plan section of the Evaluation.

## 2024-01-28 ENCOUNTER — Encounter (HOSPITAL_BASED_OUTPATIENT_CLINIC_OR_DEPARTMENT_OTHER): Payer: Self-pay | Admitting: Physical Therapy

## 2024-01-28 ENCOUNTER — Ambulatory Visit (HOSPITAL_BASED_OUTPATIENT_CLINIC_OR_DEPARTMENT_OTHER): Admitting: Physical Therapy

## 2024-01-28 DIAGNOSIS — M6281 Muscle weakness (generalized): Secondary | ICD-10-CM

## 2024-01-28 DIAGNOSIS — R269 Unspecified abnormalities of gait and mobility: Secondary | ICD-10-CM

## 2024-01-28 DIAGNOSIS — M25561 Pain in right knee: Secondary | ICD-10-CM | POA: Diagnosis not present

## 2024-01-28 DIAGNOSIS — G8929 Other chronic pain: Secondary | ICD-10-CM

## 2024-01-28 DIAGNOSIS — R293 Abnormal posture: Secondary | ICD-10-CM

## 2024-01-28 NOTE — Therapy (Signed)
 OUTPATIENT PHYSICAL THERAPY LOWER EXTREMITY TREATMENT   Patient Name: Latasha [name] MRN: [id number] DOB:1961/07/28, 63 y.o., female Today's Date: 01/28/2024  END OF SESSION:  PT End of Session - 01/28/24 1710     Visit Number 9    Number of Visits 12    Date for PT Re-Evaluation 02/27/24    Authorization Type [employer] m[employer]    PT Start Time 1703    PT Stop Time 1743    PT Time Calculation (min) 40 min    Activity Tolerance Patient tolerated treatment well    Behavior During Therapy WFL for tasks assessed/performed                 Past Medical History:  Diagnosis Date   Anxiety    Arthritis    Chronic fatigue    Endometriosis    Family history of osteoporosis    GERD (gastroesophageal reflux disease)    Heart murmur    History of kidney stones    Hypertension    IBS (irritable bowel syndrome)    Obesity    Surgical menopause    Vaginal dryness    Past Surgical History:  Procedure Laterality Date   ABDOMINAL HYSTERECTOMY     tah/bso   TONSILLECTOMY  1976   TOTAL HIP ARTHROPLASTY Right 10/03/2021   Procedure: TOTAL HIP ARTHROPLASTY;  Surgeon: Arlyne Lame, MD;  Location: [facility] ORS;  Service: Orthopedics;  Laterality: Right;   Patient Active Problem List   Diagnosis Date Noted   Hx of total hip arthroplasty, right 10/03/2021   De Quervain's tenosynovitis, right 09/06/2020   BP (high blood pressure) 09/05/2015   Osteoporosis, post-menopausal 09/05/2015   Postsurgical menopause 09/05/2015   Class 1 obesity 09/05/2015   GERD (gastroesophageal reflux disease) 09/05/2015   Endometriosis 09/05/2015   Status post total hysterectomy and bilateral salpingo-oophorectomy 09/05/2015   Primary osteoarthritis of both knees 09/28/2014    PCP: [name] MD/  [name], PA-C    REFERRING PROVIDER: [name] MD  REFERRING DIAG: M17.0 (ICD-10-CM) - Bilateral primary osteoarthritis of knee   THERAPY DIAG:  Chronic pain of both knees  Gait  abnormality  Muscle weakness (generalized)  Abnormal posture  Rationale for Evaluation and Treatment: Rehabilitation  ONSET DATE: 10 yrs +  SUBJECTIVE:   SUBJECTIVE STATEMENT: 01/28/24 Pt reports tightness in left knee through shin.      Initial subjective: Planned TKR June 30 by Dr [name].  I [facility] up [employer] to get pool access.  Knee pain has begun "playing with my mobility".  I was a walker but am unable to go now.  Also have OA left shoulder may need total shoulder.  Had injection 1 week ago in shoulder has reduced pain.  Not going to do anything now focusing on knee.  PERTINENT HISTORY: Bilateral shoulder pain. 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 yes: NPRS scale: current 4/10, lower back, 2/10 L knee Pain location: see above  Pain description: ache Aggravating factors: walking, weight baring Relieving factors: rest  PRECAUTIONS: None  RED FLAGS: None   WEIGHT BEARING RESTRICTIONS: No  FALLS:  Has patient fallen in last 6 months? No  LIVING ENVIRONMENT: Lives with: lives with their family Lives in: House/apartment Stairs: 5 steps to exit home handrail Has following equipment at home: Single point cane  OCCUPATION: retired  PLOF: Requires assistive device for independence  PATIENT GOALS: get back to walking, dance at sons wedding in [place],  NEXT MD VISIT:  2 weeks  OBJECTIVE:  Note: Objective measures were completed at Evaluation unless otherwise noted.  DIAGNOSTIC FINDINGS: OA bilat knee  PATIENT SURVEYS:  LEFS 33/80  COGNITION: Overall cognitive status: Within functional limits for tasks assessed     SENSATION: WFL  EDEMA:  Slight pitted edema +1 left ankle, slight diffuse edema throughout left knee   MUSCLE LENGTH: Hamstrings: wfl   POSTURE: rounded shoulders, weight shift right, and bilat valgus deformities (left ~10d)  PALPATION: TTP about medial left knee joint line  LOWER EXTREMITY  ROM:   10 degree left valgus deformity Active ROM Right eval Left eval  Hip flexion    Hip extension    Hip abduction    Hip adduction    Hip internal rotation    Hip external rotation    Knee flexion 113 106  Knee extension 0 0  Ankle dorsiflexion    Ankle plantarflexion    Ankle inversion    Ankle eversion     (Blank rows = not tested)  LOWER EXTREMITY MMT:  HD lbs Right eval Left eval  Hip flexion 45.0 38.8  Hip extension    Hip abduction 32.5 34.1  Hip adduction    Hip internal rotation    Hip external rotation    Knee flexion    Knee extension 34.4 34.1  Ankle dorsiflexion    Ankle plantarflexion    Ankle inversion    Ankle eversion     (Blank rows = not tested)   FUNCTIONAL TESTS:  Timed up and go (TUG): 22.17 4 stage balance: passed 1&2; tandem x 8s; SLS x 2s  GAIT: Distance walked: 400 ft Assistive device utilized: Single point cane Level of assistance: Complete Independence Comments: Bilat knee valgus deformities; slight hip circumduction to allow for advancement of LE; knees knocking, increased lateral displacement                                                                                                                                TREATMENT [facility] Adult PT Treatment:                                                DATE: 01/28/24 Pt seen for aquatic therapy today.  Treatment took place in water 3.5-4.75 ft in depth at the [facility] pool. Temp of water was 91.  Pt entered/exited the pool via stairs in step-to pattern with bilat rail.  - unsupported walking forward/ backward/ side stepping  -standing ue support yellow HB high knee marching -forward marching with knee kicks x 2 widths -  UE on wall:  hip abdct/ addct 2 x 15 each; hamstring curls x 15 each; relaxed squats -suitcase carry, unilateral rainbow hand float at side walking forward / backward - at stairs, LLE on 2nd  step, forward lunge <-> hamstring stretch  -gastroc stretch  on bottom step - cycling on noodle; hip add\abd  Pt requires the buoyancy and hydrostatic pressure of water for support, and to offload joints by unweighting joint load by at least 50 % in navel deep water and by at least 75-80% in chest to neck deep water.  Viscosity of the water is needed for resistance of strengthening. Water current perturbations provides challenge to standing balance requiring increased core activation.     PATIENT EDUCATION:  Education details: Intro to aquatic therapy Person educated: Patient Education method: Explanation Education comprehension: verbalized understanding  HOME EXERCISE PROGRAM: TBA  ASSESSMENT:  CLINICAL IMPRESSION: Reduction in knee pain by 2-3 NPRS at end of session.  Pt extensive edu on what to expect with rehab post TKR. She continues to be in the planning phase setting up PT.  Overall pain contingent on days between aquatic therapy but overall she reports a reduction.  Bilat knee extension visually full with overpressure.  Goals ongoing     Initial impression:  Patient is a 63 y.o. f who was seen today for physical therapy evaluation and treatment for Bilateral primary osteoarthritis of knee . She presents today with gross bilateral knee valgus deformities impairing gait using a SPC. She is preparing for left TKR at the end of June and will likely follow with right before end of year.  She is a high fall risk due to balance deficits, she does have some muscle weakness and demonstrates difficulty with transfers due to progressed OA.  Her goals are to improve her toleration to activity and eventually return to walking up toward 2 miles.  She is a good candidate for aquatic therapy intervention to improve strength, maintain/progress bilat knee ROM, improve balance and safety with amb and prepare for upcoming surgical procedure. Will plan to conider land based interventionas therapy progresses.  OBJECTIVE IMPAIRMENTS: Abnormal gait, decreased  balance, decreased endurance, decreased mobility, difficulty walking, decreased ROM, decreased strength, obesity, and pain.   ACTIVITY LIMITATIONS: carrying, lifting, bending, sitting, standing, squatting, sleeping, stairs, transfers, and locomotion level  PARTICIPATION LIMITATIONS: meal prep, cleaning, laundry, shopping, and community activity  PERSONAL FACTORS: see PmHx are also affecting patient's functional outcome.   REHAB POTENTIAL: Good  CLINICAL DECISION MAKING: Stable/uncomplicated  EVALUATION COMPLEXITY: Low   GOALS: Goals reviewed with patient? Yes  SHORT TERM GOALS: Target date: 01/20/24 Pt will tolerate full aquatic sessions consistently without increase in pain and with improving function to demonstrate good toleration and effectiveness of intervention.  Baseline: Goal status: MET - 01/07/24  2.  Pt will have initial aquatic HEP and will complete indep at personal pool access Baseline:  Goal status: INITIAL  3.  Pt will report 0/10 pain in knees with submerged activity Baseline:  Goal status: MET - 01/07/24  4.  Pt will tolerate 10+ minutes of continuous amb in upright position submerged to demonstrate improved toleration to amb Baseline: leaning forward on grocery cart.(Eval) Goal status: MET- 01/23/24    LONG TERM GOALS: Target date: 02/27/24  Pt to improve on LEFS by at least 9 point to demonstrate statistically significant Improvement in function. Baseline:33/80  Goal status: INITIAL  2.  Pt will improve on Tug test to <or= 15s to demonstrate improvement in lower extremity function, mobility and decreased fall risk. Baseline:22.17  Goal status: INITIAL  3.  Pt will perform SLS in 3.6 ft unsupported x 20s to demonstrate improved balance ability Baseline:  Goal status: INITIAL  4.  Pt will be indep with final HEP's (land and aquatic as appropriate) for continued management of condition Baseline:  Goal status: INITIAL  5.  Pt will report decrease in pain  by at least 50% for improved toleration to activity/quality of life and to demonstrate improved management of pain. Baseline:  Goal status: INITIAL     PLAN:  PT FREQUENCY: 1-2x/week  PT DURATION: 8 weeks (extended due to scheduling conflicts)  PLANNED INTERVENTIONS: 16109- PT Re-evaluation, 97110-Therapeutic exercises, 97530- Therapeutic activity, 97112- Neuromuscular re-education, 97535- Self Care, 60454- Manual therapy, [phone]- Gait training, [phone]- Aquatic Therapy, [phone]- Electrical stimulation (unattended), [phone]- Ionotophoresis 4mg /ml Dexamethasone , Patient/Family education, Balance training, Stair training, Taping, Dry Needling, Joint mobilization, DME instructions, Cryotherapy, and Moist heat  PLAN FOR NEXT SESSION: aquatics: LE stretching and strengthening, balance retraining, proprioceptive retraining, pain management, gait tolerance   Lucinda Saber) Nakeshia Waldeck MPT 01/28/24 5:16 PM [facility] [address] [place], [place], [address] Phone: [phone]   Fax:  [phone]      Referring diagnosis? M17.0 (ICD-10-CM) - Bilateral primary osteoarthritis of knee  Treatment diagnosis? (if different than referring diagnosis) no What was this (referring dx) caused by? []  Surgery []  Fall []  Ongoing issue [x]  Arthritis []  Other: ____________  Laterality: []  Rt []  Lt [x]  Both  Check all possible CPT codes:  *CHOOSE 10 OR LESS*    See Planned Interventions listed in the Plan section of the Evaluation.

## 2024-01-30 ENCOUNTER — Encounter (HOSPITAL_BASED_OUTPATIENT_CLINIC_OR_DEPARTMENT_OTHER): Payer: Self-pay | Admitting: Physical Therapy

## 2024-01-30 ENCOUNTER — Ambulatory Visit (HOSPITAL_BASED_OUTPATIENT_CLINIC_OR_DEPARTMENT_OTHER): Admitting: Physical Therapy

## 2024-01-30 DIAGNOSIS — M25561 Pain in right knee: Secondary | ICD-10-CM | POA: Diagnosis not present

## 2024-01-30 DIAGNOSIS — G8929 Other chronic pain: Secondary | ICD-10-CM

## 2024-01-30 DIAGNOSIS — M6281 Muscle weakness (generalized): Secondary | ICD-10-CM

## 2024-01-30 DIAGNOSIS — R293 Abnormal posture: Secondary | ICD-10-CM

## 2024-01-30 DIAGNOSIS — R269 Unspecified abnormalities of gait and mobility: Secondary | ICD-10-CM

## 2024-01-30 NOTE — Therapy (Addendum)
 OUTPATIENT PHYSICAL THERAPY LOWER EXTREMITY TREATMENT Progress Note Reporting Period 12/30/23 to 01/30/24  See note below for Objective Data and Assessment of Progress/Goals.      Patient Name: Latasha [name] MRN: [id number] DOB:[birth year], 63 y.o., female Today's Date: 02/04/2024  END OF SESSION:   PT End of Session - 01/30/24 1151     Visit Number 10    Number of Visits 17   Date for PT Re-Evaluation 02/27/24    Authorization Type [employer] mcr    PT Start Time 1146    PT Stop Time 1230    PT Time Calculation (min) 44 min    Activity Tolerance Patient tolerated treatment well    Behavior During Therapy WFL for tasks assessed/performed                 Past Medical History:  Diagnosis Date   Anxiety    Arthritis    Chronic fatigue    Endometriosis    Family history of osteoporosis    GERD (gastroesophageal reflux disease)    Heart murmur    History of kidney stones    Hypertension    IBS (irritable bowel syndrome)    Obesity    Surgical menopause    Vaginal dryness    Past Surgical History:  Procedure Laterality Date   ABDOMINAL HYSTERECTOMY     tah/bso   TONSILLECTOMY  1976   TOTAL HIP ARTHROPLASTY Right 10/03/2021   Procedure: TOTAL HIP ARTHROPLASTY;  Surgeon: Arlyne Lame, MD;  Location: [facility] ORS;  Service: Orthopedics;  Laterality: Right;   Patient Active Problem List   Diagnosis Date Noted   Hx of total hip arthroplasty, right 10/03/2021   De Quervain's tenosynovitis, right 09/06/2020   BP (high blood pressure) 09/05/2015   Osteoporosis, post-menopausal 09/05/2015   Postsurgical menopause 09/05/2015   Class 1 obesity 09/05/2015   GERD (gastroesophageal reflux disease) 09/05/2015   Endometriosis 09/05/2015   Status post total hysterectomy and bilateral salpingo-oophorectomy 09/05/2015   Primary osteoarthritis of both knees 09/28/2014    PCP: [name] MD/  [name], PA-C    REFERRING PROVIDER: [name] MD  REFERRING  DIAG: M17.0 (ICD-10-CM) - Bilateral primary osteoarthritis of knee   THERAPY DIAG:  Chronic pain of both knees  Gait abnormality  Muscle weakness (generalized)  Abnormal posture  Rationale for Evaluation and Treatment: Rehabilitation  ONSET DATE: 10 yrs +  SUBJECTIVE:   SUBJECTIVE STATEMENT: 02/04/24 Pt reports taking OTC med prior to coming with low pain just tight   Initial subjective: Planned TKR June 30 by Dr [name].  I [facility] [employer] to get pool access.  Knee pain has begun "playing with my mobility".  I was a walker but am unable to go now.  Also have OA left shoulder may need total shoulder.  Had injection 1 week ago in shoulder has reduced pain.  Not going to do anything now focusing on knee.  PERTINENT HISTORY: Bilateral shoulder pain. 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 yes: NPRS scale: current 2/10, lower back, 2/10 L knee Pain location: see above  Pain description: ache Aggravating factors: walking, weight baring Relieving factors: rest  PRECAUTIONS: None  RED FLAGS: None   WEIGHT BEARING RESTRICTIONS: No  FALLS:  Has patient fallen in last 6 months? No  LIVING ENVIRONMENT: Lives with: lives with their family Lives in: House/apartment Stairs: 5 steps to exit home handrail Has following equipment at home: Single point cane  OCCUPATION: retired  PLOF: Requires assistive device for independence  PATIENT GOALS: get back to walking, dance at sons wedding in [place],  [place] MD VISIT:  2 weeks  OBJECTIVE:  Note: Objective measures were completed at Evaluation unless otherwise noted.  DIAGNOSTIC FINDINGS: OA bilat knee  PATIENT SURVEYS:  LEFS 33/80  COGNITION: Overall cognitive status: Within functional limits for tasks assessed     SENSATION: WFL  EDEMA:  Slight pitted edema +1 left ankle, slight diffuse edema throughout left knee   MUSCLE LENGTH: Hamstrings: wfl   POSTURE: rounded shoulders,  weight shift right, and bilat valgus deformities (left ~10d)  PALPATION: TTP about medial left knee joint line  LOWER EXTREMITY ROM:   10 degree left valgus deformity Active ROM Right eval Left eval R / L 01/30/24  Hip flexion     Hip extension     Hip abduction     Hip adduction     Hip internal rotation     Hip external rotation     Knee flexion 113 106 116 / 112  Knee extension 0 0   Ankle dorsiflexion     Ankle plantarflexion     Ankle inversion     Ankle eversion      (Blank rows = not tested)  LOWER EXTREMITY MMT:  HD lbs Right eval Left eval   Hip flexion 45.0 38.8   Hip extension     Hip abduction 32.5 34.1   Hip adduction     Hip internal rotation     Hip external rotation     Knee flexion     Knee extension 34.4 34.1   Ankle dorsiflexion     Ankle plantarflexion     Ankle inversion     Ankle eversion      (Blank rows = not tested)   FUNCTIONAL TESTS:  Timed up and go (TUG): 22.17 4 stage balance: passed 1&2; tandem x 8s; SLS x 2s  GAIT: Distance walked: 400 ft Assistive device utilized: Single point cane Level of assistance: Complete Independence Comments: Bilat knee valgus deformities; slight hip circumduction to allow for advancement of LE; knees knocking, increased lateral displacement Gait: decreased lateral displacement, less guarding due to pain                                                                                                                                TREATMENT  Progress note testing: TUG; ROM; edema addressed, gait  [facility] Adult PT Treatment:                                                DATE: 01/30/24 Pt seen for aquatic therapy today.  Treatment took place in water 3.5-4.75 ft in depth at the [facility] pool. Temp of water was 91.  Pt entered/exited  the pool via stairs in step-to pattern with bilat rail.  - unsupported walking forward/ backward/ side stepping  -forward marching with knee kicks x 2 widths  ue support RB HB.  Good control no unsteadiness or LOB -  UE on wall->RB HB:toe raises; heels  hip abdct/ addct crossing midline x 10; hamstring curls x 15 each; relaxed squats; leg swings -TrA sets using full hollow noodle wide stance then staggered x 10ea - at stairs, LLE on 2nd step, forward lunge <-> hamstring stretch  - cycling on noodle; hip add\abd  Pt requires the buoyancy and hydrostatic pressure of water for support, and to offload joints by unweighting joint load by at least 50 % in navel deep water and by at least 75-80% in chest to neck deep water.  Viscosity of the water is needed for resistance of strengthening. Water current perturbations provides challenge to standing balance requiring increased core activation.     PATIENT EDUCATION:  Education details: Intro to aquatic therapy Person educated: Patient Education method: Explanation Education comprehension: verbalized understanding  HOME EXERCISE PROGRAM: TBA  ASSESSMENT:  CLINICAL IMPRESSION: PN : Pt reports 40% improvement in overall pain, 90% on days she participates in aquatic therapy.  She has no edema throughout left or right LE and her gait is less antalgic/guarded. Bilaterally she has had an improvement in knee flex ROM.  She report she has more "tightness" in knees vs pain since onset of physical therapy. She continues to have surgical date set with some apprehensiveness but is determined to go through with.  She demonstrates improved dynamic standing balance without unsteadiness nor LOB.  Goals ongoing.  Will re-cert to extend visits from 12 to 17.  End of cert remains the same     Initial impression:  Patient is a 63 y.o. f who was seen today for physical therapy evaluation and treatment for Bilateral primary osteoarthritis of knee . She presents today with gross bilateral knee valgus deformities impairing gait using a SPC. She is preparing for left TKR at the end of June and will likely follow with right  before end of year.  She is a high fall risk due to balance deficits, she does have some muscle weakness and demonstrates difficulty with transfers due to progressed OA.  Her goals are to improve her toleration to activity and eventually return to walking up toward 2 miles.  She is a good candidate for aquatic therapy intervention to improve strength, maintain/progress bilat knee ROM, improve balance and safety with amb and prepare for upcoming surgical procedure. Will plan to conider land based interventionas therapy progresses.  OBJECTIVE IMPAIRMENTS: Abnormal gait, decreased balance, decreased endurance, decreased mobility, difficulty walking, decreased ROM, decreased strength, obesity, and pain.   ACTIVITY LIMITATIONS: carrying, lifting, bending, sitting, standing, squatting, sleeping, stairs, transfers, and locomotion level  PARTICIPATION LIMITATIONS: meal prep, cleaning, laundry, shopping, and community activity  PERSONAL FACTORS: see PmHx are also affecting patient's functional outcome.   REHAB POTENTIAL: Good  CLINICAL DECISION MAKING: Stable/uncomplicated  EVALUATION COMPLEXITY: Low   GOALS: Goals reviewed with patient? Yes  SHORT TERM GOALS: Target date: 01/20/24 Pt will tolerate full aquatic sessions consistently without increase in pain and with improving function to demonstrate good toleration and effectiveness of intervention.  Baseline: Goal status: MET - 01/07/24  2.  Pt will have initial aquatic HEP and will complete indep at personal pool access Baseline:  Goal status: INITIAL  3.  Pt will report 0/10 pain in knees with submerged activity Baseline:  Goal status: MET - 01/07/24  4.  Pt will tolerate 10+ minutes of continuous amb in upright position submerged to demonstrate improved toleration to amb Baseline: leaning forward on grocery cart.(Eval) Goal status: MET- 01/23/24    LONG TERM GOALS: Target date: 02/27/24  Pt to improve on LEFS by at least 9 point to  demonstrate statistically significant Improvement in function. Baseline:33/80  Goal status: INITIAL  2.  Pt will improve on Tug test to <or= 15s to demonstrate improvement in lower extremity function, mobility and decreased fall risk. Baseline:22.17 ; 21.23 01/30/24 Goal status: In progress   3.  Pt will perform SLS in 3.6 ft unsupported x 20s to demonstrate improved balance ability Baseline:  Goal status: INITIAL  4.  Pt will be indep with final HEP's (land and aquatic as appropriate) for continued management of condition Baseline:  Goal status: INITIAL  5.  Pt will report decrease in pain by at least 50% for improved toleration to activity/quality of life and to demonstrate improved management of pain. Baseline: 40% 01/30/24 Goal status: In progress 01/30/24     PLAN:  PT FREQUENCY: 1-2x/week  PT DURATION: 8 weeks (extended due to scheduling conflicts) 17 visits  PLANNED INTERVENTIONS: 97164- PT Re-evaluation, 97110-Therapeutic exercises, 97530- Therapeutic activity, 97112- Neuromuscular re-education, 97535- Self Care, 57846- Manual therapy, [phone]- Gait training, [phone]- Aquatic Therapy, [phone]- Electrical stimulation (unattended), [phone]- Ionotophoresis 4mg /ml Dexamethasone , Patient/Family education, Balance training, Stair training, Taping, Dry Needling, Joint mobilization, DME instructions, Cryotherapy, and Moist heat  PLAN FOR NEXT SESSION: aquatics: LE stretching and strengthening, balance retraining, proprioceptive retraining, pain management, gait tolerance   Lucinda Saber) Kamorah Nevils MPT 02/04/24 11:59 AM [facility] [address] [place], [place], [address] Phone: [phone]   Fax:  [phone]   Addend Lucinda Saber) Johann Santone MPT 02/04/24 12:01 PM [facility] [address] [place], [place], [address] Phone: [phone]   Fax:  [phone]    Referring  diagnosis? M17.0 (ICD-10-CM) - Bilateral primary osteoarthritis of knee  Treatment diagnosis? (if different than referring diagnosis) no What was this (referring dx) caused by? []  Surgery []  Fall []  Ongoing issue [x]  Arthritis []  Other: ____________  Laterality: []  Rt []  Lt [x]  Both  Check all possible CPT codes:  *CHOOSE 10 OR LESS*    See Planned Interventions listed in the Plan section of the Evaluation.

## 2024-02-04 ENCOUNTER — Ambulatory Visit (HOSPITAL_BASED_OUTPATIENT_CLINIC_OR_DEPARTMENT_OTHER): Attending: Student | Admitting: Physical Therapy

## 2024-02-04 ENCOUNTER — Encounter (HOSPITAL_BASED_OUTPATIENT_CLINIC_OR_DEPARTMENT_OTHER): Payer: Self-pay | Admitting: Physical Therapy

## 2024-02-04 DIAGNOSIS — M25562 Pain in left knee: Secondary | ICD-10-CM | POA: Insufficient documentation

## 2024-02-04 DIAGNOSIS — M6281 Muscle weakness (generalized): Secondary | ICD-10-CM | POA: Insufficient documentation

## 2024-02-04 DIAGNOSIS — G8929 Other chronic pain: Secondary | ICD-10-CM | POA: Insufficient documentation

## 2024-02-04 DIAGNOSIS — R293 Abnormal posture: Secondary | ICD-10-CM | POA: Insufficient documentation

## 2024-02-04 DIAGNOSIS — R269 Unspecified abnormalities of gait and mobility: Secondary | ICD-10-CM | POA: Insufficient documentation

## 2024-02-04 DIAGNOSIS — M25561 Pain in right knee: Secondary | ICD-10-CM | POA: Insufficient documentation

## 2024-02-04 NOTE — Therapy (Signed)
 OUTPATIENT PHYSICAL THERAPY LOWER EXTREMITY TREATMENT       Patient Name: Latasha [name] MRN: [id number] DOB:07-30-1961, 63 y.o., female Today's Date: 02/04/2024  END OF SESSION:  PT End of Session - 02/04/24 1156     Visit Number 11    Number of Visits 17    Date for PT Re-Evaluation 02/27/24    Authorization Type [employer] m[employer]    PT Start Time 1148    PT Stop Time 1230    PT Time Calculation (min) 42 min    Activity Tolerance Patient tolerated treatment well    Behavior During Therapy WFL for tasks assessed/performed                  Past Medical History:  Diagnosis Date   Anxiety    Arthritis    Chronic fatigue    Endometriosis    Family history of osteoporosis    GERD (gastroesophageal reflux disease)    Heart murmur    History of kidney stones    Hypertension    IBS (irritable bowel syndrome)    Obesity    Surgical menopause    Vaginal dryness    Past Surgical History:  Procedure Laterality Date   ABDOMINAL HYSTERECTOMY     tah/bso   TONSILLECTOMY  1976   TOTAL HIP ARTHROPLASTY Right 10/03/2021   Procedure: TOTAL HIP ARTHROPLASTY;  Surgeon: Arlyne Lame, MD;  Location: [facility] ORS;  Service: Orthopedics;  Laterality: Right;   Patient Active Problem List   Diagnosis Date Noted   Hx of total hip arthroplasty, right 10/03/2021   De Quervain's tenosynovitis, right 09/06/2020   BP (high blood pressure) 09/05/2015   Osteoporosis, post-menopausal 09/05/2015   Postsurgical menopause 09/05/2015   Class 1 obesity 09/05/2015   GERD (gastroesophageal reflux disease) 09/05/2015   Endometriosis 09/05/2015   Status post total hysterectomy and bilateral salpingo-oophorectomy 09/05/2015   Primary osteoarthritis of both knees 09/28/2014    PCP: [name] MD/  [name], PA-C    REFERRING PROVIDER: [name] MD  REFERRING DIAG: M17.0 (ICD-10-CM) - Bilateral primary osteoarthritis of knee   THERAPY DIAG:  Chronic pain of both knees  Gait  abnormality  Muscle weakness (generalized)  Abnormal posture  Rationale for Evaluation and Treatment: Rehabilitation  ONSET DATE: 10 yrs +  SUBJECTIVE:   SUBJECTIVE STATEMENT: 02/04/24 Pt reports not sleeping well last 2 nights due to knee pain and reflux   Initial subjective: Planned TKR June 30 by Dr [name].  I [facility] [employer] to get [facility] access.  Knee pain has begun "playing with my mobility".  I was a walker but am unable to go now.  Also have OA left shoulder may need total shoulder.  Had injection 1 week ago in shoulder has reduced pain.  Not going to do anything now focusing on knee.  PERTINENT HISTORY: Bilateral shoulder pain. 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 yes: NPRS scale: current 4/10, lower back, 2/10 L knee Pain location: see above  Pain description: ache Aggravating factors: walking, weight baring Relieving factors: rest  PRECAUTIONS: None  RED FLAGS: None   WEIGHT BEARING RESTRICTIONS: No  FALLS:  Has patient fallen in last 6 months? No  LIVING ENVIRONMENT: Lives with: lives with their family Lives in: House/apartment Stairs: 5 steps to exit home handrail Has following equipment at home: Single point cane  OCCUPATION: retired  PLOF: Requires assistive device for independence  PATIENT GOALS: get back to  walking, dance at sons wedding in [place],  [place] MD VISIT:  2 weeks  OBJECTIVE:  Note: Objective measures were completed at Evaluation unless otherwise noted.  DIAGNOSTIC FINDINGS: OA bilat knee  PATIENT SURVEYS:  LEFS 33/80  COGNITION: Overall cognitive status: Within functional limits for tasks assessed     SENSATION: WFL  EDEMA:  Slight pitted edema +1 left ankle, slight diffuse edema throughout left knee   MUSCLE LENGTH: Hamstrings: wfl   POSTURE: rounded shoulders, weight shift right, and bilat valgus deformities (left ~10d)  PALPATION: TTP about medial left knee joint line  LOWER  EXTREMITY ROM:   10 degree left valgus deformity Active ROM Right eval Left eval R / L 01/30/24  Hip flexion     Hip extension     Hip abduction     Hip adduction     Hip internal rotation     Hip external rotation     Knee flexion 113 106 116 / 112  Knee extension 0 0   Ankle dorsiflexion     Ankle plantarflexion     Ankle inversion     Ankle eversion      (Blank rows = not tested)  LOWER EXTREMITY MMT:  HD lbs Right eval Left eval   Hip flexion 45.0 38.8   Hip extension     Hip abduction 32.5 34.1   Hip adduction     Hip internal rotation     Hip external rotation     Knee flexion     Knee extension 34.4 34.1   Ankle dorsiflexion     Ankle plantarflexion     Ankle inversion     Ankle eversion      (Blank rows = not tested)   FUNCTIONAL TESTS:  Timed up and go (TUG): 22.17 4 stage balance: passed 1&2; tandem x 8s; SLS x 2s  GAIT: Distance walked: 400 ft Assistive device utilized: Single point cane Level of assistance: Complete Independence Comments: Bilat knee valgus deformities; slight hip circumduction to allow for advancement of LE; knees knocking, increased lateral displacement Gait: decreased lateral displacement, less guarding due to pain                                                                                                                                TREATMENT  Progress note testing: TUG; ROM; edema addressed, gait  [facility] Adult PT Treatment:                                                DATE: 02/04/24 Pt seen for aquatic therapy today.  Treatment took place in water 3.5-4.75 ft in depth at the [facility] pool. Temp of water was 91.  Pt entered/exited the pool via stairs in step-to pattern with bilat rail.  - unsupported walking forward/  backward/ side stepping  -side stepping ue add/abd yellow HB -forward marching with knee kicks x 4widths ue support RB HB.  Good control no unsteadiness or LOB -  UE on RB HB:toe raises; heels   hip abdct/ addct crossing midline x 15; hamstring curls x 15 each; relaxed squats; leg swings -STS from bench onto water step x 10-> from 3rd step unsupported, min-cga x 5 - cycling on noodle; hip add\abd *walking between exercises for recovery  Pt requires the buoyancy and hydrostatic pressure of water for support, and to offload joints by unweighting joint load by at least 50 % in navel deep water and by at least 75-80% in chest to neck deep water.  Viscosity of the water is needed for resistance of strengthening. Water current perturbations provides challenge to standing balance requiring increased core activation.     PATIENT EDUCATION:  Education details: Intro to aquatic therapy Person educated: Patient Education method: Explanation Education comprehension: verbalized understanding  HOME EXERCISE PROGRAM: TBA  ASSESSMENT:  CLINICAL IMPRESSION: Pain slightly elevated after 2 restless nights. MD appt Friday. Pt edu on etiology of knees popping to reduce apprehension.  She reports right knee pooped and seemed to lock the other day.  She does report she feels her right leg is getting stronger "between my panty line and knee".  Good progression with strengthening and balance/proprioceptive retraining.  PN : Pt reports 40% improvement in overall pain, 90% on days she participates in aquatic therapy.  She has no edema throughout left or right LE and her gait is less antalgic/guarded. Bilaterally she has had an improvement in knee flex ROM.  She report she has more "tightness" in knees vs pain since onset of physical therapy. She continues to have surgical date set with some apprehensiveness but is determined to go through with.  She demonstrates improved dynamic standing balance without unsteadiness nor LOB.  Goals ongoing   OBJECTIVE IMPAIRMENTS: Abnormal gait, decreased balance, decreased endurance, decreased mobility, difficulty walking, decreased ROM, decreased strength, obesity, and  pain.   ACTIVITY LIMITATIONS: carrying, lifting, bending, sitting, standing, squatting, sleeping, stairs, transfers, and locomotion level  PARTICIPATION LIMITATIONS: meal prep, cleaning, laundry, shopping, and community activity  PERSONAL FACTORS: see PmHx are also affecting patient's functional outcome.   REHAB POTENTIAL: Good  CLINICAL DECISION MAKING: Stable/uncomplicated  EVALUATION COMPLEXITY: Low   GOALS: Goals reviewed with patient? Yes  SHORT TERM GOALS: Target date: 01/20/24 Pt will tolerate full aquatic sessions consistently without increase in pain and with improving function to demonstrate good toleration and effectiveness of intervention.  Baseline: Goal status: MET - 01/07/24  2.  Pt will have initial aquatic HEP and will complete indep at personal pool access Baseline:  Goal status: INITIAL  3.  Pt will report 0/10 pain in knees with submerged activity Baseline:  Goal status: MET - 01/07/24  4.  Pt will tolerate 10+ minutes of continuous amb in upright position submerged to demonstrate improved toleration to amb Baseline: leaning forward on grocery cart.(Eval) Goal status: MET- 01/23/24    LONG TERM GOALS: Target date: 02/27/24  Pt to improve on LEFS by at least 9 point to demonstrate statistically significant Improvement in function. Baseline:33/80  Goal status: INITIAL  2.  Pt will improve on Tug test to <or= 15s to demonstrate improvement in lower extremity function, mobility and decreased fall risk. Baseline:22.17 ; 21.23 01/30/24 Goal status: In progress   3.  Pt will perform SLS in 3.6 ft unsupported x 20s to demonstrate improved balance  ability Baseline:  Goal status: INITIAL  4.  Pt will be indep with final HEP's (land and aquatic as appropriate) for continued management of condition Baseline:  Goal status: INITIAL  5.  Pt will report decrease in pain by at least 50% for improved toleration to activity/quality of life and to demonstrate improved  management of pain. Baseline: 40% 01/30/24 Goal status: In progress 01/30/24     PLAN:  PT FREQUENCY: 1-2x/week  PT DURATION: 8 weeks (extended due to scheduling conflicts)  PLANNED INTERVENTIONS: 78295- PT Re-evaluation, 97110-Therapeutic exercises, 97530- Therapeutic activity, 97112- Neuromuscular re-education, 97535- Self Care, 62130- Manual therapy, [phone]- Gait training, [phone]- Aquatic Therapy, [phone]- Electrical stimulation (unattended), [phone]- Ionotophoresis 4mg /ml Dexamethasone , Patient/Family education, Balance training, Stair training, Taping, Dry Needling, Joint mobilization, DME instructions, Cryotherapy, and Moist heat  PLAN FOR NEXT SESSION: aquatics: LE stretching and strengthening, balance retraining, proprioceptive retraining, pain management, gait tolerance   Lucinda Saber) Worthy Boschert MPT 02/04/24 12:06 PM [facility] [address] [place], [place], [address] Phone: [phone]   Fax:  [phone]      Referring diagnosis? M17.0 (ICD-10-CM) - Bilateral primary osteoarthritis of knee  Treatment diagnosis? (if different than referring diagnosis) no What was this (referring dx) caused by? []  Surgery []  Fall []  Ongoing issue [x]  Arthritis []  Other: ____________  Laterality: []  Rt []  Lt [x]  Both  Check all possible CPT codes:  *CHOOSE 10 OR LESS*    See Planned Interventions listed in the Plan section of the Evaluation.

## 2024-02-04 NOTE — Addendum Note (Signed)
 Addended by: Chidi Shirer F on: 02/04/2024 12:05 PM   Modules accepted: Orders

## 2024-02-06 ENCOUNTER — Ambulatory Visit (HOSPITAL_BASED_OUTPATIENT_CLINIC_OR_DEPARTMENT_OTHER): Admitting: Physical Therapy

## 2024-02-06 ENCOUNTER — Encounter (HOSPITAL_BASED_OUTPATIENT_CLINIC_OR_DEPARTMENT_OTHER): Payer: Self-pay | Admitting: Physical Therapy

## 2024-02-06 DIAGNOSIS — R293 Abnormal posture: Secondary | ICD-10-CM | POA: Diagnosis present

## 2024-02-06 DIAGNOSIS — M6281 Muscle weakness (generalized): Secondary | ICD-10-CM

## 2024-02-06 DIAGNOSIS — M25561 Pain in right knee: Secondary | ICD-10-CM | POA: Diagnosis present

## 2024-02-06 DIAGNOSIS — R269 Unspecified abnormalities of gait and mobility: Secondary | ICD-10-CM | POA: Diagnosis present

## 2024-02-06 DIAGNOSIS — G8929 Other chronic pain: Secondary | ICD-10-CM | POA: Diagnosis present

## 2024-02-06 DIAGNOSIS — M25562 Pain in left knee: Secondary | ICD-10-CM | POA: Diagnosis present

## 2024-02-06 NOTE — Therapy (Signed)
 OUTPATIENT PHYSICAL THERAPY LOWER EXTREMITY TREATMENT    Patient Name: Latasha [name] MRN: [id number] DOB:06-05-1961, 63 y.o., female Today's Date: 02/06/2024  END OF SESSION:  PT End of Session - 02/06/24 1152     Visit Number 12    Number of Visits 17    Date for PT Re-Evaluation 02/27/24    Authorization Type [employer] mcr    PT Start Time 1149    PT Stop Time 1227    PT Time Calculation (min) 38 min    Behavior During Therapy WFL for tasks assessed/performed                  Past Medical History:  Diagnosis Date   Anxiety    Arthritis    Chronic fatigue    Endometriosis    Family history of osteoporosis    GERD (gastroesophageal reflux disease)    Heart murmur    History of kidney stones    Hypertension    IBS (irritable bowel syndrome)    Obesity    Surgical menopause    Vaginal dryness    Past Surgical History:  Procedure Laterality Date   ABDOMINAL HYSTERECTOMY     tah/bso   TONSILLECTOMY  1976   TOTAL HIP ARTHROPLASTY Right 10/03/2021   Procedure: TOTAL HIP ARTHROPLASTY;  Surgeon: Arlyne Lame, MD;  Location: [facility] ORS;  Service: Orthopedics;  Laterality: Right;   Patient Active Problem List   Diagnosis Date Noted   Hx of total hip arthroplasty, right 10/03/2021   De Quervain's tenosynovitis, right 09/06/2020   BP (high blood pressure) 09/05/2015   Osteoporosis, post-menopausal 09/05/2015   Postsurgical menopause 09/05/2015   Class 1 obesity 09/05/2015   GERD (gastroesophageal reflux disease) 09/05/2015   Endometriosis 09/05/2015   Status post total hysterectomy and bilateral salpingo-oophorectomy 09/05/2015   Primary osteoarthritis of both knees 09/28/2014    PCP: [name] MD/  [name], PA-C    REFERRING PROVIDER: [name] MD  REFERRING DIAG: M17.0 (ICD-10-CM) - Bilateral primary osteoarthritis of knee   THERAPY DIAG:  Chronic pain of both knees  Gait abnormality  Muscle weakness (generalized)  Rationale for  Evaluation and Treatment: Rehabilitation  ONSET DATE: 10 yrs +  SUBJECTIVE:   SUBJECTIVE STATEMENT: 02/06/24  "Yesterday was a pretty good day, but today I'm so stiff".   POOL ACCESS: currently none. Unsure about access since will be having surgery soon.   Initial subjective: Planned TKR June 30 by Dr [name].  I [facility] up [employer] to get pool access.  Knee pain has begun "playing with my mobility".  I was a walker but am unable to go now.  Also have OA left shoulder may need total shoulder.  Had injection 1 week ago in shoulder has reduced pain.  Not going to do anything now focusing on knee.  PERTINENT HISTORY: Bilateral shoulder pain. 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 yes: NPRS scale: current 5/10 bilat knees to feet Pain location: see above  Pain description: ache, stiff Aggravating factors: walking, weight baring Relieving factors: rest  PRECAUTIONS: None  RED FLAGS: None   WEIGHT BEARING RESTRICTIONS: No  FALLS:  Has patient fallen in last 6 months? No  LIVING ENVIRONMENT: Lives with: lives with their family Lives in: House/apartment Stairs: 5 steps to exit home handrail Has following equipment at home: Single point cane  OCCUPATION: retired  PLOF: Requires assistive device for independence  PATIENT GOALS: get back to walking, dance  at sons wedding in [place],  [place] MD VISIT:  2 weeks  OBJECTIVE:  Note: Objective measures were completed at Evaluation unless otherwise noted.  DIAGNOSTIC FINDINGS: OA bilat knee  PATIENT SURVEYS:  LEFS 33/80  COGNITION: Overall cognitive status: Within functional limits for tasks assessed     SENSATION: WFL  EDEMA:  Slight pitted edema +1 left ankle, slight diffuse edema throughout left knee   MUSCLE LENGTH: Hamstrings: wfl   POSTURE: rounded shoulders, weight shift right, and bilat valgus deformities (left ~10d)  PALPATION: TTP about medial left knee joint line  LOWER  EXTREMITY ROM:   10 degree left valgus deformity Active ROM Right eval Left eval R / L 01/30/24  Hip flexion     Hip extension     Hip abduction     Hip adduction     Hip internal rotation     Hip external rotation     Knee flexion 113 106 116 / 112  Knee extension 0 0   Ankle dorsiflexion     Ankle plantarflexion     Ankle inversion     Ankle eversion      (Blank rows = not tested)  LOWER EXTREMITY MMT:  HD lbs Right eval Left eval   Hip flexion 45.0 38.8   Hip extension     Hip abduction 32.5 34.1   Hip adduction     Hip internal rotation     Hip external rotation     Knee flexion     Knee extension 34.4 34.1   Ankle dorsiflexion     Ankle plantarflexion     Ankle inversion     Ankle eversion      (Blank rows = not tested)   FUNCTIONAL TESTS:  Timed up and go (TUG): 22.17 4 stage balance: passed 1&2; tandem x 8s; SLS x 2s  02/06/24: in 3ft6":  SLS - RLE x 20s, LLE 5s  GAIT: Distance walked: 400 ft Assistive device utilized: Single point cane Level of assistance: Complete Independence Comments: Bilat knee valgus deformities; slight hip circumduction to allow for advancement of LE; knees knocking, increased lateral displacement Gait: decreased lateral displacement, less guarding due to pain                                                                                                                                TREATMENT [facility] Adult PT Treatment:                                                DATE: 02/06/24 Pt seen for aquatic therapy today.  Treatment took place in water 3.5-4.75 ft in depth at the [facility] pool. Temp of water was 91.  Pt entered/exited the pool via stairs in step-to pattern with bilat rail.  - unsupported walking forward/  backward/ side stepping  -side stepping ue add/abd with arm addct/ abdct -backward marching/ forward walking with knee kicks  - SLS in 106ft6" (see above) - tandem stance in 63ft x 20s x 2, arms out to  steady -  UE on wall: hip abdct/ addct x 10 -UE on yellow hand floats:Heel raises x 10; toe raises x 10; alternating hamstring curls x 15 each; alternating single leg clams x 10 each  - at stairs, L/R hamstring stretch with foot on 2nd step, bilat gastroc stretch with heels off of first step -STS from 3rd step x 3 reps   Pt requires the buoyancy and hydrostatic pressure of water for support, and to offload joints by unweighting joint load by at least 50 % in navel deep water and by at least 75-80% in chest to neck deep water.  Viscosity of the water is needed for resistance of strengthening. Water current perturbations provides challenge to standing balance requiring increased core activation.     PATIENT EDUCATION:  Education details: Intro to aquatic therapy Person educated: Patient Education method: Explanation Education comprehension: verbalized understanding  HOME EXERCISE PROGRAM: TBA  ASSESSMENT:  CLINICAL IMPRESSION:  Continued relief in knees with aquatic exercise.  SLS in shallow water continues to be challenging, as well as tandem stance with LLE in back. Pt is progressing gradually towards remaining goals.   TKA scheduled at end of month.  Pt to check if visit limit - has 2 remaining visits in auth period.     OBJECTIVE IMPAIRMENTS: Abnormal gait, decreased balance, decreased endurance, decreased mobility, difficulty walking, decreased ROM, decreased strength, obesity, and pain.   ACTIVITY LIMITATIONS: carrying, lifting, bending, sitting, standing, squatting, sleeping, stairs, transfers, and locomotion level  PARTICIPATION LIMITATIONS: meal prep, cleaning, laundry, shopping, and community activity  PERSONAL FACTORS: see PmHx are also affecting patient's functional outcome.   REHAB POTENTIAL: Good  CLINICAL DECISION MAKING: Stable/uncomplicated  EVALUATION COMPLEXITY: Low   GOALS: Goals reviewed with patient? Yes  SHORT TERM GOALS: Target date: 01/20/24 Pt will  tolerate full aquatic sessions consistently without increase in pain and with improving function to demonstrate good toleration and effectiveness of intervention.  Baseline: Goal status: MET - 01/07/24  2.  Pt will have initial aquatic HEP and will complete indep at personal pool access Baseline:  Goal status: IN PROGRESS   3.  Pt will report 0/10 pain in knees with submerged activity Baseline:  Goal status: MET - 01/07/24  4.  Pt will tolerate 10+ minutes of continuous amb in upright position submerged to demonstrate improved toleration to amb Baseline: leaning forward on grocery cart.(Eval) Goal status: MET- 01/23/24    LONG TERM GOALS: Target date: 02/27/24  Pt to improve on LEFS by at least 9 point to demonstrate statistically significant Improvement in function. Baseline:33/80  Goal status: INITIAL  2.  Pt will improve on Tug test to <or= 15s to demonstrate improvement in lower extremity function, mobility and decreased fall risk. Baseline:22.17 ; 21.23 01/30/24 Goal status: In progress   3.  Pt will perform SLS in 3.6 ft unsupported x 20s to demonstrate improved balance ability Baseline: see above Goal status: IN progress - 02/06/24  4.  Pt will be indep with final HEP's (land and aquatic as appropriate) for continued management of condition Baseline:  Goal status: INITIAL  5.  Pt will report decrease in pain by at least 50% for improved toleration to activity/quality of life and to demonstrate improved management of pain. Baseline: 40% 01/30/24 Goal  status: In progress 01/30/24     PLAN:  PT FREQUENCY: 1-2x/week  PT DURATION: 8 weeks (extended due to scheduling conflicts)  PLANNED INTERVENTIONS: 16109- PT Re-evaluation, 97110-Therapeutic exercises, 97530- Therapeutic activity, 97112- Neuromuscular re-education, 97535- Self Care, 60454- Manual therapy, [phone]- Gait training, [phone]- Aquatic Therapy, [phone]- Electrical stimulation (unattended), [phone]- Ionotophoresis 4mg /ml  Dexamethasone , Patient/Family education, Balance training, Stair training, Taping, Dry Needling, Joint mobilization, DME instructions, Cryotherapy, and Moist heat  PLAN FOR NEXT SESSION: aquatics: LE stretching and strengthening, balance retraining, proprioceptive retraining, pain management, gait tolerance  Almedia Jacobsen, PTA 02/06/24 4:22 PM [facility] [address] [place], [place], [address] Phone: [phone]   Fax:  [phone]     Referring diagnosis? M17.0 (ICD-10-CM) - Bilateral primary osteoarthritis of knee  Treatment diagnosis? (if different than referring diagnosis) no What was this (referring dx) caused by? []  Surgery []  Fall []  Ongoing issue [x]  Arthritis []  Other: ____________  Laterality: []  Rt []  Lt [x]  Both  Check all possible CPT codes:  *CHOOSE 10 OR LESS*    See Planned Interventions listed in the Plan section of the Evaluation.

## 2024-02-09 ENCOUNTER — Ambulatory Visit (HOSPITAL_BASED_OUTPATIENT_CLINIC_OR_DEPARTMENT_OTHER): Admitting: Physical Therapy

## 2024-02-09 ENCOUNTER — Encounter (HOSPITAL_BASED_OUTPATIENT_CLINIC_OR_DEPARTMENT_OTHER): Payer: Self-pay | Admitting: Physical Therapy

## 2024-02-09 DIAGNOSIS — G8929 Other chronic pain: Secondary | ICD-10-CM

## 2024-02-09 DIAGNOSIS — R269 Unspecified abnormalities of gait and mobility: Secondary | ICD-10-CM

## 2024-02-09 DIAGNOSIS — M25561 Pain in right knee: Secondary | ICD-10-CM | POA: Diagnosis not present

## 2024-02-09 DIAGNOSIS — M6281 Muscle weakness (generalized): Secondary | ICD-10-CM

## 2024-02-09 NOTE — Therapy (Signed)
 OUTPATIENT PHYSICAL THERAPY LOWER EXTREMITY TREATMENT    Patient Name: Latasha [name] MRN: [id number] DOB:[birth year], 63 y.o., female Today's Date: 02/09/2024  END OF SESSION:  PT End of Session - 02/09/24 1132     Visit Number 13    Number of Visits 17    Date for PT Re-Evaluation 02/27/24    Authorization Type [employer] m[employer]    PT Start Time 1101    PT Stop Time 1145    PT Time Calculation (min) 44 min    Activity Tolerance Patient tolerated treatment well    Behavior During Therapy WFL for tasks assessed/performed                   Past Medical History:  Diagnosis Date   Anxiety    Arthritis    Chronic fatigue    Endometriosis    Family history of osteoporosis    GERD (gastroesophageal reflux disease)    Heart murmur    History of kidney stones    Hypertension    IBS (irritable bowel syndrome)    Obesity    Surgical menopause    Vaginal dryness    Past Surgical History:  Procedure Laterality Date   ABDOMINAL HYSTERECTOMY     tah/bso   TONSILLECTOMY  1976   TOTAL HIP ARTHROPLASTY Right 10/03/2021   Procedure: TOTAL HIP ARTHROPLASTY;  Surgeon: Arlyne Lame, MD;  Location: [facility] ORS;  Service: Orthopedics;  Laterality: Right;   Patient Active Problem List   Diagnosis Date Noted   Hx of total hip arthroplasty, right 10/03/2021   De Quervain's tenosynovitis, right 09/06/2020   BP (high blood pressure) 09/05/2015   Osteoporosis, post-menopausal 09/05/2015   Postsurgical menopause 09/05/2015   Class 1 obesity 09/05/2015   GERD (gastroesophageal reflux disease) 09/05/2015   Endometriosis 09/05/2015   Status post total hysterectomy and bilateral salpingo-oophorectomy 09/05/2015   Primary osteoarthritis of both knees 09/28/2014    PCP: [name] MD/  [name], PA-C    REFERRING PROVIDER: [name] MD  REFERRING DIAG: M17.0 (ICD-10-CM) - Bilateral primary osteoarthritis of knee   THERAPY DIAG:  Chronic pain of both knees  Gait  abnormality  Muscle weakness (generalized)  Rationale for Evaluation and Treatment: Rehabilitation  ONSET DATE: 10 yrs +  SUBJECTIVE:   SUBJECTIVE STATEMENT: 02/09/24  "Saw Dr [name]. He will write an order for me to come here after my surgery".   POOL ACCESS: currently none. Unsure about access since will be having surgery soon.   Initial subjective: Planned TKR June 30 by Dr [name].  I [facility] up [employer] to get pool access.  Knee pain has begun "playing with my mobility".  I was a walker but am unable to go now.  Also have OA left shoulder may need total shoulder.  Had injection 1 week ago in shoulder has reduced pain.  Not going to do anything now focusing on knee.  PERTINENT HISTORY: Bilateral shoulder pain. 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 yes: NPRS scale: current 5/10 bilat knees to feet Pain location: see above  Pain description: ache, stiff Aggravating factors: walking, weight baring Relieving factors: rest  PRECAUTIONS: None  RED FLAGS: None   WEIGHT BEARING RESTRICTIONS: No  FALLS:  Has patient fallen in last 6 months? No  LIVING ENVIRONMENT: Lives with: lives with their family Lives in: House/apartment Stairs: 5 steps to exit home handrail Has following equipment at home: Single point cane  OCCUPATION: retired  PLOF: Requires assistive device for independence  PATIENT GOALS: get back to walking, dance at sons wedding in [place],  NEXT MD VISIT:  2 weeks  OBJECTIVE:  Note: Objective measures were completed at Evaluation unless otherwise noted.  DIAGNOSTIC FINDINGS: OA bilat knee  PATIENT SURVEYS:  LEFS 33/80  COGNITION: Overall cognitive status: Within functional limits for tasks assessed     SENSATION: WFL  EDEMA:  Slight pitted edema +1 left ankle, slight diffuse edema throughout left knee   MUSCLE LENGTH: Hamstrings: wfl   POSTURE: rounded shoulders, weight shift right, and bilat valgus  deformities (left ~10d)  PALPATION: TTP about medial left knee joint line  LOWER EXTREMITY ROM:   10 degree left valgus deformity Active ROM Right eval Left eval R / L 01/30/24  Hip flexion     Hip extension     Hip abduction     Hip adduction     Hip internal rotation     Hip external rotation     Knee flexion 113 106 116 / 112  Knee extension 0 0   Ankle dorsiflexion     Ankle plantarflexion     Ankle inversion     Ankle eversion      (Blank rows = not tested)  LOWER EXTREMITY MMT:  HD lbs Right eval Left eval   Hip flexion 45.0 38.8   Hip extension     Hip abduction 32.5 34.1   Hip adduction     Hip internal rotation     Hip external rotation     Knee flexion     Knee extension 34.4 34.1   Ankle dorsiflexion     Ankle plantarflexion     Ankle inversion     Ankle eversion      (Blank rows = not tested)   FUNCTIONAL TESTS:  Timed up and go (TUG): 22.17 4 stage balance: passed 1&2; tandem x 8s; SLS x 2s  02/06/24: in 35ft6":  SLS - RLE x 20s, LLE 5s  GAIT: Distance walked: 400 ft Assistive device utilized: Single point cane Level of assistance: Complete Independence Comments: Bilat knee valgus deformities; slight hip circumduction to allow for advancement of LE; knees knocking, increased lateral displacement Gait: decreased lateral displacement, less guarding due to pain                                                                                                                                TREATMENT [facility] Adult PT Treatment:                                                DATE: 02/09/24  Pt edu/self care: Post surgery therapy home and OP; water vs land therapy for strengthening post surgery; equipment needed (walker and 3 in 1 commode, shower bench)  Pt  seen for aquatic therapy today.  Treatment took place in water 3.5-4.75 ft in depth at the [facility] pool. Temp of water was 91.  Pt entered/exited the pool via stairs in step-to pattern with  bilat rail.  - unsupported walking forward/ backward/ side stepping  -side stepping ue add/abd with arm addct/ abdct -backward marching/ forward walking with knee kicks  -UE on yellow hand floats:Heel raises x 10; toe raises x 10; alternating hamstring curls x 15 each ; hip add/abd; high knee marching; hip flex/ext (balance, proprioceptive retraining and strengthening) - SLS in 97ft6" unsupported - tandem stance in 17ft x 20s x 2, arms out to steady - at stairs, L/R hamstring stretch with foot on 2nd step, bilat gastroc stretch with heels off of first step   Pt requires the buoyancy and hydrostatic pressure of water for support, and to offload joints by unweighting joint load by at least 50 % in navel deep water and by at least 75-80% in chest to neck deep water.  Viscosity of the water is needed for resistance of strengthening. Water current perturbations provides challenge to standing balance requiring increased core activation.     PATIENT EDUCATION:  Education details: Intro to aquatic therapy Person educated: Patient Education method: Explanation Education comprehension: verbalized understanding  HOME EXERCISE PROGRAM: TBA  ASSESSMENT:  CLINICAL IMPRESSION:  Time spent today setting up schedule for post surgery as well as explaining to pt procedure after TKR.  She will have Dr [name] send referral here for post op.  Pt with improved balance as evidenced through SLS and tandem in 3.6 ft unsupported x 20s or >.  Pain reduction with intervention in bilateral knees. Goals ongoing.     OBJECTIVE IMPAIRMENTS: Abnormal gait, decreased balance, decreased endurance, decreased mobility, difficulty walking, decreased ROM, decreased strength, obesity, and pain.   ACTIVITY LIMITATIONS: carrying, lifting, bending, sitting, standing, squatting, sleeping, stairs, transfers, and locomotion level  PARTICIPATION LIMITATIONS: meal prep, cleaning, laundry, shopping, and community  activity  PERSONAL FACTORS: see PmHx are also affecting patient's functional outcome.   REHAB POTENTIAL: Good  CLINICAL DECISION MAKING: Stable/uncomplicated  EVALUATION COMPLEXITY: Low   GOALS: Goals reviewed with patient? Yes  SHORT TERM GOALS: Target date: 01/20/24 Pt will tolerate full aquatic sessions consistently without increase in pain and with improving function to demonstrate good toleration and effectiveness of intervention.  Baseline: Goal status: MET - 01/07/24  2.  Pt will have initial aquatic HEP and will complete indep at personal pool access Baseline:  Goal status: IN PROGRESS   3.  Pt will report 0/10 pain in knees with submerged activity Baseline:  Goal status: MET - 01/07/24  4.  Pt will tolerate 10+ minutes of continuous amb in upright position submerged to demonstrate improved toleration to amb Baseline: leaning forward on grocery cart.(Eval) Goal status: MET- 01/23/24    LONG TERM GOALS: Target date: 02/27/24  Pt to improve on LEFS by at least 9 point to demonstrate statistically significant Improvement in function. Baseline:33/80  Goal status: INITIAL  2.  Pt will improve on Tug test to <or= 15s to demonstrate improvement in lower extremity function, mobility and decreased fall risk. Baseline:22.17 ; 21.23 01/30/24 Goal status: In progress   3.  Pt will perform SLS in 3.6 ft unsupported x 20s to demonstrate improved balance ability Baseline: see above Goal status: IN progress - 02/06/24  4.  Pt will be indep with final HEP's (land and aquatic as appropriate) for continued management of condition Baseline:  Goal status: INITIAL  5.  Pt will report decrease in pain by at least 50% for improved toleration to activity/quality of life and to demonstrate improved management of pain. Baseline: 40% 01/30/24 Goal status: In progress 01/30/24     PLAN:  PT FREQUENCY: 1-2x/week  PT DURATION: 8 weeks (extended due to scheduling conflicts)  PLANNED  INTERVENTIONS: 42595- PT Re-evaluation, 97110-Therapeutic exercises, 97530- Therapeutic activity, 97112- Neuromuscular re-education, 97535- Self Care, 63875- Manual therapy, [phone]- Gait training, [phone]- Aquatic Therapy, [phone]- Electrical stimulation (unattended), [phone]- Ionotophoresis 4mg /ml Dexamethasone , Patient/Family education, Balance training, Stair training, Taping, Dry Needling, Joint mobilization, DME instructions, Cryotherapy, and Moist heat  PLAN FOR NEXT SESSION: aquatics: LE stretching and strengthening, balance retraining, proprioceptive retraining, pain management, gait tolerance  Lucinda Saber) Daris Aristizabal MPT 02/09/24 11:51 AM [facility] [address] [place], [place], [address] Phone: [phone]   Fax:  [phone]      Referring diagnosis? M17.0 (ICD-10-CM) - Bilateral primary osteoarthritis of knee  Treatment diagnosis? (if different than referring diagnosis) no What was this (referring dx) caused by? []  Surgery []  Fall []  Ongoing issue [x]  Arthritis []  Other: ____________  Laterality: []  Rt []  Lt [x]  Both  Check all possible CPT codes:  *CHOOSE 10 OR LESS*    See Planned Interventions listed in the Plan section of the Evaluation.

## 2024-02-12 ENCOUNTER — Ambulatory Visit (HOSPITAL_BASED_OUTPATIENT_CLINIC_OR_DEPARTMENT_OTHER): Admitting: Physical Therapy

## 2024-02-12 ENCOUNTER — Encounter (HOSPITAL_BASED_OUTPATIENT_CLINIC_OR_DEPARTMENT_OTHER): Payer: Self-pay | Admitting: Physical Therapy

## 2024-02-12 DIAGNOSIS — R269 Unspecified abnormalities of gait and mobility: Secondary | ICD-10-CM

## 2024-02-12 DIAGNOSIS — G8929 Other chronic pain: Secondary | ICD-10-CM

## 2024-02-12 DIAGNOSIS — R293 Abnormal posture: Secondary | ICD-10-CM

## 2024-02-12 DIAGNOSIS — M6281 Muscle weakness (generalized): Secondary | ICD-10-CM

## 2024-02-12 DIAGNOSIS — M25561 Pain in right knee: Secondary | ICD-10-CM | POA: Diagnosis not present

## 2024-02-12 NOTE — Therapy (Signed)
 OUTPATIENT PHYSICAL THERAPY LOWER EXTREMITY TREATMENT    Patient Name: Latasha [name] MRN: [id number] DOB:Oct 31, 1960, 63 y.o., female Today's Date: 02/12/2024  END OF SESSION:  PT End of Session - 02/12/24 1227     Visit Number 14    Number of Visits 17    Date for PT Re-Evaluation 02/27/24    Authorization Type [employer] m[employer]    PT Start Time 1155    PT Stop Time 1235    PT Time Calculation (min) 40 min    Activity Tolerance Patient tolerated treatment well    Behavior During Therapy WFL for tasks assessed/performed                Past Medical History:  Diagnosis Date   Anxiety    Arthritis    Chronic fatigue    Endometriosis    Family history of osteoporosis    GERD (gastroesophageal reflux disease)    Heart murmur    History of kidney stones    Hypertension    IBS (irritable bowel syndrome)    Obesity    Surgical menopause    Vaginal dryness    Past Surgical History:  Procedure Laterality Date   ABDOMINAL HYSTERECTOMY     tah/bso   TONSILLECTOMY  1976   TOTAL HIP ARTHROPLASTY Right 10/03/2021   Procedure: TOTAL HIP ARTHROPLASTY;  Surgeon: Arlyne Lame, MD;  Location: [facility] ORS;  Service: Orthopedics;  Laterality: Right;   Patient Active Problem List   Diagnosis Date Noted   Hx of total hip arthroplasty, right 10/03/2021   De Quervain's tenosynovitis, right 09/06/2020   BP (high blood pressure) 09/05/2015   Osteoporosis, post-menopausal 09/05/2015   Postsurgical menopause 09/05/2015   Class 1 obesity 09/05/2015   GERD (gastroesophageal reflux disease) 09/05/2015   Endometriosis 09/05/2015   Status post total hysterectomy and bilateral salpingo-oophorectomy 09/05/2015   Primary osteoarthritis of both knees 09/28/2014    PCP: [name] MD/  [name], PA-C    REFERRING PROVIDER: [name] MD  REFERRING DIAG: M17.0 (ICD-10-CM) - Bilateral primary osteoarthritis of knee   THERAPY DIAG:  Chronic pain of both knees  Gait  abnormality  Muscle weakness (generalized)  Abnormal posture  Rationale for Evaluation and Treatment: Rehabilitation  ONSET DATE: 10 yrs +  SUBJECTIVE:   SUBJECTIVE STATEMENT: 02/12/24  My left knee is hurting me more today than it ever has.  I have backed off of my celebrex  as they want me to ween off.   POOL ACCESS: currently none. Unsure about access since will be having surgery soon.   Initial subjective: Planned TKR June 30 by Dr [name].  I signed up [facility] [employer] to get pool access.  Knee pain has begun playing with my mobility.  I was a walker but am unable to go now.  Also have OA left shoulder may need total shoulder.  Had injection 1 week ago in shoulder has reduced pain.  Not going to do anything now focusing on knee.  PERTINENT HISTORY: Bilateral shoulder pain. 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 yes: NPRS scale: current 8/10 bilat knees to feet Pain location: see above  Pain description: ache, stiff Aggravating factors: walking, weight baring Relieving factors: rest  PRECAUTIONS: None  RED FLAGS: None   WEIGHT BEARING RESTRICTIONS: No  FALLS:  Has patient fallen in last 6 months? No  LIVING ENVIRONMENT: Lives with: lives with their family Lives in: House/apartment Stairs: 5 steps to exit home handrail  Has following equipment at home: Single point cane  OCCUPATION: retired  PLOF: Requires assistive device for independence  PATIENT GOALS: get back to walking, dance at sons wedding in [place],  [place] MD VISIT:  2 weeks  OBJECTIVE:  Note: Objective measures were completed at Evaluation unless otherwise noted.  DIAGNOSTIC FINDINGS: OA bilat knee  PATIENT SURVEYS:  LEFS 33/80  COGNITION: Overall cognitive status: Within functional limits for tasks assessed     SENSATION: WFL  EDEMA:  Slight pitted edema +1 left ankle, slight diffuse edema throughout left knee   MUSCLE LENGTH: Hamstrings: wfl   POSTURE:  rounded shoulders, weight shift right, and bilat valgus deformities (left ~10d)  PALPATION: TTP about medial left knee joint line  LOWER EXTREMITY ROM:   10 degree left valgus deformity Active ROM Right eval Left eval R / L 01/30/24  Hip flexion     Hip extension     Hip abduction     Hip adduction     Hip internal rotation     Hip external rotation     Knee flexion 113 106 116 / 112  Knee extension 0 0   Ankle dorsiflexion     Ankle plantarflexion     Ankle inversion     Ankle eversion      (Blank rows = not tested)  LOWER EXTREMITY MMT:  HD lbs Right eval Left eval   Hip flexion 45.0 38.8   Hip extension     Hip abduction 32.5 34.1   Hip adduction     Hip internal rotation     Hip external rotation     Knee flexion     Knee extension 34.4 34.1   Ankle dorsiflexion     Ankle plantarflexion     Ankle inversion     Ankle eversion      (Blank rows = not tested)   FUNCTIONAL TESTS:  Timed up and go (TUG): 22.17 4 stage balance: passed 1&2; tandem x 8s; SLS x 2s  02/06/24: in 16ft6:  SLS - RLE x 20s, LLE 5s  GAIT: Distance walked: 400 ft Assistive device utilized: Single point cane Level of assistance: Complete Independence Comments: Bilat knee valgus deformities; slight hip circumduction to allow for advancement of LE; knees knocking, increased lateral displacement Gait: decreased lateral displacement, less guarding due to pain                                                                                                                                TREATMENT [facility] Adult PT Treatment:                                                DATE: 02/12/24 Pt seen for aquatic therapy today.  Treatment took place in water 3.5-4.75 ft in depth at the  [facility] pool. Temp of water was 91.  Pt entered/exited the pool via stairs in step-to pattern with bilat rail.  - unsupported walking forward/ backward/ side stepping  -side stepping ue add/abd with arm addct/  abdct -backward marching/ forward walking with knee kicks  - at stairs, L/R hamstring and quad stretch with foot on 2nd step, bilat gastroc stretch with heels off of first step -cycling on noodle.  Ue support wall hip add/abd; hip flex/ext   Pt requires the buoyancy and hydrostatic pressure of water for support, and to offload joints by unweighting joint load by at least 50 % in navel deep water and by at least 75-80% in chest to neck deep water.  Viscosity of the water is needed for resistance of strengthening. Water current perturbations provides challenge to standing balance requiring increased core activation.     PATIENT EDUCATION:  Education details: Intro to aquatic therapy Person educated: Patient Education method: Explanation Education comprehension: verbalized understanding  HOME EXERCISE PROGRAM: TBA  ASSESSMENT:  CLINICAL IMPRESSION:  Increased knee pain L>R likely due to reduction in Nsaid as per Md request prior to surgery and upcoming colonoscopy.  Her toleration to session reduced but she does report increased flexibility/looseness of knees post intervention.  Left hamstring particularly painful with stretching.  Goals ongoing as pt benefiting form aquatic therapy intervention progressing toward land based goals.  Time spent today setting up schedule for post surgery as well as explaining to pt procedure after TKR.  She will have Dr [name] send referral here for post op.  Pt with improved balance as evidenced through SLS and tandem in 3.6 ft unsupported x 20s or >.  Pain reduction with intervention in bilateral knees. Goals ongoing.     OBJECTIVE IMPAIRMENTS: Abnormal gait, decreased balance, decreased endurance, decreased mobility, difficulty walking, decreased ROM, decreased strength, obesity, and pain.   ACTIVITY LIMITATIONS: carrying, lifting, bending, sitting, standing, squatting, sleeping, stairs, transfers, and locomotion level  PARTICIPATION LIMITATIONS: meal  prep, cleaning, laundry, shopping, and community activity  PERSONAL FACTORS: see PmHx are also affecting patient's functional outcome.   REHAB POTENTIAL: Good  CLINICAL DECISION MAKING: Stable/uncomplicated  EVALUATION COMPLEXITY: Low   GOALS: Goals reviewed with patient? Yes  SHORT TERM GOALS: Target date: 01/20/24 Pt will tolerate full aquatic sessions consistently without increase in pain and with improving function to demonstrate good toleration and effectiveness of intervention.  Baseline: Goal status: MET - 01/07/24  2.  Pt will have initial aquatic HEP and will complete indep at personal pool access Baseline:  Goal status: IN PROGRESS   3.  Pt will report 0/10 pain in knees with submerged activity Baseline:  Goal status: MET - 01/07/24  4.  Pt will tolerate 10+ minutes of continuous amb in upright position submerged to demonstrate improved toleration to amb Baseline: leaning forward on grocery cart.(Eval) Goal status: MET- 01/23/24    LONG TERM GOALS: Target date: 02/27/24  Pt to improve on LEFS by at least 9 point to demonstrate statistically significant Improvement in function. Baseline:33/80  Goal status: INITIAL  2.  Pt will improve on Tug test to <or= 15s to demonstrate improvement in lower extremity function, mobility and decreased fall risk. Baseline:22.17 ; 21.23 01/30/24 Goal status: In progress   3.  Pt will perform SLS in 3.6 ft unsupported x 20s to demonstrate improved balance ability Baseline: see above Goal status: IN progress - 02/06/24  4.  Pt will be indep with final HEP's (land and aquatic as appropriate) for  continued management of condition Baseline:  Goal status: INITIAL  5.  Pt will report decrease in pain by at least 50% for improved toleration to activity/quality of life and to demonstrate improved management of pain. Baseline: 40% 01/30/24 Goal status: In progress 01/30/24     PLAN:  PT FREQUENCY: 1-2x/week  PT DURATION: 8 weeks  (extended due to scheduling conflicts)  PLANNED INTERVENTIONS: 40981- PT Re-evaluation, 97110-Therapeutic exercises, 97530- Therapeutic activity, 97112- Neuromuscular re-education, 97535- Self Care, 19147- Manual therapy, [phone]- Gait training, [phone]- Aquatic Therapy, [phone]- Electrical stimulation (unattended), [phone]- Ionotophoresis 4mg /ml Dexamethasone , Patient/Family education, Balance training, Stair training, Taping, Dry Needling, Joint mobilization, DME instructions, Cryotherapy, and Moist heat  PLAN FOR NEXT SESSION: aquatics: LE stretching and strengthening, balance retraining, proprioceptive retraining, pain management, gait tolerance  Lucinda Saber) Hymen Arnett MPT 02/12/24 12:31 PM [facility] [address] [place], [place], [address] Phone: [phone]   Fax:  [phone]      Referring diagnosis? M17.0 (ICD-10-CM) - Bilateral primary osteoarthritis of knee  Treatment diagnosis? (if different than referring diagnosis) no What was this (referring dx) caused by? []  Surgery []  Fall []  Ongoing issue [x]  Arthritis []  Other: ____________  Laterality: []  Rt []  Lt [x]  Both  Check all possible CPT codes:  *CHOOSE 10 OR LESS*    See Planned Interventions listed in the Plan section of the Evaluation.

## 2024-02-15 NOTE — Discharge Instructions (Signed)
 Instructions after Total Knee Replacement   Emmilia Sowder P. Angie Fava., M.D.    Dept. of Orthopaedics & Sports Medicine [facility] [address] [place], [place]  [address]  Phone: [phone]   Fax: [phone]       [email or web address]       DIET: Drink plenty of non-alcoholic fluids. Resume your normal diet. Include foods high in fiber.  ACTIVITY:  You may use crutches or a walker with weight-bearing as tolerated, unless instructed otherwise. You may be weaned off of the walker or crutches by your Physical Therapist.  Do NOT place pillows under the knee. Anything placed under the knee could limit your ability to straighten the knee.   Use the Bone Foam 3 times a day for 30 minutes each session to help straighten the knee. Continue doing gentle exercises. Exercising will reduce the pain and swelling, increase motion, and prevent muscle weakness.   Please continue to use the TED compression stockings for 6 weeks. You may remove the stockings at night, but should reapply them in the morning. Do not drive or operate any equipment until instructed.  WOUND CARE:  The initial dressing (Aquacel) can remain in place for 7 days (see separate instructions). Continue to use the PolarCare or ice packs periodically to reduce pain and swelling. You may bathe or shower after the staples are removed at the first office visit following surgery.  MEDICATIONS: You may resume your regular medications. Please take the pain medication as prescribed on the medication. Do not take pain medication on an empty stomach. Unless instructed otherwise, you should take an enteric-coated aspirin 81 mg. TWICE a day. (This along with elevation will help reduce the possibility of blood clots/phlebitis in your operated leg.) Use a stool softener (such as Senokot-S or Colace) daily and a laxative (such as Miralax or Dulcolax) as needed to prevent constipation.  Do not drive or drink alcoholic beverages when  taking pain medications.  CALL THE OFFICE FOR: Temperature above 101 degrees Excessive bleeding or drainage on the dressing. Excessive swelling, coldness, or paleness of the toes. Persistent nausea and vomiting.  FOLLOW-UP:  You should have an appointment to return to the office in 10-14 days after surgery. Arrangements have been made for continuation of Physical Therapy (either home therapy or outpatient therapy).     [facility] Department Directory         [email or web address]       [email or web address]          Cardiology  Appointments: [place] - [phone]  Endocrinology  Appointments: [place] [phone] [place] - [phone]  Gastroenterology  Appointments: [employer] [phone] [place] - [phone]        General Surgery   Appointments: [facility]  Internal Medicine/Family Medicine  Appointments: [facility] [place] - [phone] [place] - [phone]  Metabolic and Weigh Loss Surgery  Appointments: [facility]        Neurology  Appointments: [place] [phone] [place] - [phone]  Neurosurgery  Appointments: [place]  Obstetrics & Gynecology  Appointments: [place] [phone] [place] - [phone]        Pediatrics  Appointments: [place] [phone] [place] - [phone]  Physiatry  Appointments: [place] [phone]  Physical Therapy  Appointments: [place] - [phone]        Podiatry  Appointments: [place] [phone] [place] - [phone]  Pulmonology  Appointments: [facility]  Rheumatology  Appointments: [place] [phone]  [place] Location: [facility]  [address] [place], [place]  [address]  [place] Location: [facility]. [address] [place], [place]  [address]  [place]  Location: [facility] [address] [place], [place]  [address]

## 2024-02-16 ENCOUNTER — Ambulatory Visit (HOSPITAL_BASED_OUTPATIENT_CLINIC_OR_DEPARTMENT_OTHER): Admitting: Physical Therapy

## 2024-02-16 ENCOUNTER — Encounter (HOSPITAL_BASED_OUTPATIENT_CLINIC_OR_DEPARTMENT_OTHER): Payer: Self-pay | Admitting: Physical Therapy

## 2024-02-16 DIAGNOSIS — G8929 Other chronic pain: Secondary | ICD-10-CM

## 2024-02-16 DIAGNOSIS — M25561 Pain in right knee: Secondary | ICD-10-CM | POA: Diagnosis not present

## 2024-02-16 DIAGNOSIS — M6281 Muscle weakness (generalized): Secondary | ICD-10-CM

## 2024-02-16 DIAGNOSIS — R293 Abnormal posture: Secondary | ICD-10-CM

## 2024-02-16 DIAGNOSIS — R269 Unspecified abnormalities of gait and mobility: Secondary | ICD-10-CM

## 2024-02-16 NOTE — Therapy (Signed)
 OUTPATIENT PHYSICAL THERAPY LOWER EXTREMITY TREATMENT    Patient Name: Latasha [name] MRN: [id number] DOB:November 17, 1960, 63 y.o., female Today's Date: 02/16/2024  END OF SESSION:  PT End of Session - 02/16/24 1211     Visit Number 15    Number of Visits 17    Date for PT Re-Evaluation 02/27/24    Authorization Type [employer] m[employer]    PT Start Time 1145    PT Stop Time 1225    PT Time Calculation (min) 40 min    Activity Tolerance Patient tolerated treatment well    Behavior During Therapy WFL for tasks assessed/performed                 Past Medical History:  Diagnosis Date   Anxiety    Arthritis    Chronic fatigue    Endometriosis    Family history of osteoporosis    GERD (gastroesophageal reflux disease)    Heart murmur    History of kidney stones    Hypertension    IBS (irritable bowel syndrome)    Obesity    Surgical menopause    Vaginal dryness    Past Surgical History:  Procedure Laterality Date   ABDOMINAL HYSTERECTOMY     [place]/bso   TONSILLECTOMY  1976   TOTAL HIP ARTHROPLASTY Right 10/03/2021   Procedure: TOTAL HIP ARTHROPLASTY;  Surgeon: Arlyne Lame, MD;  Location: [facility] ORS;  Service: Orthopedics;  Laterality: Right;   Patient Active Problem List   Diagnosis Date Noted   Hx of total hip arthroplasty, right 10/03/2021   De Quervain's tenosynovitis, right 09/06/2020   BP (high blood pressure) 09/05/2015   Osteoporosis, post-menopausal 09/05/2015   Postsurgical menopause 09/05/2015   Class 1 obesity 09/05/2015   GERD (gastroesophageal reflux disease) 09/05/2015   Endometriosis 09/05/2015   Status post total hysterectomy and bilateral salpingo-oophorectomy 09/05/2015   Primary osteoarthritis of both knees 09/28/2014    PCP: [name] MD/  [name], PA-C    REFERRING PROVIDER: [name] MD  REFERRING DIAG: M17.0 (ICD-10-CM) - Bilateral primary osteoarthritis of knee   THERAPY DIAG:  Chronic pain of both knees  Gait  abnormality  Muscle weakness (generalized)  Abnormal posture  Rationale for Evaluation and Treatment: Rehabilitation  ONSET DATE: 10 yrs +  SUBJECTIVE:   SUBJECTIVE STATEMENT: 02/16/24  Pt reports that she stopped celebrex  3 days ago for colonoscopy on Wednesday.  Has been using Tylenol  Arthritis for relief.  Pain is high today.   POOL ACCESS: currently none. Unsure about access since will be having surgery soon.   Initial subjective: Planned TKR June 30 by Dr [name].  I signed up [employer] to get pool access.  Knee pain has begun playing with my mobility.  I was a walker but am unable to go now.  Also have OA left shoulder may need total shoulder.  Had injection 1 week ago in shoulder has reduced pain.  Not going to do anything now focusing on knee.  PERTINENT HISTORY: Bilateral shoulder pain. 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 yes: NPRS scale: current 8/10 bilat knees to feet Pain location: see above  Pain description: ache, stiff Aggravating factors: walking, weight baring Relieving factors: rest  PRECAUTIONS: None  RED FLAGS: None   WEIGHT BEARING RESTRICTIONS: No  FALLS:  Has patient fallen in last 6 months? No  LIVING ENVIRONMENT: Lives with: lives with their family Lives in: House/apartment Stairs: 5 steps to exit home handrail  Has following equipment at home: Single point cane  OCCUPATION: retired  PLOF: Requires assistive device for independence  PATIENT GOALS: get back to walking, dance at sons wedding in [place],  [place] MD VISIT:  2 weeks  OBJECTIVE:  Note: Objective measures were completed at Evaluation unless otherwise noted.  DIAGNOSTIC FINDINGS: OA bilat knee  PATIENT SURVEYS:  LEFS 33/80  COGNITION: Overall cognitive status: Within functional limits for tasks assessed     SENSATION: WFL  EDEMA:  Slight pitted edema +1 left ankle, slight diffuse edema throughout left knee   MUSCLE  LENGTH: Hamstrings: wfl   POSTURE: rounded shoulders, weight shift right, and bilat valgus deformities (left ~10d)  PALPATION: TTP about medial left knee joint line  LOWER EXTREMITY ROM:   10 degree left valgus deformity Active ROM Right eval Left eval R / L 01/30/24  Hip flexion     Hip extension     Hip abduction     Hip adduction     Hip internal rotation     Hip external rotation     Knee flexion 113 106 116 / 112  Knee extension 0 0   Ankle dorsiflexion     Ankle plantarflexion     Ankle inversion     Ankle eversion      (Blank rows = not tested)  LOWER EXTREMITY MMT:  HD lbs Right eval Left eval   Hip flexion 45.0 38.8   Hip extension     Hip abduction 32.5 34.1   Hip adduction     Hip internal rotation     Hip external rotation     Knee flexion     Knee extension 34.4 34.1   Ankle dorsiflexion     Ankle plantarflexion     Ankle inversion     Ankle eversion      (Blank rows = not tested)   FUNCTIONAL TESTS:  Timed up and go (TUG): 22.17 4 stage balance: passed 1&2; tandem x 8s; SLS x 2s  02/06/24: in 59ft6:  SLS - RLE x 20s, LLE 5s  GAIT: Distance walked: 400 ft Assistive device utilized: Single point cane Level of assistance: Complete Independence Comments: Bilat knee valgus deformities; slight hip circumduction to allow for advancement of LE; knees knocking, increased lateral displacement Gait: decreased lateral displacement, less guarding due to pain                                                                                                                                TREATMENT [facility] Adult PT Treatment:                                                DATE: 02/16/24 Pt seen for aquatic therapy today.  Treatment took place in water 3.5-4.75 ft in depth at the  [facility] pool. Temp of water was 91.  Pt entered/exited the pool via stairs in step-to pattern with bilat rail.  - unsupported walking forward/ backward/ side stepping   -side stepping ue add/abd with rainbow hand floats - suitcase carry with bilat rainbow hand floats under water, marching backward  -UE on wall: hip abdct/ addct x 10 (alternating); heel raises x 10; LE swings into hip flex/ext x 10 each - return to walking forward/ backward - straddling noodle, with relaxed arms at side:  cycling; hip add/abd; hip flex/ext - at stairs, L/R hamstring and quad stretch with foot on 2nd step, bilat gastroc stretch with heels off of first step - return to walking    Pt requires the buoyancy and hydrostatic pressure of water for support, and to offload joints by unweighting joint load by at least 50 % in navel deep water and by at least 75-80% in chest to neck deep water.  Viscosity of the water is needed for resistance of strengthening. Water current perturbations provides challenge to standing balance requiring increased core activation.     PATIENT EDUCATION:  Education details: Intro to aquatic therapy Person educated: Patient Education method: Explanation Education comprehension: verbalized understanding  HOME EXERCISE PROGRAM: TBA  ASSESSMENT:  CLINICAL IMPRESSION: Gradual reduction of pain in knees to 1-2/10. Goals ongoing as pt benefiting form aquatic therapy intervention progressing toward land based goals. Pt has partially met her goals.     OBJECTIVE IMPAIRMENTS: Abnormal gait, decreased balance, decreased endurance, decreased mobility, difficulty walking, decreased ROM, decreased strength, obesity, and pain.   ACTIVITY LIMITATIONS: carrying, lifting, bending, sitting, standing, squatting, sleeping, stairs, transfers, and locomotion level  PARTICIPATION LIMITATIONS: meal prep, cleaning, laundry, shopping, and community activity  PERSONAL FACTORS: see PmHx are also affecting patient's functional outcome.   REHAB POTENTIAL: Good  CLINICAL DECISION MAKING: Stable/uncomplicated  EVALUATION COMPLEXITY: Low   GOALS: Goals reviewed with  patient? Yes  SHORT TERM GOALS: Target date: 01/20/24 Pt will tolerate full aquatic sessions consistently without increase in pain and with improving function to demonstrate good toleration and effectiveness of intervention.  Baseline: Goal status: MET - 01/07/24  2.  Pt will have initial aquatic HEP and will complete indep at personal pool access Baseline: no pool access yet- 02/16/24 Goal status: IN PROGRESS   3.  Pt will report 0/10 pain in knees with submerged activity Baseline:  Goal status: MET - 01/07/24  4.  Pt will tolerate 10+ minutes of continuous amb in upright position submerged to demonstrate improved toleration to amb Baseline: leaning forward on grocery cart.(Eval) Goal status: MET- 01/23/24    LONG TERM GOALS: Target date: 02/27/24  Pt to improve on LEFS by at least 9 point to demonstrate statistically significant Improvement in function. Baseline:33/80  Goal status: INITIAL  2.  Pt will improve on Tug test to <or= 15s to demonstrate improvement in lower extremity function, mobility and decreased fall risk. Baseline:22.17 ; 21.23 01/30/24 Goal status: In progress   3.  Pt will perform SLS in 3.6 ft unsupported x 20s to demonstrate improved balance ability Baseline: see above Goal status: IN progress - 02/06/24  4.  Pt will be indep with final HEP's (land and aquatic as appropriate) for continued management of condition Baseline:  Goal status: INITIAL  5.  Pt will report decrease in pain by at least 50% for improved toleration to activity/quality of life and to demonstrate improved management of pain. Baseline: 40% 01/30/24 Goal status: In progress 01/30/24  PLAN:  PT FREQUENCY: 1-2x/week  PT DURATION: 8 weeks (extended due to scheduling conflicts)  PLANNED INTERVENTIONS: 52841- PT Re-evaluation, 97110-Therapeutic exercises, 97530- Therapeutic activity, 97112- Neuromuscular re-education, 97535- Self Care, 32440- Manual therapy, [phone]- Gait training, [phone]-  Aquatic Therapy, [phone]- Electrical stimulation (unattended), [phone]- Ionotophoresis 4mg /ml Dexamethasone , Patient/Family education, Balance training, Stair training, Taping, Dry Needling, Joint mobilization, DME instructions, Cryotherapy, and Moist heat  PLAN FOR NEXT SESSION: aquatics: LE stretching and strengthening, balance retraining, proprioceptive retraining, pain management, gait tolerance  Almedia Jacobsen, PTA 02/16/24 12:42 PM [facility] [address] [place], [place], [address] Phone: [phone]   Fax:  [phone]       Referring diagnosis? M17.0 (ICD-10-CM) - Bilateral primary osteoarthritis of knee  Treatment diagnosis? (if different than referring diagnosis) no What was this (referring dx) caused by? []  Surgery []  Fall []  Ongoing issue [x]  Arthritis []  Other: ____________  Laterality: []  Rt []  Lt [x]  Both  Check all possible CPT codes:  *CHOOSE 10 OR LESS*    See Planned Interventions listed in the Plan section of the Evaluation.

## 2024-02-18 ENCOUNTER — Ambulatory Visit: Payer: Self-pay

## 2024-02-18 DIAGNOSIS — R1310 Dysphagia, unspecified: Secondary | ICD-10-CM | POA: Diagnosis not present

## 2024-02-18 DIAGNOSIS — K209 Esophagitis, unspecified without bleeding: Secondary | ICD-10-CM | POA: Diagnosis not present

## 2024-02-18 DIAGNOSIS — K635 Polyp of colon: Secondary | ICD-10-CM

## 2024-02-18 DIAGNOSIS — D123 Benign neoplasm of transverse colon: Secondary | ICD-10-CM | POA: Diagnosis not present

## 2024-02-18 DIAGNOSIS — Z1211 Encounter for screening for malignant neoplasm of colon: Secondary | ICD-10-CM | POA: Diagnosis not present

## 2024-02-18 DIAGNOSIS — Z800 Family history of malignant neoplasm of digestive organs: Secondary | ICD-10-CM | POA: Diagnosis not present

## 2024-02-18 DIAGNOSIS — D122 Benign neoplasm of ascending colon: Secondary | ICD-10-CM | POA: Diagnosis not present

## 2024-02-18 DIAGNOSIS — K5730 Diverticulosis of large intestine without perforation or abscess without bleeding: Secondary | ICD-10-CM

## 2024-02-18 DIAGNOSIS — D128 Benign neoplasm of rectum: Secondary | ICD-10-CM

## 2024-02-18 DIAGNOSIS — D125 Benign neoplasm of sigmoid colon: Secondary | ICD-10-CM | POA: Diagnosis not present

## 2024-02-19 ENCOUNTER — Other Ambulatory Visit: Payer: Self-pay

## 2024-02-19 ENCOUNTER — Encounter
Admission: RE | Admit: 2024-02-19 | Discharge: 2024-02-19 | Disposition: A | Discharge status: Home / Self Care | Source: Ambulatory Visit | Attending: Orthopedic Surgery | Admitting: Orthopedic Surgery

## 2024-02-19 ENCOUNTER — Ambulatory Visit (HOSPITAL_BASED_OUTPATIENT_CLINIC_OR_DEPARTMENT_OTHER): Admitting: Physical Therapy

## 2024-02-19 VITALS — BP 154/84 | Resp 16 | Ht 63.0 in | Wt 212.1 lb

## 2024-02-19 DIAGNOSIS — Z01812 Encounter for preprocedural laboratory examination: Secondary | ICD-10-CM | POA: Insufficient documentation

## 2024-02-19 DIAGNOSIS — M1712 Unilateral primary osteoarthritis, left knee: Secondary | ICD-10-CM | POA: Insufficient documentation

## 2024-02-19 HISTORY — DX: Anemia, unspecified: D64.9

## 2024-02-19 HISTORY — DX: Unspecified asthma, uncomplicated: J45.909

## 2024-02-19 LAB — SURGICAL PCR SCREEN
MRSA, PCR: NEGATIVE
Staphylococcus aureus: NEGATIVE

## 2024-02-19 NOTE — Patient Instructions (Addendum)
 Your procedure is scheduled on: 03/01/24 - Monday Report to the Registration Desk on the 1st floor of the [facility]. To find out your arrival time, please call [phone] between 1PM - 3PM on: 02/27/24 - Friday If your arrival time is 6:00 am, do not arrive before that time as the [facility] entrance doors do not open until 6:00 am.  REMEMBER: Instructions that are not followed completely may result in serious medical risk, up to and including death; or upon the discretion of your surgeon and anesthesiologist your surgery may need to be rescheduled.  Do not eat food after midnight the night before surgery.  No gum chewing or hard candies.  You may however, drink CLEAR liquids up to 2 hours before you are scheduled to arrive for your surgery. Do not drink anything within 2 hours of your scheduled arrival time.  Clear liquids include: - water  - apple juice without pulp - gatorade (not RED colors) - black coffee or tea (Do NOT add milk or creamers to the coffee or tea) Do NOT drink anything that is not on this list.  In addition, your doctor has ordered for you to drink the provided:  Ensure Pre-Surgery Clear Carbohydrate Drink  Drinking this carbohydrate drink up to two hours before surgery helps to reduce insulin resistance and improve patient outcomes. Please complete drinking 2 hours before scheduled arrival time.  One week prior to surgery: Stop on 02/23/24, Anti-inflammatories (NSAIDS) such as CBD Cream, diclofenac (VOLTAREN) ,Advil, Aleve, Ibuprofen, Motrin, Naproxen, Naprosyn and Aspirin based products such as Excedrin, Goody's Powder, BC Powder. You may take Tylenol  if needed for pain up until the day of surgery.  Stop taking on 02/23/24, ANY OVER THE COUNTER supplements until after surgery : Vitamin C, Multivitamin, Glucosamine .  Continue taking all of your other prescription medications up until the day before surgery.  ON THE DAY OF SURGERY ONLY TAKE THESE  MEDICATIONS WITH SIPS OF WATER:  pantoprazole  (PROTONIX )    No Alcohol for 24 hours before or after surgery.  No Smoking including e-cigarettes for 24 hours before surgery.  No chewable tobacco products for at least 6 hours before surgery.  No nicotine patches on the day of surgery.  Do not use any recreational drugs for at least a week (preferably 2 weeks) before your surgery.  Please be advised that the combination of cocaine and anesthesia may have negative outcomes, up to and including death. If you test positive for cocaine, your surgery will be cancelled.  On the morning of surgery brush your teeth with toothpaste and water, you may rinse your mouth with mouthwash if you wish. Do not swallow any toothpaste or mouthwash.  Use CHG Soap or wipes as directed on instruction sheet.  Do not wear jewelry, make-up, hairpins, clips or nail polish.  For welded (permanent) jewelry: bracelets, anklets, waist bands, etc.  Please have this removed prior to surgery.  If it is not removed, there is a chance that hospital personnel will need to cut it off on the day of surgery.  Do not wear lotions, powders, or perfumes.   Do not shave body hair from the neck down 48 hours before surgery.  Contact lenses, hearing aids and dentures may not be worn into surgery.  Do not bring valuables to the hospital. [facility] is not responsible for any missing/lost belongings or valuables.   Notify your doctor if there is any change in your medical condition (cold, fever, infection).  Wear  comfortable clothing (specific to your surgery type) to the hospital.  After surgery, you can help prevent lung complications by doing breathing exercises.  Take deep breaths and cough every 1-2 hours. Your doctor may order a device called an Incentive Spirometer to help you take deep breaths.  When coughing or sneezing, hold a pillow firmly against your incision with both hands. This is called "splinting." Doing  this helps protect your incision. It also decreases belly discomfort.  If you are being admitted to the hospital overnight, leave your suitcase in the car. After surgery it may be brought to your room.  In case of increased patient census, it may be necessary for you, the patient, to continue your postoperative care in the Same Day Surgery department.  If you are being discharged the day of surgery, you will not be allowed to drive home. You will need a responsible individual to drive you home and stay with you for 24 hours after surgery.   If you are taking public transportation, you will need to have a responsible individual with you.  Please call the Pre-admissions Testing Dept. at [phone] if you have any questions about these instructions.  Surgery Visitation Policy:  Patients having surgery or a procedure may have two visitors.  Children under the age of 92 must have an adult with them who is not the patient.  Inpatient Visitation:    Visiting hours are 7 a.m. to 8 p.m. Up to four visitors are allowed at one time in a patient room. The visitors may rotate out with other people during the day.  One visitor age 81 or older may stay with the patient overnight and must be in the room by 8 p.m.     Pre-operative 5 CHG Bath Instructions   You can play a key role in reducing the risk of infection after surgery. Your skin needs to be as free of germs as possible. You can reduce the number of germs on your skin by washing with CHG (chlorhexidine  gluconate) soap before surgery. CHG is an antiseptic soap that kills germs and continues to kill germs even after washing.   DO NOT use if you have an allergy to chlorhexidine /CHG or antibacterial soaps. If your skin becomes reddened or irritated, stop using the CHG and notify one of our RNs at [phone].   Please shower with the CHG soap starting 4 days before surgery using the following schedule: 06/26 - 06/30.  Please keep in mind  the following:  DO NOT shave, including legs and underarms, starting the day of your first shower.   You may shave your face at any point before/day of surgery.  Place clean sheets on your bed the day you start using CHG soap. Use a clean washcloth (not used since being washed) for each shower. DO NOT sleep with pets once you start using the CHG.   CHG Shower Instructions:  If you choose to wash your hair and private area, wash first with your normal shampoo/soap.  After you use shampoo/soap, rinse your hair and body thoroughly to remove shampoo/soap residue.  Turn the water OFF and apply about 3 tablespoons (45 ml) of CHG soap to a CLEAN washcloth.  Apply CHG soap ONLY FROM YOUR NECK DOWN TO YOUR TOES (washing for 3-5 minutes)  DO NOT use CHG soap on face, private areas, open wounds, or sores.  Pay special attention to the area where your surgery is being performed.  If you are having back surgery,  having someone wash your back for you may be helpful. Wait 2 minutes after CHG soap is applied, then you may rinse off the CHG soap.  Pat dry with a clean towel  Put on clean clothes/pajamas   If you choose to wear lotion, please use ONLY the CHG-compatible lotions on the back of this paper.     Additional instructions for the day of surgery: DO NOT APPLY any lotions, deodorants, cologne, or perfumes.   Put on clean/comfortable clothes.  Brush your teeth.  Ask your nurse before applying any prescription medications to the skin.      CHG Compatible Lotions   Aveeno Moisturizing lotion  Cetaphil Moisturizing Cream  Cetaphil Moisturizing Lotion  Clairol Herbal Essence Moisturizing Lotion, Dry Skin  Clairol Herbal Essence Moisturizing Lotion, Extra Dry Skin  Clairol Herbal Essence Moisturizing Lotion, Normal Skin  Curel Age Defying Therapeutic Moisturizing Lotion with Alpha Hydroxy  Curel Extreme Care Body Lotion  Curel Soothing Hands Moisturizing Hand Lotion  Curel Therapeutic  Moisturizing Cream, Fragrance-Free  Curel Therapeutic Moisturizing Lotion, Fragrance-Free  Curel Therapeutic Moisturizing Lotion, Original Formula  Eucerin Daily Replenishing Lotion  Eucerin Dry Skin Therapy Plus Alpha Hydroxy Crme  Eucerin Dry Skin Therapy Plus Alpha Hydroxy Lotion  Eucerin Original Crme  Eucerin Original Lotion  Eucerin Plus Crme Eucerin Plus Lotion  Eucerin TriLipid Replenishing Lotion  Keri Anti-Bacterial Hand Lotion  Keri Deep Conditioning Original Lotion Dry Skin Formula Softly Scented  Keri Deep Conditioning Original Lotion, Fragrance Free Sensitive Skin Formula  Keri Lotion Fast Absorbing Fragrance Free Sensitive Skin Formula  Keri Lotion Fast Absorbing Softly Scented Dry Skin Formula  Keri Original Lotion  Keri Skin Renewal Lotion Keri Silky Smooth Lotion  Keri Silky Smooth Sensitive Skin Lotion  Nivea Body Creamy Conditioning Oil  Nivea Body Extra Enriched Lotion  Nivea Body Original Lotion  Nivea Body Sheer Moisturizing Lotion Nivea Crme  Nivea Skin Firming Lotion  NutraDerm 30 Skin Lotion  NutraDerm Skin Lotion  NutraDerm Therapeutic Skin Cream  NutraDerm Therapeutic Skin Lotion  ProShield Protective Hand Cream  Provon moisturizing lotion  How to Use an Incentive Spirometer  An incentive spirometer is a tool that measures how well you are filling your lungs with each breath. Learning to take long, deep breaths using this tool can help you keep your lungs clear and active. This may help to reverse or lessen your chance of developing breathing (pulmonary) problems, especially infection. You may be asked to use a spirometer: After a surgery. If you have a lung problem or a history of smoking. After a long period of time when you have been unable to move or be active. If the spirometer includes an indicator to show the highest number that you have reached, your health care provider or respiratory therapist will help you set a goal. Keep a log of your  progress as told by your health care provider. What are the risks? Breathing too quickly may cause dizziness or cause you to pass out. Take your time so you do not get dizzy or light-headed. If you are in pain, you may need to take pain medicine before doing incentive spirometry. It is harder to take a deep breath if you are having pain. How to use your incentive spirometer  Sit up on the edge of your bed or on a chair. Hold the incentive spirometer so that it is in an upright position. Before you use the spirometer, breathe out normally. Place the mouthpiece in your mouth. Make sure  your lips are closed tightly around it. Breathe in slowly and as deeply as you can through your mouth, causing the piston or the ball to rise toward the top of the chamber. Hold your breath for 3-5 seconds, or for as long as possible. If the spirometer includes a coach indicator, use this to guide you in breathing. Slow down your breathing if the indicator goes above the marked areas. Remove the mouthpiece from your mouth and breathe out normally. The piston or ball will return to the bottom of the chamber. Rest for a few seconds, then repeat the steps 10 or more times. Take your time and take a few normal breaths between deep breaths so that you do not get dizzy or light-headed. Do this every 1-2 hours when you are awake. If the spirometer includes a goal marker to show the highest number you have reached (best effort), use this as a goal to work toward during each repetition. After each set of 10 deep breaths, cough a few times. This will help to make sure that your lungs are clear. If you have an incision on your chest or abdomen from surgery, place a pillow or a rolled-up towel firmly against the incision when you cough. This can help to reduce pain while taking deep breaths and coughing. General tips When you are able to get out of bed: Walk around often. Continue to take deep breaths and cough in order to  clear your lungs. Keep using the incentive spirometer until your health care provider says it is okay to stop using it. If you have been in the hospital, you may be told to keep using the spirometer at home. Contact a health care provider if: You are having difficulty using the spirometer. You have trouble using the spirometer as often as instructed. Your pain medicine is not giving enough relief for you to use the spirometer as told. You have a fever. Get help right away if: You develop shortness of breath. You develop a cough with bloody mucus from the lungs. You have fluid or blood coming from an incision site after you cough. Summary An incentive spirometer is a tool that can help you learn to take long, deep breaths to keep your lungs clear and active. You may be asked to use a spirometer after a surgery, if you have a lung problem or a history of smoking, or if you have been inactive for a long period of time. Use your incentive spirometer as instructed every 1-2 hours while you are awake. If you have an incision on your chest or abdomen, place a pillow or a rolled-up towel firmly against your incision when you cough. This will help to reduce pain. Get help right away if you have shortness of breath, you cough up bloody mucus, or blood comes from your incision when you cough. This information is not intended to replace advice given to you by your health care provider. Make sure you discuss any questions you have with your health care provider. Document Revised: 11/08/2019 Document Reviewed: 11/08/2019 Elsevier Patient Education  2023 [employer].   POLAR CARE INFORMATION  MassAdvertisement.it  How to use [facility]?  YouTube   ShippingScam.co.uk  OPERATING INSTRUCTIONS  Start the product With dry hands, connect the transformer to the electrical connection located on the top of the cooler. Next, plug the transformer into an  appropriate electrical outlet. The unit will automatically start running at this point.  To stop  the pump, disconnect electrical power.  Unplug to stop the product when not in use. Unplugging the Polar Care unit turns it off. Always unplug immediately after use. Never leave it plugged in while unattended. Remove pad.    FIRST ADD WATER TO FILL LINE, THEN ICE---Replace ice when existing ice is almost melted  1 Discuss Treatment with your Licensed Health Care Practitioner and Use Only as Prescribed 2 Apply Insulation Barrier & Cold Therapy Pad 3 Check for Moisture 4 Inspect Skin Regularly  Tips and Trouble Shooting Usage Tips 1. Use cubed or chunked ice for optimal performance. 2. It is recommended to drain the Pad between uses. To drain the pad, hold the Pad upright with the hose pointed toward the ground. Depress the black plunger and allow water to drain out. 3. You may disconnect the Pad from the unit without removing the pad from the affected area by depressing the silver tabs on the hose coupling and gently pulling the hoses apart. The Pad and unit will seal itself and will not leak. Note: Some dripping during release is normal. 4. DO NOT RUN PUMP WITHOUT WATER! The pump in this unit is designed to run with water. Running the unit without water will cause permanent damage to the pump. 5. Unplug unit before removing lid.  TROUBLESHOOTING GUIDE Pump not running, Water not flowing to the pad, Pad is not getting cold 1. Make sure the transformer is plugged into the wall outlet. 2. Confirm that the ice and water are filled to the indicated levels. 3. Make sure there are no kinks in the pad. 4. Gently pull on the blue tube to make sure the tube/pad junction is straight. 5. Remove the pad from the treatment site and ll it while the pad is lying at; then reapply. 6. Confirm that the pad couplings are securely attached to the unit. Listen for the double clicks (Figure 1) to confirm the pad  couplings are securely attached.  Leaks    Note: Some condensation on the lines, controller, and pads is unavoidable, especially in warmer climates. 1. If using a [employer] Polar Care Cold Therapy unit with a detachable Cold Therapy Pad, and a leak exists (other than condensation on the lines) disconnect the pad couplings. Make sure the silver tabs on the couplings are depressed before reconnecting the pad to the pump hose; then confirm both sides of the coupling are properly clicked in. 2. If the coupling continues to leak or a leak is detected in the pad itself, stop using it and call [employer] Customer Care at [phone].  Cleaning After use, empty and dry the unit with a soft cloth. Warm water and mild detergent may be used occasionally to clean the pump and tubes.  WARNING: The Polar Care Cube can be cold enough to cause serious injury, including full skin necrosis. Follow these Operating Instructions, and carefully read the Product Insert (see pouch on side of unit) and the Cold Therapy Pad Fitting Instructions (provided with each Cold Therapy Pad) prior to use.  Preoperative Educational Videos for Total Hip, Knee and Shoulder Replacements  To better prepare for surgery, please view our videos that explain the physical activity and discharge planning required to have the best surgical recovery at [facility].  [email or web address]  Questions? Call [phone] or email [email or web address]

## 2024-02-20 ENCOUNTER — Ambulatory Visit (HOSPITAL_BASED_OUTPATIENT_CLINIC_OR_DEPARTMENT_OTHER): Payer: Self-pay | Admitting: Physical Therapy

## 2024-02-20 ENCOUNTER — Encounter (HOSPITAL_BASED_OUTPATIENT_CLINIC_OR_DEPARTMENT_OTHER): Payer: Self-pay | Admitting: Physical Therapy

## 2024-02-20 DIAGNOSIS — M25561 Pain in right knee: Secondary | ICD-10-CM | POA: Diagnosis not present

## 2024-02-20 DIAGNOSIS — R269 Unspecified abnormalities of gait and mobility: Secondary | ICD-10-CM

## 2024-02-20 DIAGNOSIS — G8929 Other chronic pain: Secondary | ICD-10-CM

## 2024-02-20 DIAGNOSIS — M6281 Muscle weakness (generalized): Secondary | ICD-10-CM

## 2024-02-20 DIAGNOSIS — R293 Abnormal posture: Secondary | ICD-10-CM

## 2024-02-20 NOTE — Therapy (Signed)
 OUTPATIENT PHYSICAL THERAPY LOWER EXTREMITY TREATMENT    Patient Name: Latasha [name] MRN: [id number] DOB:Nov 19, 1960, 63 y.o., female Today's Date: 02/20/2024  END OF SESSION:  PT End of Session - 02/20/24 0904     Visit Number 16    Number of Visits 19    Date for PT Re-Evaluation 02/27/24    Authorization Type [employer] m[employer]    Authorization Time Period 4 visits authorized from 02/18/24-03/01/24    Authorization - Visit Number 1    Authorization - Number of Visits 4    PT Start Time [phone]    PT Stop Time 0935    PT Time Calculation (min) 41 min    Activity Tolerance Patient tolerated treatment well    Behavior During Therapy WFL for tasks assessed/performed                  Past Medical History:  Diagnosis Date   Anemia    Anxiety    Arthritis    Asthma    Chronic fatigue    Endometriosis    Family history of osteoporosis    GERD (gastroesophageal reflux disease)    Heart murmur    History of kidney stones    Hypertension    IBS (irritable bowel syndrome)    Obesity    Surgical menopause    Vaginal dryness    Past Surgical History:  Procedure Laterality Date   ABDOMINAL HYSTERECTOMY     tah/bso   TONSILLECTOMY  1976   TOTAL HIP ARTHROPLASTY Right 10/03/2021   Procedure: TOTAL HIP ARTHROPLASTY;  Surgeon: Arlyne Lame, MD;  Location: [facility] ORS;  Service: Orthopedics;  Laterality: Right;   Patient Active Problem List   Diagnosis Date Noted   Hx of total hip arthroplasty, right 10/03/2021   De Quervain's tenosynovitis, right 09/06/2020   BP (high blood pressure) 09/05/2015   Osteoporosis, post-menopausal 09/05/2015   Postsurgical menopause 09/05/2015   Class 1 obesity 09/05/2015   GERD (gastroesophageal reflux disease) 09/05/2015   Endometriosis 09/05/2015   Status post total hysterectomy and bilateral salpingo-oophorectomy 09/05/2015   Primary osteoarthritis of both knees 09/28/2014    PCP: [name] MD/  [name], PA-C     REFERRING PROVIDER: [name] MD  REFERRING DIAG: M17.0 (ICD-10-CM) - Bilateral primary osteoarthritis of knee   THERAPY DIAG:  Chronic pain of both knees  Gait abnormality  Muscle weakness (generalized)  Abnormal posture  Rationale for Evaluation and Treatment: Rehabilitation  ONSET DATE: 10 yrs +  SUBJECTIVE:   SUBJECTIVE STATEMENT: 02/20/24  Pt reports that she won't be able to take any anti- inflammatories due to recent coloscopy and upcoming knee surgery.  Has been using Tylenol  Arthritis for relief.  Pain is high today.   POOL ACCESS: currently none. Unsure about access since will be having surgery soon.   Initial subjective: Planned TKR June 30 by Dr [name].  I [facility] up [employer] to get pool access.  Knee pain has begun playing with my mobility.  I was a walker but am unable to go now.  Also have OA left shoulder may need total shoulder.  Had injection 1 week ago in shoulder has reduced pain.  Not going to do anything now focusing on knee.  PERTINENT HISTORY: Bilateral shoulder pain. 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 yes: NPRS scale: current 6-7/10 bilat knees to feet Pain location: see above  Pain description: ache, stiff Aggravating factors: walking, weight baring Relieving factors:  rest  PRECAUTIONS: None  RED FLAGS: None   WEIGHT BEARING RESTRICTIONS: No  FALLS:  Has patient fallen in last 6 months? No  LIVING ENVIRONMENT: Lives with: lives with their family Lives in: House/apartment Stairs: 5 steps to exit home handrail Has following equipment at home: Single point cane  OCCUPATION: retired  PLOF: Requires assistive device for independence  PATIENT GOALS: get back to walking, dance at sons wedding in [place],  [place] MD VISIT:  2 weeks  OBJECTIVE:  Note: Objective measures were completed at Evaluation unless otherwise noted.  DIAGNOSTIC FINDINGS: OA bilat knee  PATIENT SURVEYS:  LEFS  33/80  COGNITION: Overall cognitive status: Within functional limits for tasks assessed     SENSATION: WFL  EDEMA:  Slight pitted edema +1 left ankle, slight diffuse edema throughout left knee   MUSCLE LENGTH: Hamstrings: wfl   POSTURE: rounded shoulders, weight shift right, and bilat valgus deformities (left ~10d)  PALPATION: TTP about medial left knee joint line  LOWER EXTREMITY ROM:   10 degree left valgus deformity Active ROM Right eval Left eval R / L 01/30/24  Hip flexion     Hip extension     Hip abduction     Hip adduction     Hip internal rotation     Hip external rotation     Knee flexion 113 106 116 / 112  Knee extension 0 0   Ankle dorsiflexion     Ankle plantarflexion     Ankle inversion     Ankle eversion      (Blank rows = not tested)  LOWER EXTREMITY MMT:  HD lbs Right eval Left eval   Hip flexion 45.0 38.8   Hip extension     Hip abduction 32.5 34.1   Hip adduction     Hip internal rotation     Hip external rotation     Knee flexion     Knee extension 34.4 34.1   Ankle dorsiflexion     Ankle plantarflexion     Ankle inversion     Ankle eversion      (Blank rows = not tested)   FUNCTIONAL TESTS:  Timed up and go (TUG): 22.17 4 stage balance: passed 1&2; tandem x 8s; SLS x 2s  02/06/24: in 53ft6:  SLS - RLE x 20s, LLE 5s 02/20/24 in 51ft6: SLS-  RLE:       LLE:  GAIT: Distance walked: 400 ft Assistive device utilized: Single point cane Level of assistance: Complete Independence Comments: Bilat knee valgus deformities; slight hip circumduction to allow for advancement of LE; knees knocking, increased lateral displacement Gait: decreased lateral displacement, less guarding due to pain                                                                                                                                TREATMENT [facility] Adult PT Treatment:  DATE: 02/20/24 Pt seen for aquatic therapy  today.  Treatment took place in water 3.5-4.75 ft in depth at the [facility] pool. Temp of water was 91.  Pt entered/exited the pool via stairs in step-to pattern with bilat rail.  - unsupported walking forward/ backward/ side stepping  -side stepping ue add/abd with rainbow hand floats - suitcase carry with bilat rainbow hand floats under water, marching backward  - UE on rainbow hand floats:  SLS in 35ft6 30sec each LE-> repeated without UE support x 20s each ;  tandem gait forward/backward  - straddling noodle, with relaxed arms at side:  cycling; hip add/abd; hip flex/ext - UE on hand floats: hip abdct/ addct 3 x 5;  -UE on wall: hip abdct/ addct x 10 (alternating); heel raises x 10; LE swings into hip flex/ext 2x5 each - return to walking forward/ backward unsupported  - straddling noodle, with relaxed arms at side:  cycling - at stairs, L/R hamstring and quad stretch with foot on 2nd step, bilat gastroc stretch with heels off of first step - return to walking    Pt requires the buoyancy and hydrostatic pressure of water for support, and to offload joints by unweighting joint load by at least 50 % in navel deep water and by at least 75-80% in chest to neck deep water.  Viscosity of the water is needed for resistance of strengthening. Water current perturbations provides challenge to standing balance requiring increased core activation.     PATIENT EDUCATION:  Education details: Intro to aquatic therapy Person educated: Patient Education method: Explanation Education comprehension: verbalized understanding  HOME EXERCISE PROGRAM: TBA  ASSESSMENT:  CLINICAL IMPRESSION: Pt has been weaned off of anti-inflammatory meds due to recent testing and upcoming surgery affecting her overall mobility and pain level.  Gradual reduction of pain in knees to 1/10. Pt demo increased balance to 20sec on each leg unsupported; has met LTG3.  Goals ongoing as pt benefiting form aquatic  therapy intervention progressing toward land based goals.    OBJECTIVE IMPAIRMENTS: Abnormal gait, decreased balance, decreased endurance, decreased mobility, difficulty walking, decreased ROM, decreased strength, obesity, and pain.   ACTIVITY LIMITATIONS: carrying, lifting, bending, sitting, standing, squatting, sleeping, stairs, transfers, and locomotion level  PARTICIPATION LIMITATIONS: meal prep, cleaning, laundry, shopping, and community activity  PERSONAL FACTORS: see PmHx are also affecting patient's functional outcome.   REHAB POTENTIAL: Good  CLINICAL DECISION MAKING: Stable/uncomplicated  EVALUATION COMPLEXITY: Low   GOALS: Goals reviewed with patient? Yes  SHORT TERM GOALS: Target date: 01/20/24 Pt will tolerate full aquatic sessions consistently without increase in pain and with improving function to demonstrate good toleration and effectiveness of intervention.  Baseline: Goal status: MET - 01/07/24  2.  Pt will have initial aquatic HEP and will complete indep at personal pool access Baseline: no pool access yet- 02/16/24 Goal status: IN PROGRESS   3.  Pt will report 0/10 pain in knees with submerged activity Baseline:  Goal status: MET - 01/07/24  4.  Pt will tolerate 10+ minutes of continuous amb in upright position submerged to demonstrate improved toleration to amb Baseline: leaning forward on grocery cart.(Eval) Goal status: MET- 01/23/24    LONG TERM GOALS: Target date: 02/27/24  Pt to improve on LEFS by at least 9 point to demonstrate statistically significant Improvement in function. Baseline:33/80  Goal status: INITIAL  2.  Pt will improve on Tug test to <or= 15s to demonstrate improvement in lower extremity function, mobility and decreased fall risk.  Baseline:22.17 ; 21.23 01/30/24 Goal status: In progress   3.  Pt will perform SLS in 3.6 ft unsupported x 20s to demonstrate improved balance ability Baseline: see above Goal status: MET - 02/20/24  4.   Pt will be indep with final HEP's (land and aquatic as appropriate) for continued management of condition Baseline:  Goal status: INITIAL  5.  Pt will report decrease in pain by at least 50% for improved toleration to activity/quality of life and to demonstrate improved management of pain. Baseline: 40% 01/30/24 Goal status: In progress 01/30/24     PLAN:  PT FREQUENCY: 1-2x/week  PT DURATION: 8 weeks (extended due to scheduling conflicts)  PLANNED INTERVENTIONS: 30865- PT Re-evaluation, 97110-Therapeutic exercises, 97530- Therapeutic activity, 97112- Neuromuscular re-education, 97535- Self Care, 78469- Manual therapy, [phone]- Gait training, [phone]- Aquatic Therapy, [phone]- Electrical stimulation (unattended), [phone]- Ionotophoresis 4mg /ml Dexamethasone , Patient/Family education, Balance training, Stair training, Taping, Dry Needling, Joint mobilization, DME instructions, Cryotherapy, and Moist heat  PLAN FOR NEXT SESSION: aquatics: LE stretching and strengthening, balance retraining, proprioceptive retraining, pain management, gait tolerance    Almedia Jacobsen, PTA 02/20/24 10:02 AM [facility] [address] [place], [place], [address] Phone: [phone]   Fax:  [phone]      Referring diagnosis? M17.0 (ICD-10-CM) - Bilateral primary osteoarthritis of knee  Treatment diagnosis? (if different than referring diagnosis) no What was this (referring dx) caused by? []  Surgery []  Fall []  Ongoing issue [x]  Arthritis []  Other: ____________  Laterality: []  Rt []  Lt [x]  Both  Check all possible CPT codes:  *CHOOSE 10 OR LESS*    See Planned Interventions listed in the Plan section of the Evaluation.

## 2024-02-23 ENCOUNTER — Inpatient Hospital Stay: Admission: RE | Admit: 2024-02-23 | Source: Ambulatory Visit

## 2024-02-23 ENCOUNTER — Ambulatory Visit (HOSPITAL_BASED_OUTPATIENT_CLINIC_OR_DEPARTMENT_OTHER): Admitting: Physical Therapy

## 2024-02-23 ENCOUNTER — Encounter (HOSPITAL_BASED_OUTPATIENT_CLINIC_OR_DEPARTMENT_OTHER): Payer: Self-pay | Admitting: Physical Therapy

## 2024-02-23 DIAGNOSIS — M25561 Pain in right knee: Secondary | ICD-10-CM | POA: Diagnosis not present

## 2024-02-23 DIAGNOSIS — R269 Unspecified abnormalities of gait and mobility: Secondary | ICD-10-CM

## 2024-02-23 DIAGNOSIS — G8929 Other chronic pain: Secondary | ICD-10-CM

## 2024-02-23 DIAGNOSIS — M6281 Muscle weakness (generalized): Secondary | ICD-10-CM

## 2024-02-23 NOTE — Therapy (Signed)
 OUTPATIENT PHYSICAL THERAPY LOWER EXTREMITY TREATMENT    Patient Name: Latasha [name] MRN: [id number] DOB:April 26, 1961, 63 y.o., female Today's Date: 02/23/2024  END OF SESSION:  PT End of Session - 02/23/24 1158     Visit Number 17    Number of Visits 19    Date for PT Re-Evaluation 02/27/24    Authorization Type [employer] m[employer]    Authorization Time Period 4 visits authorized from 02/18/24-03/01/24    Authorization - Visit Number 2    Authorization - Number of Visits 4    PT Start Time 1155    PT Stop Time 1233    PT Time Calculation (min) 38 min    Activity Tolerance Patient tolerated treatment well    Behavior During Therapy WFL for tasks assessed/performed                  Past Medical History:  Diagnosis Date   Anemia    Anxiety    Arthritis    Asthma    Chronic fatigue    Endometriosis    Family history of osteoporosis    GERD (gastroesophageal reflux disease)    Heart murmur    History of kidney stones    Hypertension    IBS (irritable bowel syndrome)    Obesity    Surgical menopause    Vaginal dryness    Past Surgical History:  Procedure Laterality Date   ABDOMINAL HYSTERECTOMY     tah/bso   TONSILLECTOMY  1976   TOTAL HIP ARTHROPLASTY Right 10/03/2021   Procedure: TOTAL HIP ARTHROPLASTY;  Surgeon: Mardee Lynwood SQUIBB, MD;  Location: [facility] ORS;  Service: Orthopedics;  Laterality: Right;   Patient Active Problem List   Diagnosis Date Noted   Hx of total hip arthroplasty, right 10/03/2021   De Quervain's tenosynovitis, right 09/06/2020   BP (high blood pressure) 09/05/2015   Osteoporosis, post-menopausal 09/05/2015   Postsurgical menopause 09/05/2015   Class 1 obesity 09/05/2015   GERD (gastroesophageal reflux disease) 09/05/2015   Endometriosis 09/05/2015   Status post total hysterectomy and bilateral salpingo-oophorectomy 09/05/2015   Primary osteoarthritis of both knees 09/28/2014    PCP: Oneil JULIANNA Pinal MD/  Drake Chew, PA-C     REFERRING PROVIDER: Lynwood Mardee MD  REFERRING DIAG: M17.0 (ICD-10-CM) - Bilateral primary osteoarthritis of knee   THERAPY DIAG:  Chronic pain of both knees  Gait abnormality  Muscle weakness (generalized)  Rationale for Evaluation and Treatment: Rehabilitation  ONSET DATE: 10 yrs +  SUBJECTIVE:   SUBJECTIVE STATEMENT: 02/23/24  Spent w/e with husband in hospital  POOL ACCESS: currently none. Unsure about access since will be having surgery soon.   Initial subjective: Planned TKR June 30 by Dr Mardee.  I [facility] up [employer] to get pool access.  Knee pain has begun playing with my mobility.  I was a walker but am unable to go now.  Also have OA left shoulder may need total shoulder.  Had injection 1 week ago in shoulder has reduced pain.  Not going to do anything now focusing on knee.  PERTINENT HISTORY: Bilateral shoulder pain. 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 yes: NPRS scale: current 6-7/10 bilat knees to feet Pain location: see above  Pain description: ache, stiff Aggravating factors: walking, weight baring Relieving factors: rest  PRECAUTIONS: None  RED FLAGS: None   WEIGHT BEARING RESTRICTIONS: No  FALLS:  Has patient fallen in last 6 months? No  LIVING ENVIRONMENT: Lives with: lives  with their family Lives in: House/apartment Stairs: 5 steps to exit home handrail Has following equipment at home: Single point cane  OCCUPATION: retired  PLOF: Requires assistive device for independence  PATIENT GOALS: get back to walking, dance at sons wedding in [place],  [place] MD VISIT:  2 weeks  OBJECTIVE:  Note: Objective measures were completed at Evaluation unless otherwise noted.  DIAGNOSTIC FINDINGS: OA bilat knee  PATIENT SURVEYS:  LEFS 33/80  COGNITION: Overall cognitive status: Within functional limits for tasks assessed     SENSATION: WFL  EDEMA:  Slight pitted edema +1 left ankle, slight diffuse edema  throughout left knee   MUSCLE LENGTH: Hamstrings: wfl   POSTURE: rounded shoulders, weight shift right, and bilat valgus deformities (left ~10d)  PALPATION: TTP about medial left knee joint line  LOWER EXTREMITY ROM:   10 degree left valgus deformity Active ROM Right eval Left eval R / L 01/30/24  Hip flexion     Hip extension     Hip abduction     Hip adduction     Hip internal rotation     Hip external rotation     Knee flexion 113 106 116 / 112  Knee extension 0 0   Ankle dorsiflexion     Ankle plantarflexion     Ankle inversion     Ankle eversion      (Blank rows = not tested)  LOWER EXTREMITY MMT:  HD lbs Right eval Left eval   Hip flexion 45.0 38.8   Hip extension     Hip abduction 32.5 34.1   Hip adduction     Hip internal rotation     Hip external rotation     Knee flexion     Knee extension 34.4 34.1   Ankle dorsiflexion     Ankle plantarflexion     Ankle inversion     Ankle eversion      (Blank rows = not tested)   FUNCTIONAL TESTS:  Timed up and go (TUG): 22.17 4 stage balance: passed 1&2; tandem x 8s; SLS x 2s  02/06/24: in 30ft6:  SLS - RLE x 20s, LLE 5s 02/20/24 in 69ft6: SLS-  RLE:       LLE:  GAIT: Distance walked: 400 ft Assistive device utilized: Single point cane Level of assistance: Complete Independence Comments: Bilat knee valgus deformities; slight hip circumduction to allow for advancement of LE; knees knocking, increased lateral displacement Gait: decreased lateral displacement, less guarding due to pain                                                                                                                                TREATMENT [facility] Adult PT Treatment:  DATE: 02/23/24 Pt seen for aquatic therapy today.  Treatment took place in water 3.5-4.75 ft in depth at the [facility] pool. Temp of water was 91.  Pt entered/exited the pool via stairs in step-to pattern with  bilat rail.  - unsupported walking forward/ backward/ side stepping  -side stepping with high knees -at stairs, L/R hamstring and quad stretch with foot on 2nd step, bilat gastroc stretch with heels off of first step -UE on wall: hip abdct/ addct x 10 (alternating); heel/toe raises x 10 -UE support yellow HB:LE swings into hip flex/ext x10 alternating; hip add/abd crossing midline alternating x 10; HS curls - straddling noodle, with relaxed arms at side:  cycling; hip add/abd; hip flex/ext -walking between exercises for recovery   Pt requires the buoyancy and hydrostatic pressure of water for support, and to offload joints by unweighting joint load by at least 50 % in navel deep water and by at least 75-80% in chest to neck deep water.  Viscosity of the water is needed for resistance of strengthening. Water current perturbations provides challenge to standing balance requiring increased core activation.     PATIENT EDUCATION:  Education details: Intro to aquatic therapy Person educated: Patient Education method: Explanation Education comprehension: verbalized understanding  HOME EXERCISE PROGRAM: TBA  ASSESSMENT:  CLINICAL IMPRESSION: Progressed with quad and hamstring strengthening. Good toleration.  Some reduction of knee pain today vs last session. Continue pt edu on post surgery pain control (ice, elevation, meds, positioning.) 1 More session prior to surgical intervention.    OBJECTIVE IMPAIRMENTS: Abnormal gait, decreased balance, decreased endurance, decreased mobility, difficulty walking, decreased ROM, decreased strength, obesity, and pain.   ACTIVITY LIMITATIONS: carrying, lifting, bending, sitting, standing, squatting, sleeping, stairs, transfers, and locomotion level  PARTICIPATION LIMITATIONS: meal prep, cleaning, laundry, shopping, and community activity  PERSONAL FACTORS: see PmHx are also affecting patient's functional outcome.   REHAB POTENTIAL:  Good  CLINICAL DECISION MAKING: Stable/uncomplicated  EVALUATION COMPLEXITY: Low   GOALS: Goals reviewed with patient? Yes  SHORT TERM GOALS: Target date: 01/20/24 Pt will tolerate full aquatic sessions consistently without increase in pain and with improving function to demonstrate good toleration and effectiveness of intervention.  Baseline: Goal status: MET - 01/07/24  2.  Pt will have initial aquatic HEP and will complete indep at personal pool access Baseline: no pool access yet- 02/16/24 Goal status: IN PROGRESS   3.  Pt will report 0/10 pain in knees with submerged activity Baseline:  Goal status: MET - 01/07/24  4.  Pt will tolerate 10+ minutes of continuous amb in upright position submerged to demonstrate improved toleration to amb Baseline: leaning forward on grocery cart.(Eval) Goal status: MET- 01/23/24    LONG TERM GOALS: Target date: 02/27/24  Pt to improve on LEFS by at least 9 point to demonstrate statistically significant Improvement in function. Baseline:33/80  Goal status: INITIAL  2.  Pt will improve on Tug test to <or= 15s to demonstrate improvement in lower extremity function, mobility and decreased fall risk. Baseline:22.17 ; 21.23 01/30/24 Goal status: In progress   3.  Pt will perform SLS in 3.6 ft unsupported x 20s to demonstrate improved balance ability Baseline: see above Goal status: MET - 02/20/24  4.  Pt will be indep with final HEP's (land and aquatic as appropriate) for continued management of condition Baseline:  Goal status: INITIAL  5.  Pt will report decrease in pain by at least 50% for improved toleration to activity/quality of life and to demonstrate  improved management of pain. Baseline: 40% 01/30/24 Goal status: In progress 01/30/24     PLAN:  PT FREQUENCY: 1-2x/week  PT DURATION: 8 weeks (extended due to scheduling conflicts)  PLANNED INTERVENTIONS: 02835- PT Re-evaluation, 97110-Therapeutic exercises, 97530- Therapeutic  activity, 97112- Neuromuscular re-education, 97535- Self Care, 02859- Manual therapy, [phone]- Gait training, [phone]- Aquatic Therapy, [phone]- Electrical stimulation (unattended), [phone]- Ionotophoresis 4mg /ml Dexamethasone , Patient/Family education, Balance training, Stair training, Taping, Dry Needling, Joint mobilization, DME instructions, Cryotherapy, and Moist heat  PLAN FOR NEXT SESSION: aquatics: LE stretching and strengthening, balance retraining, proprioceptive retraining, pain management, gait tolerance    Ronal Foots) Xandria Gallaga MPT 02/23/24 12:04 PM [facility] [address] [place], [place], [address] Phone: [phone]   Fax:  [phone]       Referring diagnosis? M17.0 (ICD-10-CM) - Bilateral primary osteoarthritis of knee  Treatment diagnosis? (if different than referring diagnosis) no What was this (referring dx) caused by? []  Surgery []  Fall []  Ongoing issue [x]  Arthritis []  Other: ____________  Laterality: []  Rt []  Lt [x]  Both  Check all possible CPT codes:  *CHOOSE 10 OR LESS*    See Planned Interventions listed in the Plan section of the Evaluation.

## 2024-02-24 ENCOUNTER — Encounter: Payer: Self-pay | Admitting: Urgent Care

## 2024-02-24 ENCOUNTER — Encounter
Admission: RE | Admit: 2024-02-24 | Discharge: 2024-02-24 | Disposition: A | Discharge status: Home / Self Care | Source: Ambulatory Visit | Attending: Orthopedic Surgery | Admitting: Orthopedic Surgery

## 2024-02-24 DIAGNOSIS — Z01812 Encounter for preprocedural laboratory examination: Secondary | ICD-10-CM | POA: Insufficient documentation

## 2024-02-24 DIAGNOSIS — Z01818 Encounter for other preprocedural examination: Secondary | ICD-10-CM | POA: Diagnosis present

## 2024-02-24 DIAGNOSIS — I10 Essential (primary) hypertension: Secondary | ICD-10-CM | POA: Diagnosis not present

## 2024-02-24 LAB — CBC
HCT: 32.4 % — ABNORMAL LOW (ref 36.0–46.0)
Hemoglobin: 10.5 g/dL — ABNORMAL LOW (ref 12.0–15.0)
MCH: 25.7 pg — ABNORMAL LOW (ref 26.0–34.0)
MCHC: 32.4 g/dL (ref 30.0–36.0)
MCV: 79.4 fL — ABNORMAL LOW (ref 80.0–100.0)
Platelets: 324 10*3/uL (ref 150–400)
RBC: 4.08 MIL/uL (ref 3.87–5.11)
RDW: 15.6 % — ABNORMAL HIGH (ref 11.5–15.5)
WBC: 7.1 10*3/uL (ref 4.0–10.5)
nRBC: 0 % (ref 0.0–0.2)

## 2024-02-24 LAB — URINALYSIS, ROUTINE W REFLEX MICROSCOPIC
Bacteria, UA: NONE SEEN
Bilirubin Urine: NEGATIVE
Glucose, UA: NEGATIVE mg/dL
Hgb urine dipstick: NEGATIVE
Ketones, ur: NEGATIVE mg/dL
Nitrite: NEGATIVE
Protein, ur: 30 mg/dL — AB
Specific Gravity, Urine: 1.029 (ref 1.005–1.030)
pH: 5 (ref 5.0–8.0)

## 2024-02-24 LAB — SEDIMENTATION RATE: Sed Rate: 61 mm/h — ABNORMAL HIGH (ref 0–30)

## 2024-02-24 LAB — COMPREHENSIVE METABOLIC PANEL WITH GFR
ALT: 15 U/L (ref 0–44)
AST: 17 U/L (ref 15–41)
Albumin: 3.7 g/dL (ref 3.5–5.0)
Alkaline Phosphatase: 97 U/L (ref 38–126)
Anion gap: 7 (ref 5–15)
BUN: 18 mg/dL (ref 8–23)
CO2: 26 mmol/L (ref 22–32)
Calcium: 9 mg/dL (ref 8.9–10.3)
Chloride: 107 mmol/L (ref 98–111)
Creatinine, Ser: 0.72 mg/dL (ref 0.44–1.00)
GFR, Estimated: >60 mL/min (ref >60)
Glucose, Bld: 103 mg/dL — ABNORMAL HIGH (ref 70–99)
Potassium: 3.4 mmol/L — ABNORMAL LOW (ref 3.5–5.1)
Sodium: 140 mmol/L (ref 135–145)
Total Bilirubin: 0.6 mg/dL (ref 0.0–1.2)
Total Protein: 7 g/dL (ref 6.5–8.1)

## 2024-02-24 LAB — C-REACTIVE PROTEIN: CRP: 4.2 mg/dL — ABNORMAL HIGH (ref <1.0)

## 2024-02-25 DIAGNOSIS — D369 Benign neoplasm, unspecified site: Secondary | ICD-10-CM | POA: Insufficient documentation

## 2024-02-27 ENCOUNTER — Ambulatory Visit (HOSPITAL_BASED_OUTPATIENT_CLINIC_OR_DEPARTMENT_OTHER): Payer: Self-pay | Admitting: Physical Therapy

## 2024-02-27 ENCOUNTER — Encounter (HOSPITAL_BASED_OUTPATIENT_CLINIC_OR_DEPARTMENT_OTHER): Payer: Self-pay | Admitting: Physical Therapy

## 2024-02-27 DIAGNOSIS — R293 Abnormal posture: Secondary | ICD-10-CM

## 2024-02-27 DIAGNOSIS — R269 Unspecified abnormalities of gait and mobility: Secondary | ICD-10-CM

## 2024-02-27 DIAGNOSIS — M6281 Muscle weakness (generalized): Secondary | ICD-10-CM

## 2024-02-27 DIAGNOSIS — G8929 Other chronic pain: Secondary | ICD-10-CM
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
## 2024-02-29 ENCOUNTER — Encounter: Payer: Self-pay | Admitting: Orthopedic Surgery

## 2024-02-29 NOTE — H&P (Signed)
 ORTHOPAEDIC HISTORY & PHYSICAL Latasha [name], [name]., Latasha [name] - 02/06/2024 2:15 PM EDT Formatting of this note is different from the original. Images from the original note were not included. Chief Complaint: Chief Complaint Patient presents with Pre-op Exam H&P - LT TKA - 06.30.25/[facility]   Reason for Visit: The patient is a 63 y.o. female who presents today for reevaluation of her left knee. She has a long history of left knee pain. She localizes most of the pain along the lateral aspect of the knee. She reports some swelling, no locking, and significant giving way of the knee. The pain is aggravated by any weight bearing. The knee pain limits the patient's ability to ambulate long distances.The patient has not appreciated any significant improvement despite tylenol , NSAIDs, intraarticular corticosteroid injections, viscosupplementation, activity modification, physical therapy, and ambulatory aids. She is using a cane for ambulation. The patient states that the knee pain has progressed to the point that it is significantly interfering with her activities of daily living.  Medications: Current Outpatient Medications Medication Sig Dispense Refill acetaminophen  (TYLENOL ) 650 MG ER tablet Take 650 mg by mouth every 8 (eight) hours as needed for Pain ALBUTEROL INHAL Inhale into the lungs As needed albuterol MDI, PROVENTIL, VENTOLIN, PROAIR, HFA 90 mcg/actuation inhaler Inhale 2 inhalations into the lungs every 4 (four) hours as needed for Wheezing 1 each 0 ascorbic acid , vitamin C, (VITAMIN C) 500 MG tablet Take 1,000 mg by mouth once daily bisoproloL -hydroCHLOROthiazide  (ZIAC ) 10-6.25 mg tablet Take 1 tablet by mouth once daily . 90 tablet 3 CAPSAICIN MISC Use Topical patch Every couple of days docusate (COLACE) 50 mg/5 mL liquid Take by mouth once daily FUROsemide (LASIX) 20 MG tablet Take 1 tablet (20 mg total) by mouth once daily as needed (fluid) 90 tablet 3 hydrOXYzine  (ATARAX ) 25 MG tablet  Take 1 tablet (25 mg total) by mouth once daily as needed FOR ITCHING 90 tablet 3 ibuprofen (MOTRIN) 800 MG tablet Take 1 tablet (800 mg total) by mouth 3 (three) times daily as needed for Pain 100 tablet 2 nystatin (MYCOSTATIN) 100,000 unit/gram cream Apply topically 2 (two) times daily 30 g 0 pantoprazole  (PROTONIX ) 40 MG DR tablet Take 1 tablet (40 mg total) by mouth once daily 90 tablet 3 potassium chloride  (KLOR-CON ) 8 MEQ ER tablet Take 1 tablet (8 mEq total) by mouth once daily 90 tablet 3 triamcinolone  0.1 % cream Apply topically 2 (two) times daily 30 g 3 valACYclovir (VALTREX) 1000 MG tablet 2 pills twice a day for 1 day only as needed fever blister 20 tablet 2  No current facility-administered medications for this visit.  Allergies: Allergies Allergen Reactions Avelox [Moxifloxacin] Other (See Comments) Does not remember - onset 01/15/2010  Past Medical History: Past Medical History: Diagnosis Date Arthritis Endometriosis Hypertension Osteoporosis, post-menopausal Surgical menopause  Past Surgical History: Past Surgical History: Procedure Laterality Date Right total hip arthroplasty 10/03/2021 Dr Mardee HYSTERECTOMY TONSILLECTOMY  Social History: Social History  Socioeconomic History Marital status: Single Spouse name: Ubaldo Number of children: 2 Years of education: 12 Highest education level: High school graduate Occupational History Occupation: RetiredFilm/video editor Asst Tobacco Use Smoking status: Former Current packs/day: 0.00 Average packs/day: 0.5 packs/day for 15.0 years (7.5 ttl pk-yrs) Types: Cigarettes Start date: 71 Quit date: 1999 Years since quitting: 26.4 Smokeless tobacco: Never Vaping Use Vaping status: Never Used Substance and Sexual Activity Alcohol use: Yes Alcohol/week: 0.0 standard drinks of alcohol Comment: Rarely Drug use: No Sexual activity: Defer Partners: Male Social History Narrative Feels  safe in home.  Social Drivers  of Catering manager Strain: Low Risk (08/21/2023) Overall Financial Resource Strain (CARDIA) Difficulty of Paying Living Expenses: Not hard at all Food Insecurity: No Food Insecurity (08/21/2023) Hunger Vital Sign Worried About Running Out of Food in the Last Year: Never true Ran Out of Food in the Last Year: Never true Transportation Needs: No Transportation Needs (08/21/2023) PRAPARE - Contractor (Medical): No Lack of Transportation (Non-Medical): No Physical Activity: Insufficiently Active (05/27/2019) Received from [facility] Exercise Vital Sign Days of Exercise per Week: 1 day Minutes of Exercise per Session: 30 min Housing Stability: Low Risk (11/14/2023) Housing Stability Vital Sign Unable to Pay for Housing in the Last Year: No Number of Times Moved in the Last Year: 1 Homeless in the Last Year: No  Family History: Family History Problem Relation Name Age of Onset High blood pressure (Hypertension) Mother Osteoporosis (Thinning of bones) Mother High blood pressure (Hypertension) Father Cancer Sister appendiceal High blood pressure (Hypertension) Other Sibling Allergies Other Sibling  Review of Systems: A comprehensive 14 point ROS was performed, reviewed, and the pertinent orthopaedic findings are documented in the HPI.  Exam BP 120/76  Ht 160 cm (5' 3)  Wt 97.5 kg (215 lb)  BMI 38.09 kg/m  General: Well-developed, well-nourished female seen in no acute distress. Antalgic gait. Valgus thrust to both knees.  HEENT: Atraumatic, normocephalic. Pupils are equal and reactive to light. Extraocular motion is intact. Sclera are clear. Oropharynx is clear with moist mucosa.  Neck: Supple, nontender, and with good ROM. No thyromegaly, adenopathy, JVD, or carotid bruits.  Lungs: Clear to auscultation bilaterally.  Cardiovascular: Regular rate and rhythm. Normal S1, S2. No murmur. No appreciable gallops or rubs. Peripheral  pulses are palpable. No lower extremity edema. Homan`s test is negative.  Abdomen: Soft, nontender, nondistended. Bowel sounds are present.  Extremities: Good strength, stability, and range of motion of the upper extremities. Good range of motion of the hips and ankles.  Left Knee: Soft tissue swelling: mild Effusion: minimal Erythema: none Crepitance: mild Tenderness: lateral Alignment: relative valgus Mediolateral laxity: lateral pseudolaxity Posterior sag: negative Patellar tracking: Good tracking without evidence of subluxation or tilt Atrophy: No significant atrophy. Quadriceps tone was fair to good. Range of motion: 0/3/104 degrees  Neurologic: Awake, alert, and oriented. Sensory function is intact to pinprick and light touch. Motor strength is judged to be 5/5. Motor coordination is within normal limits. No apparent clonus. No tremor.  X-rays: I ordered and interpreted standing AP, lateral, and sunrise radiographs of the left knee that were obtained in the office today. There is significant narrowing of the lateral cartilage space with bone-on-bone articulation and associated valgus alignment. Osteophyte formation is noted. Subchondral sclerosis is noted. Osteochondral loose bodies are present. Degenerative changes to the patellofemoral articulation are noted. No evidence of fracture or dislocation.  Impression: Degenerative arthrosis of the left knee  Plan: The findings were discussed in detail with the patient. The patient was given informational material on total knee replacement. Conservative treatment options were reviewed with the patient. We discussed the risks and benefits of surgical intervention. The usual perioperative course was also discussed in detail. The patient expressed understanding of the risks and benefits of surgical intervention and would like to proceed with plans for left total knee arthroplasty.  The patient would like to resume physical  therapy with [facility] at [facility] after completing home PT.  I spent a total of 45 minutes in both  face-to-face and non-face-to-face activities, excluding procedures performed, for this visit on the date of this encounter.  MEDICAL CLEARANCE: Per anesthesiology. ACTIVITY: As tolerated. WORK STATUS: Not applicable. THERAPY: Preoperative physical therapy evaluation. MEDICATIONS: Requested Prescriptions  No prescriptions requested or ordered in this encounter  FOLLOW-UP: Return for postoperative follow-up.  Albany Winslow P. Harini Dearmond, Jr., M.D.  This note was generated in part with voice recognition software and I apologize for any typographical errors that were not detected and corrected. Electronically signed by Mardee Latasha Idalia Mickey., Latasha [name] at 02/08/2024 11:58 AM EDT

## 2024-03-01 ENCOUNTER — Encounter
Admission: RE | Disposition: A | Discharge status: Home / Self Care | Payer: Self-pay | Source: Home / Self Care | Attending: Orthopedic Surgery

## 2024-03-01 ENCOUNTER — Ambulatory Visit: Payer: Self-pay | Admitting: Urgent Care

## 2024-03-01 ENCOUNTER — Other Ambulatory Visit: Payer: Self-pay

## 2024-03-01 ENCOUNTER — Observation Stay

## 2024-03-01 ENCOUNTER — Observation Stay
Admission: RE | Admit: 2024-03-01 | Discharge: 2024-03-02 | Disposition: A | Discharge status: Home / Self Care | Attending: Orthopedic Surgery | Admitting: Orthopedic Surgery

## 2024-03-01 ENCOUNTER — Ambulatory Visit: Admitting: Certified Registered"

## 2024-03-01 ENCOUNTER — Encounter: Payer: Self-pay | Admitting: Orthopedic Surgery

## 2024-03-01 DIAGNOSIS — Z87891 Personal history of nicotine dependence: Secondary | ICD-10-CM | POA: Insufficient documentation

## 2024-03-01 DIAGNOSIS — I10 Essential (primary) hypertension: Secondary | ICD-10-CM | POA: Diagnosis not present

## 2024-03-01 DIAGNOSIS — M1712 Unilateral primary osteoarthritis, left knee: Principal | ICD-10-CM | POA: Insufficient documentation

## 2024-03-01 DIAGNOSIS — Z96641 Presence of right artificial hip joint: Secondary | ICD-10-CM | POA: Insufficient documentation

## 2024-03-01 DIAGNOSIS — Z79899 Other long term (current) drug therapy: Secondary | ICD-10-CM | POA: Insufficient documentation

## 2024-03-01 DIAGNOSIS — J45909 Unspecified asthma, uncomplicated: Secondary | ICD-10-CM | POA: Diagnosis not present

## 2024-03-01 DIAGNOSIS — Z96652 Presence of left artificial knee joint: Secondary | ICD-10-CM

## 2024-03-01 DIAGNOSIS — Z01812 Encounter for preprocedural laboratory examination: Secondary | ICD-10-CM

## 2024-03-01 HISTORY — PX: KNEE ARTHROPLASTY: SHX992

## 2024-03-01 SURGERY — ARTHROPLASTY, KNEE, TOTAL, USING IMAGELESS COMPUTER-ASSISTED NAVIGATION
Anesthesia: Spinal | Site: Knee | Laterality: Left

## 2024-03-01 MED ORDER — TRANEXAMIC ACID-NACL 1000-0.7 MG/100ML-% IV SOLN
1000.0000 mg | INTRAVENOUS | Status: AC
Start: 2024-03-01 — End: 2024-03-01
  Administered 2024-03-01: 1000 mg via INTRAVENOUS

## 2024-03-01 MED ORDER — ONDANSETRON HCL 4 MG/2ML IJ SOLN
INTRAMUSCULAR | Status: DC | PRN
  Administered 2024-03-01: 4 mg via INTRAVENOUS

## 2024-03-01 MED ORDER — TRANEXAMIC ACID-NACL 1000-0.7 MG/100ML-% IV SOLN
1000.0000 mg | Freq: Once | INTRAVENOUS | Status: AC
  Administered 2024-03-01: 1000 mg via INTRAVENOUS

## 2024-03-01 MED ORDER — LIDOCAINE HCL (PF) 2 % IJ SOLN
INTRAMUSCULAR | Status: AC
  Filled 2024-03-01: qty 5

## 2024-03-01 MED ORDER — HYDROCHLOROTHIAZIDE 12.5 MG PO TABS
6.2500 mg | ORAL_TABLET | Freq: Every day | ORAL | Status: DC
  Administered 2024-03-02: 6.25 mg via ORAL
  Filled 2024-03-01: qty 1

## 2024-03-01 MED ORDER — BISACODYL 10 MG RE SUPP: 10.0000 mg | Freq: Every day | RECTAL | Status: DC | PRN

## 2024-03-01 MED ORDER — PHENYLEPHRINE 80 MCG/ML (10ML) SYRINGE FOR IV PUSH (FOR BLOOD PRESSURE SUPPORT)
PREFILLED_SYRINGE | INTRAVENOUS | Status: DC | PRN
  Administered 2024-03-01 (×2): 80 ug via INTRAVENOUS

## 2024-03-01 MED ORDER — SODIUM CHLORIDE 0.9 % IR SOLN
Status: DC | PRN
  Administered 2024-03-01: 3000 mL

## 2024-03-01 MED ORDER — CHLORHEXIDINE GLUCONATE 0.12 % MT SOLN
15.0000 mL | Freq: Once | OROMUCOSAL | Status: AC
  Administered 2024-03-01: 15 mL via OROMUCOSAL

## 2024-03-01 MED ORDER — CELECOXIB 200 MG PO CAPS
ORAL_CAPSULE | ORAL | Status: AC
  Filled 2024-03-01: qty 2

## 2024-03-01 MED ORDER — CEFAZOLIN SODIUM-DEXTROSE 2-4 GM/100ML-% IV SOLN
2.0000 g | INTRAVENOUS | Status: AC
  Administered 2024-03-01: 2 g via INTRAVENOUS

## 2024-03-01 MED ORDER — ACETAMINOPHEN 10 MG/ML IV SOLN
INTRAVENOUS | Status: AC
Start: 2024-03-01 — End: 2024-03-01
  Filled 2024-03-01: qty 100

## 2024-03-01 MED ORDER — CEFAZOLIN SODIUM-DEXTROSE 2-4 GM/100ML-% IV SOLN
INTRAVENOUS | Status: AC
  Filled 2024-03-01: qty 100

## 2024-03-01 MED ORDER — OXYCODONE HCL 5 MG PO TABS
ORAL_TABLET | ORAL | Status: AC
  Filled 2024-03-01: qty 2

## 2024-03-01 MED ORDER — HYDROMORPHONE HCL 1 MG/ML IJ SOLN
0.5000 mg | INTRAMUSCULAR | Status: DC | PRN
  Filled 2024-03-01: qty 1

## 2024-03-01 MED ORDER — GABAPENTIN 300 MG PO CAPS
300.0000 mg | ORAL_CAPSULE | Freq: Once | ORAL | Status: AC
  Administered 2024-03-01: 300 mg via ORAL

## 2024-03-01 MED ORDER — CHLORHEXIDINE GLUCONATE 4 % EX SOLN
60.0000 mL | Freq: Once | CUTANEOUS | Status: DC
  Administered 2024-03-01: 4 via TOPICAL

## 2024-03-01 MED ORDER — PANTOPRAZOLE SODIUM 40 MG PO TBEC
40.0000 mg | DELAYED_RELEASE_TABLET | Freq: Two times a day (BID) | ORAL | Status: DC
  Administered 2024-03-01 – 2024-03-02 (×3): 40 mg via ORAL
  Filled 2024-03-01 (×3): qty 1

## 2024-03-01 MED ORDER — DEXAMETHASONE SODIUM PHOSPHATE 10 MG/ML IJ SOLN
INTRAMUSCULAR | Status: AC
  Filled 2024-03-01: qty 1

## 2024-03-01 MED ORDER — LIDOCAINE HCL (CARDIAC) PF 100 MG/5ML IV SOSY
PREFILLED_SYRINGE | INTRAVENOUS | Status: DC | PRN
  Administered 2024-03-01: 40 mg via INTRAVENOUS

## 2024-03-01 MED ORDER — BUPIVACAINE HCL (PF) 0.5 % IJ SOLN
INTRAMUSCULAR | Status: DC | PRN
  Administered 2024-03-01: 3 mL

## 2024-03-01 MED ORDER — ALUM & MAG HYDROXIDE-SIMETH 200-200-20 MG/5ML PO SUSP
30.0000 mL | ORAL | Status: DC | PRN
Start: 2024-03-01 — End: 2024-03-02

## 2024-03-01 MED ORDER — OXYCODONE HCL 5 MG PO TABS
5.0000 mg | ORAL_TABLET | ORAL | Status: DC | PRN
  Administered 2024-03-02 (×2): 5 mg via ORAL
  Filled 2024-03-01 (×2): qty 1

## 2024-03-01 MED ORDER — ENSURE PRE-SURGERY PO LIQD
296.0000 mL | Freq: Once | ORAL | Status: DC
  Administered 2024-03-01: 296 mL via ORAL
  Filled 2024-03-01: qty 296

## 2024-03-01 MED ORDER — BUPIVACAINE HCL (PF) 0.25 % IJ SOLN
INTRAMUSCULAR | Status: DC | PRN
  Administered 2024-03-01: 60 mL

## 2024-03-01 MED ORDER — FENTANYL CITRATE (PF) 100 MCG/2ML IJ SOLN
25.0000 ug | INTRAMUSCULAR | Status: DC | PRN
  Administered 2024-03-01 (×2): 25 ug via INTRAVENOUS

## 2024-03-01 MED ORDER — FENTANYL CITRATE (PF) 100 MCG/2ML IJ SOLN
INTRAMUSCULAR | Status: AC
  Filled 2024-03-01: qty 2

## 2024-03-01 MED ORDER — MENTHOL 3 MG MT LOZG: 1.0000 | LOZENGE | OROMUCOSAL | Status: DC | PRN

## 2024-03-01 MED ORDER — PHENOL 1.4 % MT LIQD: 1.0000 | OROMUCOSAL | Status: DC | PRN

## 2024-03-01 MED ORDER — MAGNESIUM HYDROXIDE 400 MG/5ML PO SUSP
30.0000 mL | Freq: Every day | ORAL | Status: DC
  Administered 2024-03-02: 30 mL via ORAL
  Filled 2024-03-01: qty 30

## 2024-03-01 MED ORDER — ONDANSETRON HCL 4 MG/2ML IJ SOLN: 4.0000 mg | Freq: Four times a day (QID) | INTRAMUSCULAR | Status: DC | PRN

## 2024-03-01 MED ORDER — CELECOXIB 200 MG PO CAPS
200.0000 mg | ORAL_CAPSULE | Freq: Two times a day (BID) | ORAL | Status: DC
  Administered 2024-03-01 – 2024-03-02 (×2): 200 mg via ORAL
  Filled 2024-03-01 (×2): qty 1

## 2024-03-01 MED ORDER — CHLORHEXIDINE GLUCONATE 0.12 % MT SOLN
OROMUCOSAL | Status: AC
  Filled 2024-03-01: qty 15

## 2024-03-01 MED ORDER — SODIUM CHLORIDE 0.9 % IV SOLN: INTRAVENOUS | Status: DC

## 2024-03-01 MED ORDER — SENNOSIDES-DOCUSATE SODIUM 8.6-50 MG PO TABS
1.0000 | ORAL_TABLET | Freq: Two times a day (BID) | ORAL | Status: DC
  Administered 2024-03-01 – 2024-03-02 (×2): 1 via ORAL
  Filled 2024-03-01 (×2): qty 1

## 2024-03-01 MED ORDER — ORAL CARE MOUTH RINSE: 15.0000 mL | Freq: Once | OROMUCOSAL | Status: AC

## 2024-03-01 MED ORDER — GABAPENTIN 300 MG PO CAPS
ORAL_CAPSULE | ORAL | Status: AC
Start: 2024-03-01 — End: 2024-03-01
  Filled 2024-03-01: qty 1

## 2024-03-01 MED ORDER — ASPIRIN 81 MG PO CHEW
81.0000 mg | CHEWABLE_TABLET | Freq: Two times a day (BID) | ORAL | Status: DC
Start: 2024-03-01 — End: 2024-03-02
  Administered 2024-03-01 – 2024-03-02 (×2): 81 mg via ORAL
  Filled 2024-03-01 (×2): qty 1

## 2024-03-01 MED ORDER — OXYCODONE HCL 5 MG PO TABS
10.0000 mg | ORAL_TABLET | ORAL | Status: DC | PRN
  Administered 2024-03-01: 10 mg via ORAL

## 2024-03-01 MED ORDER — BISOPROLOL-HYDROCHLOROTHIAZIDE 10-6.25 MG PO TABS
1.0000 | ORAL_TABLET | Freq: Every day | ORAL | Status: DC
  Filled 2024-03-01: qty 1

## 2024-03-01 MED ORDER — DIPHENHYDRAMINE HCL 12.5 MG/5ML PO ELIX: 12.5000 mg | ORAL_SOLUTION | ORAL | Status: DC | PRN

## 2024-03-01 MED ORDER — MIDAZOLAM HCL 5 MG/5ML IJ SOLN
INTRAMUSCULAR | Status: DC | PRN
  Administered 2024-03-01: 2 mg via INTRAVENOUS

## 2024-03-01 MED ORDER — HYDROXYZINE HCL 25 MG PO TABS: 25.0000 mg | ORAL_TABLET | Freq: Every day | ORAL | Status: DC | PRN

## 2024-03-01 MED ORDER — ONDANSETRON HCL 4 MG PO TABS: 4.0000 mg | ORAL_TABLET | Freq: Four times a day (QID) | ORAL | Status: DC | PRN

## 2024-03-01 MED ORDER — DEXAMETHASONE SODIUM PHOSPHATE 10 MG/ML IJ SOLN
8.0000 mg | Freq: Once | INTRAMUSCULAR | Status: AC
  Administered 2024-03-01: 8 mg via INTRAVENOUS

## 2024-03-01 MED ORDER — CELECOXIB 200 MG PO CAPS
400.0000 mg | ORAL_CAPSULE | Freq: Once | ORAL | Status: AC
  Administered 2024-03-01: 400 mg via ORAL

## 2024-03-01 MED ORDER — FERROUS SULFATE 325 (65 FE) MG PO TABS
325.0000 mg | ORAL_TABLET | Freq: Two times a day (BID) | ORAL | Status: DC
  Administered 2024-03-02: 325 mg via ORAL
  Filled 2024-03-01: qty 1

## 2024-03-01 MED ORDER — METOCLOPRAMIDE HCL 10 MG PO TABS
10.0000 mg | ORAL_TABLET | Freq: Three times a day (TID) | ORAL | Status: DC
  Administered 2024-03-01 – 2024-03-02 (×2): 10 mg via ORAL
  Filled 2024-03-01 (×2): qty 1

## 2024-03-01 MED ORDER — ACETAMINOPHEN 325 MG PO TABS: 325.0000 mg | ORAL_TABLET | Freq: Four times a day (QID) | ORAL | Status: DC | PRN

## 2024-03-01 MED ORDER — ALBUTEROL SULFATE HFA 108 (90 BASE) MCG/ACT IN AERS: 2.0000 | INHALATION_SPRAY | RESPIRATORY_TRACT | Status: DC | PRN

## 2024-03-01 MED ORDER — LACTATED RINGERS IV SOLN: INTRAVENOUS | Status: DC

## 2024-03-01 MED ORDER — PHENYLEPHRINE HCL-NACL 20-0.9 MG/250ML-% IV SOLN
INTRAVENOUS | Status: DC | PRN
  Administered 2024-03-01: 30 ug/min via INTRAVENOUS

## 2024-03-01 MED ORDER — PROPOFOL 1000 MG/100ML IV EMUL
INTRAVENOUS | Status: AC
  Filled 2024-03-01: qty 100

## 2024-03-01 MED ORDER — ACETAMINOPHEN 10 MG/ML IV SOLN
1000.0000 mg | Freq: Four times a day (QID) | INTRAVENOUS | Status: DC
  Administered 2024-03-01 – 2024-03-02 (×3): 1000 mg via INTRAVENOUS
  Filled 2024-03-01 (×2): qty 100

## 2024-03-01 MED ORDER — LORATADINE 10 MG PO TABS
10.0000 mg | ORAL_TABLET | Freq: Every day | ORAL | Status: DC
  Administered 2024-03-01 – 2024-03-02 (×2): 10 mg via ORAL
  Filled 2024-03-01 (×2): qty 1

## 2024-03-01 MED ORDER — TRAMADOL HCL 50 MG PO TABS
50.0000 mg | ORAL_TABLET | ORAL | Status: DC | PRN
  Administered 2024-03-01 (×2): 100 mg via ORAL
  Filled 2024-03-01 (×2): qty 2

## 2024-03-01 MED ORDER — BISOPROLOL FUMARATE 5 MG PO TABS
10.0000 mg | ORAL_TABLET | Freq: Every day | ORAL | Status: DC
  Administered 2024-03-02: 10 mg via ORAL
  Filled 2024-03-01: qty 2

## 2024-03-01 MED ORDER — FLEET ENEMA RE ENEM: 1.0000 | ENEMA | Freq: Once | RECTAL | Status: DC | PRN

## 2024-03-01 MED ORDER — SODIUM CHLORIDE 0.9 % IV SOLN
INTRAVENOUS | Status: DC | PRN
  Administered 2024-03-01: 60 mL

## 2024-03-01 MED ORDER — TRANEXAMIC ACID-NACL 1000-0.7 MG/100ML-% IV SOLN
INTRAVENOUS | Status: AC
  Filled 2024-03-01: qty 100

## 2024-03-01 MED ORDER — BISOPROLOL FUMARATE 5 MG PO TABS
10.0000 mg | ORAL_TABLET | Freq: Once | ORAL | Status: AC
  Administered 2024-03-01: 10 mg via ORAL
  Filled 2024-03-01: qty 2

## 2024-03-01 MED ORDER — MIDAZOLAM HCL 2 MG/2ML IJ SOLN
INTRAMUSCULAR | Status: AC
Start: 2024-03-01 — End: 2024-03-01
  Filled 2024-03-01: qty 2

## 2024-03-01 MED ORDER — SURGIPHOR WOUND IRRIGATION SYSTEM - OPTIME
TOPICAL | Status: DC | PRN
  Administered 2024-03-01: 450 mL via TOPICAL

## 2024-03-01 MED ORDER — PROPOFOL 500 MG/50ML IV EMUL
INTRAVENOUS | Status: DC | PRN
  Administered 2024-03-01: 40 mg via INTRAVENOUS
  Administered 2024-03-01: 30 mg via INTRAVENOUS
  Administered 2024-03-01: 75 ug/kg/min via INTRAVENOUS

## 2024-03-01 MED ORDER — PHENYLEPHRINE HCL-NACL 20-0.9 MG/250ML-% IV SOLN
INTRAVENOUS | Status: AC
  Filled 2024-03-01: qty 250

## 2024-03-01 MED ORDER — CEFAZOLIN SODIUM-DEXTROSE 2-4 GM/100ML-% IV SOLN
2.0000 g | Freq: Four times a day (QID) | INTRAVENOUS | Status: AC
  Administered 2024-03-01 (×2): 2 g via INTRAVENOUS
  Filled 2024-03-01 (×2): qty 100

## 2024-03-01 MED ORDER — FUROSEMIDE 20 MG PO TABS: 20.0000 mg | ORAL_TABLET | Freq: Every day | ORAL | Status: DC | PRN

## 2024-03-01 MED ORDER — ACETAMINOPHEN 10 MG/ML IV SOLN
INTRAVENOUS | Status: DC | PRN
  Administered 2024-03-01: 1000 mg via INTRAVENOUS

## 2024-03-01 SURGICAL SUPPLY — 65 items
ATTUNE PS FEM LT SZ 3 CEM KNEE (Femur) IMPLANT
ATTUNE PSRP INSR SZ3 10 KNEE (Insert) IMPLANT
BASEPLATE TIBIAL ROTATING SZ 4 (Knees) IMPLANT
BATTERY INSTRU NAVIGATION (MISCELLANEOUS) ×4 IMPLANT
BIT DRILL QUICK REL 1/8 2PK SL (BIT) ×1 IMPLANT
BLADE CLIPPER SURG (BLADE) IMPLANT
BLADE SAW 70X12.5 (BLADE) ×1 IMPLANT
BLADE SAW 90X13X1.19 OSCILLAT (BLADE) ×1 IMPLANT
BLADE SAW 90X25X1.19 OSCILLAT (BLADE) ×1 IMPLANT
BRUSH SCRUB EZ PLAIN DRY (MISCELLANEOUS) ×1 IMPLANT
CEMENT BONE GENTAMICIN 40 (Cement) IMPLANT
COOLER POLAR GLACIER W/PUMP (MISCELLANEOUS) IMPLANT
CUFF TRNQT CYL 24X4X16.5-23 (TOURNIQUET CUFF) IMPLANT
CUFF TRNQT CYL 30X4X21-28X (TOURNIQUET CUFF) IMPLANT
DRAPE SHEET LG 3/4 BI-LAMINATE (DRAPES) ×1 IMPLANT
DRSG AQUACEL AG ADV 3.5X 4 (GAUZE/BANDAGES/DRESSINGS) IMPLANT
DRSG AQUACEL AG ADV 3.5X14 (GAUZE/BANDAGES/DRESSINGS) ×1 IMPLANT
DRSG MEPILEX SACRM 8.7X9.8 (GAUZE/BANDAGES/DRESSINGS) ×1 IMPLANT
DRSG TEGADERM 4X4.75 (GAUZE/BANDAGES/DRESSINGS) ×1 IMPLANT
DRSG XEROFORM 1X8 (GAUZE/BANDAGES/DRESSINGS) IMPLANT
DURAPREP 26ML APPLICATOR (WOUND CARE) ×2 IMPLANT
ELECT CAUTERY BLADE 6.4 (BLADE) ×1 IMPLANT
ELECTRODE REM PT RTRN 9FT ADLT (ELECTROSURGICAL) ×1 IMPLANT
EVACUATOR 1/8 PVC DRAIN (DRAIN) ×1 IMPLANT
EX-PIN ORTHOLOCK NAV 4X150 (PIN) ×2 IMPLANT
GAUZE XEROFORM 1X8 LF (GAUZE/BANDAGES/DRESSINGS) ×1 IMPLANT
GLOVE BIOGEL M STRL SZ7.5 (GLOVE) ×6 IMPLANT
GLOVE BIOGEL PI IND STRL 8 (GLOVE) ×1 IMPLANT
GLOVE SRG 8 PF TXTR STRL LF DI (GLOVE) ×1 IMPLANT
GOWN STRL REUS W/ TWL LRG LVL3 (GOWN DISPOSABLE) ×1 IMPLANT
GOWN STRL REUS W/ TWL XL LVL3 (GOWN DISPOSABLE) ×1 IMPLANT
GOWN TOGA ZIPPER T7+ PEEL AWAY (MISCELLANEOUS) ×1 IMPLANT
HOLDER FOLEY CATH W/STRAP (MISCELLANEOUS) ×1 IMPLANT
HOOD PEEL AWAY T7 (MISCELLANEOUS) ×1 IMPLANT
KIT TURNOVER KIT A (KITS) ×1 IMPLANT
KNIFE SCULPS 14X20 (INSTRUMENTS) ×1 IMPLANT
MANIFOLD NEPTUNE II (INSTRUMENTS) ×2 IMPLANT
NDL SPNL 20GX3.5 QUINCKE YW (NEEDLE) ×2 IMPLANT
NEEDLE SPNL 20GX3.5 QUINCKE YW (NEEDLE) ×2 IMPLANT
PACK TOTAL KNEE (MISCELLANEOUS) ×1 IMPLANT
PAD ABD DERMACEA PRESS 5X9 (GAUZE/BANDAGES/DRESSINGS) ×2 IMPLANT
PAD ARMBOARD POSITIONER FOAM (MISCELLANEOUS) ×3 IMPLANT
PAD WRAPON POLAR SHDR XLG (MISCELLANEOUS) IMPLANT
PATELLA MEDIAL ATTUN 35MM KNEE (Knees) IMPLANT
PENCIL SMOKE EVACUATOR COATED (MISCELLANEOUS) ×1 IMPLANT
PIN DRILL FIX HALF THREAD (BIT) ×2 IMPLANT
PIN FIXATION 1/8DIA X 3INL (PIN) ×1 IMPLANT
SOL .9 NS 3000ML IRR UROMATIC (IV SOLUTION) ×1 IMPLANT
SOLUTION IRRIG SURGIPHOR (IV SOLUTION) ×1 IMPLANT
SPONGE DRAIN TRACH 4X4 STRL 2S (GAUZE/BANDAGES/DRESSINGS) ×1 IMPLANT
STAPLER SKIN PROX 35W (STAPLE) ×1 IMPLANT
STOCKINETTE IMPERV 14X48 (MISCELLANEOUS) ×1 IMPLANT
STOCKINETTE STRL BIAS CUT 8X4 (MISCELLANEOUS) ×1 IMPLANT
STRAP TIBIA SHORT (MISCELLANEOUS) ×1 IMPLANT
SUCTION TUBE FRAZIER 10FR DISP (SUCTIONS) ×1 IMPLANT
SUT VIC AB 0 CT1 36 (SUTURE) ×1 IMPLANT
SUT VIC AB 1 CT1 36 (SUTURE) ×2 IMPLANT
SUT VIC AB 2-0 CT2 27 (SUTURE) ×1 IMPLANT
SYR 30ML LL (SYRINGE) ×2 IMPLANT
TIP FAN IRRIG PULSAVAC PLUS (DISPOSABLE) ×1 IMPLANT
TOWEL OR 17X26 4PK STRL BLUE (TOWEL DISPOSABLE) IMPLANT
TOWER CARTRIDGE SMART MIX (DISPOSABLE) ×1 IMPLANT
TRAP FLUID SMOKE EVACUATOR (MISCELLANEOUS) ×1 IMPLANT
TRAY FOLEY MTR SLVR 16FR STAT (SET/KITS/TRAYS/PACK) ×1 IMPLANT
WATER STERILE IRR 1000ML POUR (IV SOLUTION) ×1 IMPLANT

## 2024-03-01 NOTE — Progress Notes (Signed)
 Patient is not able to walk the distance required to go the bathroom, or he/she is unable to safely negotiate stairs required to access the bathroom.  A 3in1 BSC will alleviate this problem   Latasha [name] P. Angie Fava M.D.

## 2024-03-01 NOTE — Plan of Care (Signed)
   Problem: Activity: Goal: Ability to avoid complications of mobility impairment will improve Outcome: Progressing   Problem: Clinical Measurements: Goal: Postoperative complications will be avoided or minimized Outcome: Progressing   Problem: Pain Management: Goal: Pain level will decrease with appropriate interventions Outcome: Progressing

## 2024-03-01 NOTE — Discharge Summary (Signed)
 Physician Discharge Summary  Patient ID: Latasha [name] MRN: [id number] DOB/AGE: 01-13-1961 63 y.o.  Admit date: 03/01/2024 Discharge date: 03/02/2024  Admission Diagnoses:  Primary osteoarthritis of left knee [M17.12] History of total knee arthroplasty, left [Z96.652]   Discharge Diagnoses: Patient Active Problem List   Diagnosis Date Noted   History of total knee arthroplasty, left 03/01/2024   Tubular adenoma 02/25/2024   Severe obesity (BMI 35.0-39.9) with comorbidity (HCC) 10/12/2023   Pulmonary nodule 08/21/2023   Hx of total hip arthroplasty, right 10/03/2021   De Quervain's tenosynovitis, right 09/06/2020   B12 deficiency 07/13/2020   Family history of colon cancer 12/01/2017   Benign essential hypertension 03/01/2016   Herpes zoster without complication 03/01/2016   BP (high blood pressure) 09/05/2015   Osteoporosis, post-menopausal 09/05/2015   Postsurgical menopause 09/05/2015   Class 1 obesity 09/05/2015   GERD (gastroesophageal reflux disease) 09/05/2015   Endometriosis 09/05/2015   Status post total hysterectomy and bilateral salpingo-oophorectomy 09/05/2015   Primary osteoarthritis of both knees 09/28/2014    Past Medical History:  Diagnosis Date   Anemia    Anxiety    Arthritis    Asthma    Chronic fatigue    Endometriosis    Family history of osteoporosis    GERD (gastroesophageal reflux disease)    Heart murmur    History of kidney stones    Hypertension    IBS (irritable bowel syndrome)    Obesity    Surgical menopause    Vaginal dryness      Transfusion: None.   Consultants (if any):   Discharged Condition: Improved  Hospital Course: Latasha [name] is an 63 y.o. female who was admitted 03/01/2024 with a diagnosis of primary osteoarthritis of the left knee and went to the operating room on 03/01/2024 and underwent the above named procedures.    Surgeries: Procedure(s): ARTHROPLASTY, KNEE, TOTAL, USING IMAGELESS COMPUTER-ASSISTED  NAVIGATION on 03/01/2024 Patient tolerated the surgery well. Taken to PACU where she was stabilized and then transferred to the post op recovery area.  Started on Aspirin 81mg  every 12 hrs. Heels elevated on bed with rolled towels. No evidence of DVT. Negative Homan. Physical therapy started on day #1 for gait training and transfer. OT started day #1 for ADL and assisted devices.  Patient's IV and hemovac were removed on POD1.  Foley was removed shortly following surgery.  Implants: DePuy Attune size 3 posterior stabilized femoral component (cemented), size 4 rotating platform tibial component (cemented), 35 mm medialized dome patella (cemented), and a 10 mm stabilized rotating platform polyethylene insert.   She was given perioperative antibiotics:  Anti-infectives (From admission, onward)    Start     Dose/Rate Route Frequency Ordered Stop   03/01/24 1330  ceFAZolin  (ANCEF ) IVPB 2g/100 mL premix        2 g 200 mL/hr over 30 Minutes Intravenous Every 6 hours 03/01/24 1234 03/01/24 2032   03/01/24 0615  ceFAZolin  (ANCEF ) IVPB 2g/100 mL premix        2 g 200 mL/hr over 30 Minutes Intravenous On call to O.R. 03/01/24 [id number] 03/01/24 0739     .  She was given sequential compression devices, early ambulation, and aspirin for DVT prophylaxis.  She benefited maximally from the hospital stay and there were no complications.    Recent vital signs:  Vitals:   03/02/24 0024 03/02/24 0400  BP: 127/84 121/76  Pulse: 76 71  Resp:  16  Temp: 97.6 F (36.4 C) 97.6 F (36.4  C)  SpO2: 97% 96%    Recent laboratory studies:  Lab Results  Component Value Date   HGB 10.5 (L) 02/24/2024   HGB 12.2 10/03/2021   HGB 11.6 (L) 09/24/2021   Lab Results  Component Value Date   WBC 7.1 02/24/2024   PLT 324 02/24/2024   No results found for: INR Lab Results  Component Value Date   NA 140 02/24/2024   K 3.4 (L) 02/24/2024   CL 107 02/24/2024   CO2 26 02/24/2024   BUN 18 02/24/2024    CREATININE 0.72 02/24/2024   GLUCOSE 103 (H) 02/24/2024    Discharge Medications:   Allergies as of 03/02/2024       Reactions   Moxifloxacin Hcl In Nacl Rash   Does not remember - onset 01/15/2010        Medication List     STOP taking these medications    ibuprofen 800 MG tablet Commonly known as: ADVIL       TAKE these medications    acetaminophen  650 MG CR tablet Commonly known as: TYLENOL  Take 1,300 mg by mouth every 8 (eight) hours as needed for pain.   albuterol 108 (90 Base) MCG/ACT inhaler Commonly known as: VENTOLIN HFA Inhale 2 puffs into the lungs every 4 (four) hours as needed.   alum & mag hydroxide-simeth 200-200-20 MG/5ML suspension Commonly known as: MAALOX/MYLANTA Take 30 mLs by mouth every 6 (six) hours as needed for indigestion or heartburn.   aspirin 81 MG chewable tablet Chew 1 tablet (81 mg total) by mouth 2 (two) times daily.   bisoprolol -hydrochlorothiazide  10-6.25 MG tablet Commonly known as: ZIAC  Take 1 tablet by mouth daily.   celecoxib  200 MG capsule Commonly known as: CELEBREX  Take 1 capsule (200 mg total) by mouth 2 (two) times daily.   cetirizine 10 MG tablet Commonly known as: ZYRTEC Take 10 mg by mouth daily as needed.   docusate sodium  100 MG capsule Commonly known as: COLACE Take 100 mg by mouth daily.   furosemide 20 MG tablet Commonly known as: LASIX Take 20 mg by mouth daily as needed for fluid.   Glucosamine HCl 1500 MG Tabs Take 1 tablet by mouth daily.   hydrOXYzine  25 MG tablet Commonly known as: ATARAX  Take 25 mg by mouth daily as needed for anxiety or itching.   lidocaine 4 % Place 3-4 patches onto the skin daily as needed (pain).   MUCINEX D PO Take by mouth as needed.   Multi-Vitamins Tabs Take 1 tablet by mouth daily.   nystatin cream Commonly known as: MYCOSTATIN Apply topically 2 (two) times daily.   ondansetron  4 MG tablet Commonly known as: ZOFRAN  Take 1 tablet (4 mg total) by mouth  every 6 (six) hours as needed for nausea.   OVER THE COUNTER MEDICATION as needed. CBD Cream 5000 mg   oxyCODONE  5 MG immediate release tablet Commonly known as: Oxy IR/ROXICODONE  Take 1 tablet (5 mg total) by mouth every 4 (four) hours as needed for severe pain (pain score 7-10).   pantoprazole  40 MG tablet Commonly known as: PROTONIX  Take 40 mg by mouth daily as needed (heartburn).   potassium chloride  SA 20 MEQ tablet Commonly known as: KLOR-CON  M Take 1 tablet (20 mEq total) by mouth 2 (two) times daily for 7 days. NOTE SHORT TERM DOSE INCREASE. THIS IS THE SAME MEDICATION AS YOU ALREADY HAVE, ONLY IT COVERS THE DOSE INCREASE. COMPLETE AS PRESCRIBED, THEN RETURN TO YOUR REGULAR DOSE.   predniSONE  10  MG tablet Commonly known as: DELTASONE  Take 10 mg by mouth daily.   traMADol  50 MG tablet Commonly known as: ULTRAM  Take 1-2 tablets (50-100 mg total) by mouth every 4 (four) hours as needed for moderate pain (pain score 4-6).   triamcinolone  cream 0.1 % Commonly known as: KENALOG  Apply 1 application topically 2 (two) times daily. As needed to hands. Avoid face, groin, underarms.   vitamin C 1000 MG tablet Take 1,000 mg by mouth daily.               Durable Medical Equipment  (From admission, onward)           Start     Ordered   03/01/24 1234  DME Walker rolling  Once       Question:  Patient needs a walker to treat with the following condition  Answer:  Total knee replacement status   03/01/24 1234   03/01/24 1234  DME Bedside commode  Once       Comments: Patient is not able to walk the distance required to go the bathroom, or he/she is unable to safely negotiate stairs required to access the bathroom.  A 3in1 BSC will alleviate this problem  Question:  Patient needs a bedside commode to treat with the following condition  Answer:  Total knee replacement status   03/01/24 1234            Diagnostic Studies: DG Knee Left Port Result Date:  03/01/2024 CLINICAL DATA:  Postop total knee replacement. EXAM: PORTABLE LEFT KNEE - 1-2 VIEW COMPARISON:  None Available. FINDINGS: The patient has undergone recent left total knee arthroplasty. The hardware appears well positioned. A surgical drain is in place. A small amount of air is present within the joint and soft tissues surrounding the knee. There is no evidence of acute fracture or subluxation. Anterior skin staples are noted. IMPRESSION: Satisfactory postoperative appearance of the left knee following total arthroplasty. Electronically Signed   By: Elsie Perone M.D.   On: 03/01/2024 12:55    Disposition: Discharge disposition: 01-Home or Self Care     Plan for discharge home today pending progress with PT.     Follow-up Information     Drake Chew, PA-C Follow up on 03/16/2024.   Specialty: Orthopedic Surgery Why: at 1:15pm Contact information: [address] [place] [address] [phone]         Mardee Lynwood SQUIBB, MD Follow up on 04/13/2024.   Specialty: Orthopedic Surgery Why: at 2:15pm Contact information: [address] [facility] [place] [address] [phone]                Signed: VERLINDA KRYSTAL RIGGERS 03/02/2024, 6:21 AM

## 2024-03-01 NOTE — Op Note (Signed)
 OPERATIVE NOTE  DATE OF SURGERY:  03/01/2024  PATIENT NAME:  Latasha [name]   DOB: February 11, 1961  MRN: [id number]  PRE-OPERATIVE DIAGNOSIS: Degenerative arthrosis of the left knee, primary  POST-OPERATIVE DIAGNOSIS:  Same  PROCEDURE:  Left total knee arthroplasty using computer-assisted navigation  SURGEON:  Lynwood SHAUNNA Mardee Mickey. M.D.  ASSISTANT:  Sidra Koyanagi, PA-C (present and scrubbed throughout the case, critical for assistance with exposure, retraction, instrumentation, and closure)  ANESTHESIA: spinal  ESTIMATED BLOOD LOSS: 50 mL  FLUIDS REPLACED: 1200 mL of crystalloid  TOURNIQUET TIME: 116 minutes  DRAINS: 2 medium Hemovac drains  SOFT TISSUE RELEASES: Anterior cruciate ligament, posterior cruciate ligament, deep medial collateral ligament, patellofemoral ligament  IMPLANTS UTILIZED: DePuy Attune size 3 posterior stabilized femoral component (cemented), size 4 rotating platform tibial component (cemented), 35 mm medialized dome patella (cemented), and a 10 mm stabilized rotating platform polyethylene insert.  INDICATIONS FOR SURGERY: Latasha [name] is a 63 y.o. year old female with a long history of progressive knee pain. X-rays demonstrated severe degenerative changes in tricompartmental fashion. The patient had not seen any significant improvement despite conservative nonsurgical intervention. After discussion of the risks and benefits of surgical intervention, the patient expressed understanding of the risks benefits and agree with plans for total knee arthroplasty.   The risks, benefits, and alternatives were discussed at length including but not limited to the risks of infection, bleeding, nerve injury, stiffness, blood clots, the need for revision surgery, cardiopulmonary complications, among others, and they were willing to proceed.  PROCEDURE IN DETAIL: The patient was brought into the operating room and, after adequate spinal anesthesia was achieved, a tourniquet  was placed on the patient's upper thigh. The patient's knee and leg were cleaned and prepped with alcohol and DuraPrep and draped in the usual sterile fashion. A timeout was performed as per usual protocol. The lower extremity was exsanguinated using an Esmarch, and the tourniquet was inflated to 300 mmHg. An anterior longitudinal incision was made followed by a standard mid vastus approach. The deep fibers of the medial collateral ligament were elevated in a subperiosteal fashion off of the medial flare of the tibia so as to maintain a continuous soft tissue sleeve. The patella was subluxed laterally and the patellofemoral ligament was incised. Inspection of the knee demonstrated severe degenerative changes with full-thickness loss of articular cartilage. Osteophytes were debrided using a rongeur. Anterior and posterior cruciate ligaments were excised. Two 4.0 mm Schanz pins were inserted in the femur and into the tibia for attachment of the array of trackers used for computer-assisted navigation. Hip center was identified using a circumduction technique. Distal landmarks were mapped using the computer. The distal femur and proximal tibia were mapped using the computer. The distal femoral cutting guide was positioned using computer-assisted navigation so as to achieve a 5 distal valgus cut. The femur was sized and it was felt that a size 3 femoral component was appropriate. A size 3 femoral cutting guide was positioned and the anterior cut was performed and verified using the computer. This was followed by completion of the posterior and chamfer cuts. Femoral cutting guide for the central box was then positioned in the center box cut was performed.  Attention was then directed to the proximal tibia. Medial and lateral menisci were excised. The extramedullary tibial cutting guide was positioned using computer-assisted navigation so as to achieve a 0 varus-valgus alignment and 3 posterior slope. The cut was  performed and verified using the computer. The proximal tibia  was sized and it was felt that a size 4 tibial tray was appropriate. Tibial and femoral trials were inserted followed by insertion of a 10 mm polyethylene insert. This allowed for excellent mediolateral soft tissue balancing both in flexion and in full extension. Finally, the patella was cut and prepared so as to accommodate a 35 mm medialized dome patella. A patella trial was placed and the knee was placed through a range of motion with excellent patellar tracking appreciated. The femoral trial was removed after debridement of posterior osteophytes. The central post-hole for the tibial component was reamed followed by insertion of a keel punch. Tibial trials were then removed. Cut surfaces of bone were irrigated with copious amounts of normal saline using pulsatile lavage and then suctioned dry. Polymethylmethacrylate cement with gentamicin was prepared in the usual fashion using a vacuum mixer. Cement was applied to the cut surface of the proximal tibia as well as along the undersurface of a size 4 rotating platform tibial component. Tibial component was positioned and impacted into place. Excess cement was removed using Personal assistant. Cement was then applied to the cut surfaces of the femur as well as along the posterior flanges of the size 3 femoral component. The femoral component was positioned and impacted into place. Excess cement was removed using Personal assistant. A 10 mm polyethylene trial was inserted and the knee was brought into full extension with steady axial compression applied. Finally, cement was applied to the backside of a 35 mm medialized dome patella and the patellar component was positioned and patellar clamp applied. Excess cement was removed using Personal assistant. After adequate curing of the cement, the tourniquet was deflated after a total tourniquet time of 116 minutes. Hemostasis was achieved using electrocautery. The knee  was irrigated with copious amounts of normal saline using pulsatile lavage followed by 450 ml of Surgiphor and then suctioned dry. 20 mL of 1.3% Exparel  and 60 mL of 0.25% Marcaine  in 40 mL of normal saline was injected along the posterior capsule, medial and lateral gutters, and along the arthrotomy site. A 10 mm stabilized rotating platform polyethylene insert was inserted and the knee was placed through a range of motion with excellent mediolateral soft tissue balancing appreciated and excellent patellar tracking noted. 2 medium drains were placed in the wound bed and brought out through separate stab incisions. The medial parapatellar portion of the incision was reapproximated using interrupted sutures of #1 Vicryl. Subcutaneous tissue was approximated in layers using first #0 Vicryl followed #2-0 Vicryl. The skin was approximated with skin staples. A sterile dressing was applied.  The patient tolerated the procedure well and was transported to the recovery room in stable condition.    Yasmen Cortner P. Greco Gastelum, Jr., M.D.

## 2024-03-01 NOTE — Anesthesia Preprocedure Evaluation (Addendum)
 Anesthesia Evaluation  Patient identified by MRN, date of birth, ID band Patient awake    Reviewed: Allergy & Precautions, NPO status , Patient's Chart, lab work & pertinent test results  History of Anesthesia Complications Negative for: history of anesthetic complications  Airway Mallampati: I  TM Distance: >3 FB Neck ROM: full    Dental  (+) Dental Advidsory Given, Teeth Intact, Missing   Pulmonary shortness of breath and with exertion, neg sleep apnea, neg COPD, neg recent URI, Patient abstained from smoking., former smoker High risk for sleep apnea   Pulmonary exam normal        Cardiovascular Exercise Tolerance: Poor hypertension, Pt. on medications (-) angina + DOE  (-) Past MI, (-) Cardiac Stents and (-) Orthopnea (-) dysrhythmias + Valvular Problems/Murmurs  Rhythm:Regular Rate:Normal + Peripheral Edema Chest pain while in bed around 2 weeks   Neuro/Psych  PSYCHIATRIC DISORDERS Anxiety     Bilateral Sciatica negative neurological ROS     GI/Hepatic Neg liver ROS,GERD  Controlled,,Intermittent rectal bleeding   Endo/Other  negative endocrine ROS    Renal/GU negative Renal ROS     Musculoskeletal  (+) Arthritis ,    Abdominal  (+) + obese  Peds  Hematology negative hematology ROS (+)   Anesthesia Other Findings Past Medical History: No date: Anxiety No date: Arthritis No date: Chronic fatigue No date: Diabetes mellitus without complication (HCC) No date: Endometriosis No date: Family history of osteoporosis No date: GERD (gastroesophageal reflux disease) No date: Heart murmur No date: History of kidney stones No date: Hypertension No date: IBS (irritable bowel syndrome) No date: Obesity No date: Surgical menopause No date: Vaginal dryness  Past Surgical History: No date: ABDOMINAL HYSTERECTOMY     Comment:  [place] 1976: TONSILLECTOMY     Reproductive/Obstetrics negative OB ROS                              Anesthesia Physical Anesthesia Plan  ASA: 2  Anesthesia Plan: Spinal   Post-op Pain Management: Tylenol  PO (pre-op), Celebrex  PO (pre-op) and Toradol IV (intra-op)   Induction:   PONV Risk Score and Plan: TIVA and Propofol  infusion  Airway Management Planned: Natural Airway and Nasal Cannula  Additional Equipment:   Intra-op Plan:   Post-operative Plan:   Informed Consent: I have reviewed the patients History and Physical, chart, labs and discussed the procedure including the risks, benefits and alternatives for the proposed anesthesia with the patient or authorized representative who has indicated his/her understanding and acceptance.     Dental advisory given  Plan Discussed with: Anesthesiologist, CRNA and Surgeon  Anesthesia Plan Comments:         Anesthesia Quick Evaluation

## 2024-03-01 NOTE — Interval H&P Note (Signed)
 History and Physical Interval Note:  03/01/2024 6:10 AM  Latasha [name] Latasha [name]  has presented today for surgery, with the diagnosis of Primary osteoarthritis of left knee.  The various methods of treatment have been discussed with the patient and family. After consideration of risks, benefits and other options for treatment, the patient has consented to  Procedure(s): ARTHROPLASTY, KNEE, TOTAL, USING IMAGELESS COMPUTER-ASSISTED NAVIGATION (Left) as a surgical intervention.  The patient's history has been reviewed, patient examined, no change in status, stable for surgery.  I have reviewed the patient's chart and labs.  Questions were answered to the patient's satisfaction.     Hashim Eichhorst P Maygan Koeller

## 2024-03-01 NOTE — Plan of Care (Signed)
 Patient verbalizes understanding of plan of care, discharge instructions

## 2024-03-01 NOTE — Evaluation (Signed)
 Physical Therapy Evaluation Patient Details Name: Latasha [name] MRN: [id number] DOB: [birth year] Today's Date: 03/01/2024  History of Present Illness  Pt is a 63 y.o. female s/p elected L TKA using computer assisted navigation on 03/01/24 due to chronic L lateral knee pain, limiting the pt's ability to ambulate long distances. PMH: Arthritis, Endometriosis, HTN, Osteoprosis, Anxiety, and GERD.  Clinical Impression  Prior to surgery, pt reports being mod independent (due to chronic L knee pain, limiting activity) ; lives with family in a 2 level home with 5 STE, with bedroom and bathroom on the first floor.  Currently pt is performing bed mobility (supine to sit) with min A, performed 2x trials of STS towards RW (1 trial from EOB with bed elevated, 1 trial from recliner) with min/mod A, and was able to take 6 steps fwds and bwds from bedside towards recliner with RW and mod/min a (2nd PT assisted to promote pt safety). Pt reported 8/10 L knee pain with movement and throughout session, RN notified and made aware. Pt would currently benefit from skilled PT to address noted impairments and functional limitations (see below for any additional details).  Upon hospital discharge, pt would benefit from ongoing therapy.         If plan is discharge home, recommend the following: A lot of help with walking and/or transfers;A lot of help with bathing/dressing/bathroom;Assistance with cooking/housework;Assist for transportation;Help with stairs or ramp for entrance   Can travel by private vehicle        Equipment Recommendations None recommended by PT  Recommendations for Other Services       Functional Status Assessment Patient has had a recent decline in their functional status and demonstrates the ability to make significant improvements in function in a reasonable and predictable amount of time.     Precautions / Restrictions Precautions Precautions: Fall Recall of Precautions/Restrictions:  Intact Precaution/Restrictions Comments: s/p L TKA Restrictions Weight Bearing Restrictions Per Provider Order: Yes LLE Weight Bearing Per Provider Order: Weight bearing as tolerated      Mobility  Bed Mobility Overal bed mobility: Needs Assistance Bed Mobility: Supine to Sit     Supine to sit: Min assist (for LLE managament)     General bed mobility comments: Vc's given for hand placement, positioning and to ensure safety    Transfers Overall transfer level: Needs assistance Equipment used: Rolling walker (2 wheels) Transfers: Sit to/from Stand Sit to Stand: Mod assist, Min assist           General transfer comment: 2x trials of STS towards RW (1 trial from EOB, bed elevated, 1 trial from recliner)    Ambulation/Gait Ambulation/Gait assistance: +2 safety/equipment, Min assist, Mod assist     Gait Pattern/deviations: Decreased step length - right, Decreased step length - left, Step-to pattern, Decreased stance time - left Gait velocity: decreased   Pre-gait activities: Pt was able to perform a set of 10 standing marches with approximately 3 inches of clearance off the floor, vc's for weightshifiting. General Gait Details: Pt took 6 steps fwds out of bed towards bathroom, was able to take 6 steps bwds with constant vc's for sequencing  Stairs            Wheelchair Mobility     Tilt Bed    Modified Rankin (Stroke Patients Only)       Balance Overall balance assessment: Needs assistance Sitting-balance support: Bilateral upper extremity supported, Feet supported Sitting balance-Leahy Scale: Good Sitting balance - Comments: Steady seated balance  reaching within BOS   Standing balance support: Bilateral upper extremity supported, During functional activity, Reliant on assistive device for balance Standing balance-Leahy Scale: Fair Standing balance comment: Fair standing balance with support, reliant on BUE on RW and required assistance to weightshift  towards surgical side.                             Pertinent Vitals/Pain      Home Living Family/patient expects to be discharged to:: Private residence Living Arrangements: Spouse/significant other Available Help at Discharge: Family Type of Home: House Home Access: Stairs to enter Entrance Stairs-Rails: Right;Left (B handrails at front entrance, cannot reach both at once, R sided rail to enter from the back) Entrance Stairs-Number of Steps: 5 Alternate Level Stairs-Number of Steps: a flight (reports not go upstairs, bed/bath accessible on first floor) Home Layout: Two level Home Equipment: Agricultural consultant (2 wheels);Rollator (4 wheels);Cane - single point;Shower seat;Grab bars - tub/shower;BSC/3in1      Prior Function Prior Level of Function : Independent/Modified Independent             Mobility Comments: Ambulating with SPC, RW and or rollator for all ambulation depending on the situation.        Extremity/Trunk Assessment   Upper Extremity Assessment Upper Extremity Assessment: Defer to OT evaluation    Lower Extremity Assessment Lower Extremity Assessment: Overall WFL for tasks assessed;Generalized weakness;LLE deficits/detail;RLE deficits/detail RLE Deficits / Details: Grossly: 3/5 RLE Sensation: WNL LLE Deficits / Details: Grossly:3/5 LLE Sensation: WNL       Communication   Communication Communication: No apparent difficulties    Cognition Arousal: Alert Behavior During Therapy: WFL for tasks assessed/performed   PT - Cognitive impairments: No apparent impairments                       PT - Cognition Comments: Anxious Following commands: Intact       Cueing Cueing Techniques: Verbal cues, Gestural cues     General Comments  Pre session: HR: 68 bpm, SpO2: 98% on room air,  Post session: HR: 70 bpm, SpO2: 95% on room air. Hemo Vac secure and intact throughout treatment.    Exercises Total Joint Exercises Ankle  Circles/Pumps: AROM, Strengthening, Both, 20 reps, Supine Quad Sets: AROM, Strengthening, Left, 10 reps, Supine Straight Leg Raises: AROM, Left, 10 reps, Supine, Strengthening Goniometric ROM: L knee extension: lacking 7 degrees from neutral AROM, L knee flexion: 80 degrees, AROM. Marching in Standing: AROM, Strengthening, Both, 5 reps   Assessment/Plan    PT Assessment Patient needs continued PT services  PT Problem List Decreased strength;Decreased balance;Pain;Decreased range of motion;Decreased mobility;Decreased activity tolerance       PT Treatment Interventions DME instruction;Functional mobility training;Gait training;Stair training;Therapeutic activities;Therapeutic exercise;Balance training;Patient/family education    PT Goals (Current goals can be found in the Care Plan section)  Acute Rehab PT Goals Patient Stated Goal: To go home PT Goal Formulation: With patient Time For Goal Achievement: 03/15/24 Potential to Achieve Goals: Good    Frequency BID      Co-evaluation               AM-PAC PT 6 Clicks Mobility  Outcome Measure Help needed turning from your back to your side while in a flat bed without using bedrails?: A Little Help needed moving from lying on your back to sitting on the side of a flat bed without using bedrails?: A Little  Help needed moving to and from a bed to a chair (including a wheelchair)?: A Little Help needed standing up from a chair using your arms (e.g., wheelchair or bedside chair)?: A Lot Help needed to walk in hospital room?: A Lot Help needed climbing 3-5 steps with a railing? : Total 6 Click Score: 14    End of Session Equipment Utilized During Treatment: Gait belt Activity Tolerance: Patient limited by fatigue;Patient limited by pain Patient left: in chair;with call bell/phone within reach;with nursing/sitter in room;with family/visitor present, towel roll to float L heel, SCD re-applied on RLE, polar care re-applied on LLE   Nurse Communication: Mobility status;Patient requests pain meds;Precautions PT Visit Diagnosis: Unsteadiness on feet (R26.81);Other abnormalities of gait and mobility (R26.89);Muscle weakness (generalized) (M62.81);Pain Pain - Right/Left: Left Pain - part of body: Knee    Time: 8547-8458 PT Time Calculation (min) (ACUTE ONLY): 49 min   Charges:               Santiago Graf, SPT 03/01/24, 4:10 PM

## 2024-03-01 NOTE — Transfer of Care (Cosign Needed)
 Immediate Anesthesia Transfer of Care Note  Patient: Latasha [name]  Procedure(s) Performed: ARTHROPLASTY, KNEE, TOTAL, USING IMAGELESS COMPUTER-ASSISTED NAVIGATION (Left: Knee)  Patient Location: PACU  Anesthesia Type:Spinal  Level of Consciousness: drowsy  Airway & Oxygen Therapy: Patient Spontanous Breathing and Patient connected to face mask oxygen  Post-op Assessment: Report given to RN and Post -op Vital signs reviewed and stable  Post vital signs: Reviewed and stable  Last Vitals:  Vitals Value Taken Time  BP 116/80 03/01/24 11:48  Temp    Pulse 68 03/01/24 11:50  Resp 15 03/01/24 11:50  SpO2 100 % 03/01/24 11:50  Vitals shown include unfiled device data.  Last Pain:  Vitals:   03/01/24 0637  TempSrc: Oral  PainSc: 4          Complications: No notable events documented.

## 2024-03-01 NOTE — Anesthesia Procedure Notes (Cosign Needed)
 Spinal  Patient location during procedure: OR Start time: 03/01/2024 7:23 AM End time: 03/01/2024 7:28 AM Reason for block: surgical anesthesia Staffing Resident/CRNA: Niki Manus SAUNDERS, CRNA Performed by: Dario Barter, MD Authorized by: Dario Barter, MD   Preanesthetic Checklist Completed: patient identified, IV checked, site marked, risks and benefits discussed, surgical consent, monitors and equipment checked, pre-op evaluation and timeout performed Spinal Block Patient position: sitting Prep: ChloraPrep Patient monitoring: continuous pulse ox and blood pressure Approach: midline Location: L4-5 Injection technique: single-shot Needle Needle type: Pencan  Needle gauge: 24 G Needle length: 10 cm Assessment Sensory level: T6

## 2024-03-01 NOTE — TOC Initial Note (Signed)
 Transition of Care [facility]) - Initial/Assessment Note    Patient Details  Name: Latasha [name] MRN: [id number] Date of Birth: November 24, 1960  [facility] CM/SW Contact:    Quintella Suzen Jansky, RN Phone Number: 03/01/2024, 3:17 PM  Clinical Narrative:                  Received message from bedside nurse, patient has RW and BSC.        Patient Goals and CMS Choice            Expected Discharge Plan and Services                                              Prior Living Arrangements/Services                       Activities of Daily Living   ADL Screening (condition at time of admission) Independently performs ADLs?: Yes (appropriate for developmental age) Is the patient deaf or have difficulty hearing?: No Does the patient have difficulty seeing, even when wearing glasses/contacts?: No Does the patient have difficulty concentrating, remembering, or making decisions?: No  Permission Sought/Granted                  Emotional Assessment              Admission diagnosis:  Primary osteoarthritis of left knee [M17.12] History of total knee arthroplasty, left [Z96.652] Patient Active Problem List   Diagnosis Date Noted   History of total knee arthroplasty, left 03/01/2024   Tubular adenoma 02/25/2024   Severe obesity (BMI 35.0-39.9) with comorbidity (HCC) 10/12/2023   Pulmonary nodule 08/21/2023   Hx of total hip arthroplasty, right 10/03/2021   De Quervain's tenosynovitis, right 09/06/2020   B12 deficiency 07/13/2020   Family history of colon cancer 12/01/2017   Benign essential hypertension 03/01/2016   Herpes zoster without complication 03/01/2016   BP (high blood pressure) 09/05/2015   Osteoporosis, post-menopausal 09/05/2015   Postsurgical menopause 09/05/2015   Class 1 obesity 09/05/2015   GERD (gastroesophageal reflux disease) 09/05/2015   Endometriosis 09/05/2015   Status post total hysterectomy and bilateral  salpingo-oophorectomy 09/05/2015   Primary osteoarthritis of both knees 09/28/2014   PCP:  Cleotilde Oneil FALCON, MD Pharmacy:   [id number] [address] ([address]), [place] - [address] [place] ([place]) [place] [address] Phone: [phone] Fax: [phone]  [id number] - [place], [place] - [address] [place] [address] Phone: [phone] Fax: [phone]     Social Drivers of Health (SDOH) Social History: SDOH Screenings   Food Insecurity: No Food Insecurity (03/01/2024)  Housing: Low Risk  (03/01/2024)  Transportation Needs: No Transportation Needs (03/01/2024)  Utilities: Not At Risk (03/01/2024)  Financial Resource Strain: Low Risk  (08/21/2023)   Received from [facility]  Physical Activity: Insufficiently Active (05/27/2019)  Tobacco Use: Medium Risk (03/01/2024)   SDOH Interventions:     Readmission Risk Interventions     No data to display

## 2024-03-02 ENCOUNTER — Encounter: Payer: Self-pay | Admitting: Orthopedic Surgery

## 2024-03-02 DIAGNOSIS — M1712 Unilateral primary osteoarthritis, left knee: Secondary | ICD-10-CM | POA: Diagnosis not present

## 2024-03-02 MED ORDER — ONDANSETRON HCL 4 MG PO TABS
4.0000 mg | ORAL_TABLET | Freq: Four times a day (QID) | ORAL | 0 refills | Status: AC | PRN
Start: 2024-03-02 — End: ?

## 2024-03-02 MED ORDER — ASPIRIN 81 MG PO CHEW: 81.0000 mg | CHEWABLE_TABLET | Freq: Two times a day (BID) | ORAL | 0 refills | Status: AC

## 2024-03-02 MED ORDER — ACETAMINOPHEN 10 MG/ML IV SOLN
INTRAVENOUS | Status: AC
  Filled 2024-03-02: qty 100

## 2024-03-02 MED ORDER — TRAMADOL HCL 50 MG PO TABS: 50.0000 mg | ORAL_TABLET | ORAL | 0 refills | Status: AC | PRN

## 2024-03-02 MED ORDER — OXYCODONE HCL 5 MG PO TABS: 5.0000 mg | ORAL_TABLET | ORAL | 0 refills | Status: AC | PRN

## 2024-03-02 NOTE — Plan of Care (Signed)
   Problem: Activity: Goal: Ability to avoid complications of mobility impairment will improve Outcome: Progressing   Problem: Pain Management: Goal: Pain level will decrease with appropriate interventions Outcome: Progressing   Problem: Skin Integrity: Goal: Will show signs of wound healing Outcome: Progressing

## 2024-03-02 NOTE — Progress Notes (Signed)
  Subjective: 1 Day Post-Op Procedure(s) (LRB): ARTHROPLASTY, KNEE, TOTAL, USING IMAGELESS COMPUTER-ASSISTED NAVIGATION (Left) Patient reports pain as mild.   Patient is well, and has had no acute complaints or problems Plan is to go Home after hospital stay. Negative for chest pain and shortness of breath Fever: no Gastrointestinal: Negative for nausea and vomiting  Objective: Vital signs in last 24 hours: Temp:  [97 F (36.1 C)-98.5 F (36.9 C)] 97.6 F (36.4 C) (07/01 0400) Pulse Rate:  [66-80] 71 (07/01 0400) Resp:  [13-20] 16 (07/01 0400) BP: (116-135)/(76-86) 121/76 (07/01 0400) SpO2:  [96 %-100 %] 96 % (07/01 0400) Weight:  [90.7 kg] 90.7 kg (06/30 0637)  Intake/Output from previous day:  Intake/Output Summary (Last 24 hours) at 03/02/2024 0618 Last data filed at 03/02/2024 0503 Gross per 24 hour  Intake 3705 ml  Output 1085 ml  Net 2620 ml    Intake/Output this shift: Total I/O In: 1153.3 [I.V.:953.3; IV Piggyback:200] Out: -   Labs: No results for input(s): HGB in the last 72 hours. No results for input(s): WBC, RBC, HCT, PLT in the last 72 hours. No results for input(s): NA, K, CL, CO2, BUN, CREATININE, GLUCOSE, CALCIUM in the last 72 hours. No results for input(s): LABPT, INR in the last 72 hours.   EXAM General - Patient is Alert and Oriented Extremity - Neurovascular intact Sensation intact distally Dorsiflexion/Plantar flexion intact Compartment soft Dressing/Incision - clean, dry, with a Hemovac removed with no complication.  The Hemovac tubing was intact on removal. Motor Function - intact, moving foot and toes well on exam.  Able to do a straight leg raise independently.  Past Medical History:  Diagnosis Date   Anemia    Anxiety    Arthritis    Asthma    Chronic fatigue    Endometriosis    Family history of osteoporosis    GERD (gastroesophageal reflux disease)    Heart murmur    History of kidney stones     Hypertension    IBS (irritable bowel syndrome)    Obesity    Surgical menopause    Vaginal dryness     Assessment/Plan: 1 Day Post-Op Procedure(s) (LRB): ARTHROPLASTY, KNEE, TOTAL, USING IMAGELESS COMPUTER-ASSISTED NAVIGATION (Left) Principal Problem:   History of total knee arthroplasty, left  Estimated body mass index is 35.43 kg/m as calculated from the following:   Height as of this encounter: 5' 3 (1.6 m).   Weight as of this encounter: 90.7 kg. Advance diet Up with therapy D/C IV fluids Discharge home with home health  DVT Prophylaxis - Aspirin, Foot Pumps, and TED hose Weight-Bearing as tolerated to left leg  Krystal Doyne, PA-C Orthopaedic Surgery 03/02/2024, 6:18 AM

## 2024-03-02 NOTE — Progress Notes (Signed)
 Physical Therapy Treatment Patient Details Name: Latasha [name] MRN: [id number] DOB: 1960/10/13 Today's Date: 03/02/2024   History of Present Illness Pt is a 63 y.o. female s/p elected L TKA using computer assisted navigation on 03/01/24 due to chronic L lateral knee pain, limiting the pt's ability to ambulate long distances. PMH: Arthritis, Endometriosis, HTN, Osteoprosis, Anxiety, and GERD.    PT Comments  Pt is supine in bed with HOB elevated, and agreeable to therapy today. PT focused today's session on increasing ambulation distance, reviewing HEP, and increasing exercise tolerance. During session pt was able to perform bed mobility (supine to sit) with supervision, and performed LLE strengthening exercises while in bed/ EOB. Following exercise pt performed 3x trials of STS towards RW with CGA/Supervision, was able to ambulate 80 ft with RW and CGA for increased safety, and was able to ascend/descend 6 steps with hand over hand on railing with side step approach and CGA. Pt did require increased seated rest breaks between ambulation bout and stairs, but indicated that the distance ambulated today was 3x more than what she typically ambulates at baseline. PT also noted increased exercise tolerance with decreased verbalized pain compared to yesterday's session, indicative of tissue healing and improved tolerance to activity. Prior to end of session, PT educated patient on safety within the home, removing hazards off the floor, and safe car transfers. Upon d/c pt would benefit from skilled PT to continue to work towards PT goals and return to PLOF.     If plan is discharge home, recommend the following: Assistance with cooking/housework;Assist for transportation;Help with stairs or ramp for entrance;A little help with walking and/or transfers;A little help with bathing/dressing/bathroom   Can travel by private vehicle        Equipment Recommendations  None recommended by PT    Recommendations  for Other Services       Precautions / Restrictions Precautions Precautions: Fall Recall of Precautions/Restrictions: Intact Precaution/Restrictions Comments: s/p L TKA Restrictions Weight Bearing Restrictions Per Provider Order: Yes LLE Weight Bearing Per Provider Order: Weight bearing as tolerated     Mobility  Bed Mobility Overal bed mobility: Needs Assistance Bed Mobility: Supine to Sit     Supine to sit: Supervision     General bed mobility comments: Pt was able to recall sequencing and handplacement suggestions from PT session yesterday.    Transfers Overall transfer level: Needs assistance Equipment used: Rolling walker (2 wheels) Transfers: Sit to/from Stand Sit to Stand: Supervision, Contact guard assist (for increased safety)           General transfer comment: 3x trials of STS towards RW (1 trial from EOB, bed elevated, 2 trials from recliner)    Ambulation/Gait Ambulation/Gait assistance: Contact guard assist (w/c follow) Gait Distance (Feet): 80 Feet Assistive device: Rolling walker (2 wheels) Gait Pattern/deviations: Decreased step length - right, Decreased step length - left, Step-to pattern, Decreased stance time - left (oversupination of B feet, L>R) Gait velocity: decreased     General Gait Details: Gait is slow but steady, reliant on BUE on walker   Stairs Stairs: Yes Stairs assistance: Contact guard assist Stair Management: One rail Right, Sideways Number of Stairs: 6 General stair comments: Pt was able to ascend/descend 6 steps with hand over hand on railing and side step approach. Pt was able to tolerate descending with RLE first for 6 steps in a safe and efficient manner (pt's preferred method).   Wheelchair Mobility     Tilt Bed  Modified Rankin (Stroke Patients Only)       Balance Overall balance assessment: Needs assistance Sitting-balance support: Bilateral upper extremity supported, Feet supported Sitting  balance-Leahy Scale: Good Sitting balance - Comments: Steady seated balance reaching within BOS   Standing balance support: Bilateral upper extremity supported, During functional activity, Reliant on assistive device for balance Standing balance-Leahy Scale: Good Standing balance comment: Good standing balance with BUE on RW, able to keep CoM within LOS                            Communication Communication Communication: No apparent difficulties  Cognition Arousal: Alert Behavior During Therapy: WFL for tasks assessed/performed   PT - Cognitive impairments: No apparent impairments                       PT - Cognition Comments: Anxious, requires constant reasurrance Following commands: Intact      Cueing Cueing Techniques: Verbal cues, Gestural cues  Exercises Total Joint Exercises Ankle Circles/Pumps: AROM, Strengthening, Supine, Left, 10 reps Quad Sets: AROM, Strengthening, Left, 10 reps, Supine Heel Slides: AROM, Strengthening, Left, 10 reps, Supine Hip ABduction/ADduction: AROM, 10 reps, Supine, Strengthening, Left Straight Leg Raises: AROM, Left, 10 reps, Supine, Strengthening Long Arc Quad: AROM, Seated, Strengthening, Left, 10 reps Knee Flexion: AROM, Strengthening, Left, 10 reps, Seated Goniometric ROM: L knee extension: lacking 2 degrees from neutral AROM, L knee flexion: 93 degrees, AROM.    General Comments General comments (skin integrity, edema, etc.): Pre session: HR: 70 bpm, SpO2: 99% on room air,  Post session: HR: 77 bpm, SpO2: 98+% on room air. L knee surgical dressing: clean, dry and intact throughout session.      Pertinent Vitals/Pain Pain Assessment Pain Assessment: 0-10 Pain Score: 4  Pain Location: L knee (quadricep) Pain Descriptors / Indicators: Aching, Guarding, Pressure, Sore Pain Intervention(s): Limited activity within patient's tolerance, Monitored during session, Premedicated before session    Home Living                           Prior Function            PT Goals (current goals can now be found in the care plan section) Acute Rehab PT Goals Patient Stated Goal: To go home PT Goal Formulation: With patient Time For Goal Achievement: 03/15/24 Potential to Achieve Goals: Good Progress towards PT goals: Progressing toward goals    Frequency    BID      PT Plan      Co-evaluation              AM-PAC PT 6 Clicks Mobility   Outcome Measure  Help needed turning from your back to your side while in a flat bed without using bedrails?: A Little Help needed moving from lying on your back to sitting on the side of a flat bed without using bedrails?: A Little Help needed moving to and from a bed to a chair (including a wheelchair)?: A Little Help needed standing up from a chair using your arms (e.g., wheelchair or bedside chair)?: A Little Help needed to walk in hospital room?: A Little Help needed climbing 3-5 steps with a railing? : A Little 6 Click Score: 18    End of Session Equipment Utilized During Treatment: Gait belt Activity Tolerance: Patient tolerated treatment well;Patient limited by fatigue Patient left: in chair;with call bell/phone within reach;with  nursing/sitter in room,L heel floated via towel roll, OT to arrive shortly and to place polar care/SCDs at end of OT session.  Nurse Communication: Mobility status;Precautions PT Visit Diagnosis: Unsteadiness on feet (R26.81);Other abnormalities of gait and mobility (R26.89);Muscle weakness (generalized) (M62.81);Pain Pain - Right/Left: Left Pain - part of body: Knee     Time: 9081-8986 PT Time Calculation (min) (ACUTE ONLY): 55 min  Charges:                           Antionetta Ator, SPT 03/02/24, 10:53 AM

## 2024-03-02 NOTE — Progress Notes (Signed)
 DISCHARGE NOTE:  Pt given discharge instructions and verbalized understanding. 2 honeycomb and scripts given to pt.  TED hose on both legs. Pt wheeled to car by staff. Daughter providing transportation

## 2024-03-02 NOTE — Care Management Obs Status (Signed)
 MEDICARE OBSERVATION STATUS NOTIFICATION   Patient Details  Name: Latasha [name] MRN: [id number] Date of Birth: October 17, 1960   Medicare Observation Status Notification Given:  Yes    Rojelio SHAUNNA Rattler 03/02/2024, 12:24 PM

## 2024-03-02 NOTE — Plan of Care (Signed)
 Progressing towards discharge

## 2024-03-02 NOTE — Anesthesia Postprocedure Evaluation (Signed)
 Anesthesia Post Note  Patient: Latasha [name]  Procedure(s) Performed: ARTHROPLASTY, KNEE, TOTAL, USING IMAGELESS COMPUTER-ASSISTED NAVIGATION (Left: Knee)  Patient location during evaluation: Nursing Unit Anesthesia Type: Spinal Level of consciousness: awake Pain management: pain level controlled Respiratory status: spontaneous breathing Postop Assessment: no headache Anesthetic complications: no   No notable events documented.   Last Vitals:  Vitals:   03/02/24 0024 03/02/24 0400  BP: 127/84 121/76  Pulse: 76 71  Resp:  16  Temp: 36.4 C 36.4 C  SpO2: 97% 96%    Last Pain:  Vitals:   03/02/24 0358  TempSrc:   PainSc: 2                  Shona Earnie Fare

## 2024-03-02 NOTE — Evaluation (Addendum)
 Occupational Therapy Evaluation Patient Details Name: Latasha [name] MRN: [id number] DOB: 09/28/1960 Today's Date: 03/02/2024   History of Present Illness   Pt is a 63 y.o. female s/p elected L TKA using computer assisted navigation on 03/01/24 due to chronic L lateral knee pain, limiting the pt's ability to ambulate long distances. PMH: Arthritis, Endometriosis, HTN, Osteoprosis, Anxiety, and GERD.     Clinical Impressions Pt seen for OT evaluation this date, POD#1 from above surgery PTA pt grossly MOD I for ADL/IADL PTA. Pt provided education re:  polar care mgt, falls prevention strategies, home/routines modifications, DME/AE for LB bathing and dressing tasks, and compression stocking mgt. Hand out provided for carry over with pt requesting repetition for carry over as well. Pt would benefit from skilled OT services including additional instruction in dressing techniques with or without assistive devices for dressing and bathing skills to support recall and carryover prior to discharge and ultimately to maximize safety, independence, and minimize falls risk and caregiver burden. All questions answered within scope. OT will follow acutely.     If plan is discharge home, recommend the following:   A little help with walking and/or transfers;A little help with bathing/dressing/bathroom;Help with stairs or ramp for entrance;Assist for transportation;Assistance with cooking/housework     Functional Status Assessment   Patient has had a recent decline in their functional status and demonstrates the ability to make significant improvements in function in a reasonable and predictable amount of time.     Equipment Recommendations   Per chart, pt has recommended equipment      Recommendations for Other Services         Precautions/Restrictions   Precautions Precautions: Fall;Knee Recall of Precautions/Restrictions: Intact Restrictions Weight Bearing Restrictions Per Provider  Order: Yes LLE Weight Bearing Per Provider Order: Weight bearing as tolerated     Mobility Bed Mobility Overal bed mobility: Needs Assistance Bed Mobility: Sit to Supine       Sit to supine: Supervision        Transfers Overall transfer level: Needs assistance Equipment used: Rolling walker (2 wheels) Transfers: Sit to/from Stand Sit to Stand: Contact guard assist                  Balance Overall balance assessment: Needs assistance Sitting-balance support: Bilateral upper extremity supported, Feet supported Sitting balance-Leahy Scale: Good     Standing balance support: Bilateral upper extremity supported, During functional activity, Reliant on assistive device for balance Standing balance-Leahy Scale: Good                             ADL either performed or assessed with clinical judgement   ADL Overall ADL's : Needs assistance/impaired Eating/Feeding: Set up;Sitting   Grooming: Set up;Sitting   Upper Body Bathing: Minimal assistance;Sitting   Lower Body Bathing: Minimal assistance;Sit to/from stand   Upper Body Dressing : Supervision/safety;Sitting   Lower Body Dressing: Minimal assistance;Sitting/lateral leans Lower Body Dressing Details (indicate cue type and reason): MAX A for donning TED hose, MIN A for donn/doff sock, MIN A for underwear             Functional mobility during ADLs: Contact guard assist;Rolling walker (2 wheels)       Vision Patient Visual Report: No change from baseline       Perception         Praxis         Pertinent Vitals/Pain Pain Assessment Pain Assessment:  0-10 Pain Score: 5  Pain Location: L knee Pain Descriptors / Indicators: Aching, Guarding, Pressure, Sore Pain Intervention(s): Premedicated before session, Monitored during session, Repositioned, Limited activity within patient's tolerance, RN gave pain meds during session     Extremity/Trunk Assessment             Communication  Communication Communication: No apparent difficulties   Cognition Arousal: Alert Behavior During Therapy: WFL for tasks assessed/performed Cognition: No apparent impairments             OT - Cognition Comments: does request increased cueing/repetition                 Following commands: Intact       Cueing  General Comments   Cueing Techniques: Verbal cues;Gestural cues  vss throughout   Exercises     Shoulder Instructions      Home Living Family/patient expects to be discharged to:: Private residence Living Arrangements: Spouse/significant other Available Help at Discharge: Family Type of Home: House Home Access: Stairs to enter [employer] of Steps: 5 Entrance Stairs-Rails: Right;Left Home Layout: Two level;Able to live on main level with bedroom/bathroom Alternate Level Stairs-Number of Steps: flight Alternate Level Stairs-Rails: Can reach both Bathroom Shower/Tub: Tub/shower unit   Bathroom Toilet: Standard Bathroom Accessibility: Yes   Home Equipment: Agricultural consultant (2 wheels);Rollator (4 wheels);Cane - single point;Shower seat;Grab bars - tub/shower;BSC/3in1          Prior Functioning/Environment Prior Level of Function : Independent/Modified Independent             Mobility Comments: amb with RW ADLs Comments: generally MOD I for ADL/IADL    OT Problem List:     OT Treatment/Interventions: Self-care/ADL training;DME and/or AE instruction;Therapeutic activities;Balance training;Therapeutic exercise;Patient/family education      OT Goals(Current goals can be found in the care plan section)   Acute Rehab OT Goals Patient Stated Goal: improve function OT Goal Formulation: With patient Time For Goal Achievement: 03/16/24 Potential to Achieve Goals: Good ADL Goals Pt Will Perform Grooming: with modified independence;sitting;standing Pt Will Perform Lower Body Dressing: with modified independence;sitting/lateral leans;sit  to/from stand Pt Will Transfer to Toilet: with modified independence;ambulating Pt Will Perform Toileting - Clothing Manipulation and hygiene: with modified independence;sit to/from stand;sitting/lateral leans   OT Frequency:  Min 2X/week    Co-evaluation              AM-PAC OT 6 Clicks Daily Activity     Outcome Measure Help from another person eating meals?: None Help from another person taking care of personal grooming?: None Help from another person toileting, which includes using toliet, bedpan, or urinal?: None Help from another person bathing (including washing, rinsing, drying)?: A Little Help from another person to put on and taking off regular upper body clothing?: None Help from another person to put on and taking off regular lower body clothing?: A Little 6 Click Score: 22   End of Session Equipment Utilized During Treatment: Rolling walker (2 wheels) Nurse Communication: Mobility status  Activity Tolerance: Patient tolerated treatment well Patient left: in bed;with call bell/phone within reach  OT Visit Diagnosis: Other abnormalities of gait and mobility (R26.89)                Time: 8984-8948 OT Time Calculation (min): 36 min Charges:  OT General Charges $OT Visit: 1 Visit OT Evaluation $OT Eval Low Complexity: 1 Low  Therisa Sheffield, OTD OTR/L  03/02/24, 12:35 PM

## 2024-03-14 NOTE — Therapy (Incomplete)
 OUTPATIENT PHYSICAL THERAPY LOWER EXTREMITY EVALUATION   Patient Name: Latasha [name] MRN: [id number] DOB:[birth year], 63 y.o., female Today's Date: 03/14/2024  END OF SESSION:   Past Medical History:  Diagnosis Date   Anemia    Anxiety    Arthritis    Asthma    Chronic fatigue    Endometriosis    Family history of osteoporosis    GERD (gastroesophageal reflux disease)    Heart murmur    History of kidney stones    Hypertension    IBS (irritable bowel syndrome)    Obesity    Surgical menopause    Vaginal dryness    Past Surgical History:  Procedure Laterality Date   ABDOMINAL HYSTERECTOMY     tah/bso   KNEE ARTHROPLASTY Left 03/01/2024   Procedure: ARTHROPLASTY, KNEE, TOTAL, USING IMAGELESS COMPUTER-ASSISTED NAVIGATION;  Surgeon: Mardee Lynwood SQUIBB, MD;  Location: [facility];  Service: Orthopedics;  Laterality: Left;   TONSILLECTOMY  1976   TOTAL HIP ARTHROPLASTY Right 10/03/2021   Procedure: TOTAL HIP ARTHROPLASTY;  Surgeon: Mardee Lynwood SQUIBB, MD;  Location: [facility];  Service: Orthopedics;  Laterality: Right;   Patient Active Problem List   Diagnosis Date Noted   History of total knee arthroplasty, left 03/01/2024   Tubular adenoma 02/25/2024   Severe obesity (BMI 35.0-39.9) with comorbidity (HCC) 10/12/2023   Pulmonary nodule 08/21/2023   Hx of total hip arthroplasty, right 10/03/2021   De Quervain's tenosynovitis, right 09/06/2020   B12 deficiency 07/13/2020   Family history of colon cancer 12/01/2017   Benign essential hypertension 03/01/2016   Herpes zoster without complication 03/01/2016   BP (high blood pressure) 09/05/2015   Osteoporosis, post-menopausal 09/05/2015   Postsurgical menopause 09/05/2015   Class 1 obesity 09/05/2015   GERD (gastroesophageal reflux disease) 09/05/2015   Endometriosis 09/05/2015   Status post total hysterectomy and bilateral salpingo-oophorectomy 09/05/2015   Primary osteoarthritis of both knees 09/28/2014    PCP:  ***  REFERRING PROVIDER: ***  REFERRING DIAG: ***  THERAPY DIAG:  No diagnosis found.  Rationale for Evaluation and Treatment: {HABREHAB:27488}  ONSET DATE: DOS  03/01/2024  SUBJECTIVE:   SUBJECTIVE STATEMENT: Pt received PT from 04-02/2024 for bilat knee OA.  She received aquatic therapy  Pt underwent L TKA on 03/01/2024  Prehab and planning for a R knee TKR???  PERTINENT HISTORY: L TKA on 6/302025 OA R knee and L shoulder. Bilat shoulder pain R THA in 2023 HTN Osteoporosis Anxiety Anemia Chronic fatigue PAIN:  Are you having pain? {OPRCPAIN:27236}  PRECAUTIONS: {Therapy precautions:24002}  RED FLAGS: {PT Red Flags:29287}   WEIGHT BEARING RESTRICTIONS: {Yes ***/No:24003}  FALLS:  Has patient fallen in last 6 months? {fallsyesno:27318}  LIVING ENVIRONMENT: Lives with: {OPRC lives with:25569::lives with their family} Lives in: {Lives in:25570} Stairs: {opstairs:27293} Has following equipment at home: {Assistive devices:23999}  OCCUPATION: ***  PLOF: {PLOF:24004}  PATIENT GOALS: ***  NEXT MD VISIT: ***  OBJECTIVE:  Note: Objective measures were completed at Evaluation unless otherwise noted.  DIAGNOSTIC FINDINGS: ***  PATIENT SURVEYS:  {rehab surveys:24030}  COGNITION: Overall cognitive status: {cognition:24006}     SENSATION: {sensation:27233}  EDEMA:  {edema:24020}  MUSCLE LENGTH: Hamstrings: Right *** deg; Left *** deg Debby test: Right *** deg; Left *** deg  POSTURE: {posture:25561}  PALPATION: ***  LOWER EXTREMITY ROM:  {AROM/PROM:27142} ROM Right eval Left eval  Hip flexion    Hip extension    Hip abduction    Hip adduction    Hip internal rotation    Hip  external rotation    Knee flexion    Knee extension    Ankle dorsiflexion    Ankle plantarflexion    Ankle inversion    Ankle eversion     (Blank rows = not tested)  LOWER EXTREMITY MMT:  MMT Right eval Left eval  Hip flexion    Hip extension    Hip  abduction    Hip adduction    Hip internal rotation    Hip external rotation    Knee flexion    Knee extension    Ankle dorsiflexion    Ankle plantarflexion    Ankle inversion    Ankle eversion     (Blank rows = not tested)  LOWER EXTREMITY SPECIAL TESTS:  {LEspecialtests:26242}  FUNCTIONAL TESTS:  {Functional tests:24029}  GAIT: Distance walked: *** Assistive device utilized: {Assistive devices:23999} Level of assistance: {Levels of assistance:24026} Comments: ***                                                                                                                                TREATMENT DATE: ***    PATIENT EDUCATION:  Education details: *** Person educated: {Person educated:25204} Education method: {Education Method:25205} Education comprehension: {Education Comprehension:25206}  HOME EXERCISE PROGRAM: ***  ASSESSMENT:  CLINICAL IMPRESSION: Patient is a 63 y.o. female 2 weeks s/p L TKA who was seen today for physical therapy evaluation and treatment for ***.   OBJECTIVE IMPAIRMENTS: {opptimpairments:25111}.   ACTIVITY LIMITATIONS: {activitylimitations:27494}  PARTICIPATION LIMITATIONS: {participationrestrictions:25113}  PERSONAL FACTORS: {Personal factors:25162} are also affecting patient's functional outcome.   REHAB POTENTIAL: {rehabpotential:25112}  CLINICAL DECISION MAKING: {clinical decision making:25114}  EVALUATION COMPLEXITY: {Evaluation complexity:25115}   GOALS: Goals reviewed with patient? {yes/no:20286}  SHORT TERM GOALS: Target date: *** *** Baseline: Goal status: INITIAL  2.  *** Baseline:  Goal status: INITIAL  3.  *** Baseline:  Goal status: INITIAL  4.  *** Baseline:  Goal status: INITIAL  5.  *** Baseline:  Goal status: INITIAL  6.  *** Baseline:  Goal status: INITIAL  LONG TERM GOALS: Target date: ***  *** Baseline:  Goal status: INITIAL  2.  *** Baseline:  Goal status: INITIAL  3.   *** Baseline:  Goal status: INITIAL  4.  *** Baseline:  Goal status: INITIAL  5.  *** Baseline:  Goal status: INITIAL  6.  *** Baseline:  Goal status: INITIAL   PLAN:  PT FREQUENCY: {rehab frequency:25116}  PT DURATION: {rehab duration:25117}  PLANNED INTERVENTIONS: {rehab planned interventions:25118::97110-Therapeutic exercises,97530- Therapeutic [phone]- Neuromuscular re-education,97535- Self Rjmz,02859- Manual therapy}  PLAN FOR NEXT SESSION: PIERRETTE Mose Minerva, PT 03/14/2024, 2:06 PM

## 2024-03-15 ENCOUNTER — Ambulatory Visit (HOSPITAL_BASED_OUTPATIENT_CLINIC_OR_DEPARTMENT_OTHER): Attending: Student | Admitting: Physical Therapy

## 2024-03-15 DIAGNOSIS — M25562 Pain in left knee: Secondary | ICD-10-CM | POA: Diagnosis present

## 2024-03-15 DIAGNOSIS — R2689 Other abnormalities of gait and mobility: Secondary | ICD-10-CM | POA: Diagnosis present

## 2024-03-15 DIAGNOSIS — R269 Unspecified abnormalities of gait and mobility: Secondary | ICD-10-CM | POA: Diagnosis present

## 2024-03-15 DIAGNOSIS — M25561 Pain in right knee: Secondary | ICD-10-CM | POA: Insufficient documentation

## 2024-03-15 DIAGNOSIS — G8929 Other chronic pain: Secondary | ICD-10-CM | POA: Insufficient documentation

## 2024-03-15 DIAGNOSIS — R293 Abnormal posture: Secondary | ICD-10-CM | POA: Diagnosis present

## 2024-03-15 DIAGNOSIS — M25662 Stiffness of left knee, not elsewhere classified: Secondary | ICD-10-CM | POA: Diagnosis present

## 2024-03-15 DIAGNOSIS — M6281 Muscle weakness (generalized): Secondary | ICD-10-CM | POA: Insufficient documentation

## 2024-03-15 NOTE — Therapy (Unsigned)
 [facility]    OUTPATIENT PHYSICAL THERAPY LOWER EXTREMITY EVALUATION     Patient Name: Latasha [name] MRN: [id number] DOB:03-09-1961, 63 y.o., female Today's Date: 03/14/2024   END OF SESSION:   PT End of Session -     Visit Number    Number of Visits    Date for PT Re-Evaluation    Authorization Type    PT Start Time     PT Stop Time    PT Time Calculation (min)    Activity Tolerance    Behavior During Therapy            Past Medical History:  Diagnosis Date   Anemia     Anxiety     Arthritis     Asthma     Chronic fatigue     Endometriosis     Family history of osteoporosis     GERD (gastroesophageal reflux disease)     Heart murmur     History of kidney stones     Hypertension     IBS (irritable bowel syndrome)     Obesity     Surgical menopause     Vaginal dryness               Past Surgical History:  Procedure Laterality Date   ABDOMINAL HYSTERECTOMY        tah/bso   KNEE ARTHROPLASTY Left 03/01/2024    Procedure: ARTHROPLASTY, KNEE, TOTAL, USING IMAGELESS COMPUTER-ASSISTED NAVIGATION;  Surgeon: Mardee Lynwood SQUIBB, MD;  Location: [facility];  Service: Orthopedics;  Laterality: Left;   TONSILLECTOMY   1976   TOTAL HIP ARTHROPLASTY Right 10/03/2021    Procedure: TOTAL HIP ARTHROPLASTY;  Surgeon: Mardee Lynwood SQUIBB, MD;  Location: [facility];  Service: Orthopedics;  Laterality: Right;            Patient Active Problem List    Diagnosis Date Noted   History of total knee arthroplasty, left 03/01/2024   Tubular adenoma 02/25/2024   Severe obesity (BMI 35.0-39.9) with comorbidity (HCC) 10/12/2023   Pulmonary nodule 08/21/2023   Hx of total hip arthroplasty, right 10/03/2021   De Quervain's tenosynovitis, right 09/06/2020   B12 deficiency 07/13/2020   Family history of colon cancer 12/01/2017   Benign essential hypertension 03/01/2016   Herpes zoster without complication 03/01/2016   BP (high blood pressure) 09/05/2015   Osteoporosis,  post-menopausal 09/05/2015   Postsurgical menopause 09/05/2015   Class 1 obesity 09/05/2015   GERD (gastroesophageal reflux disease) 09/05/2015   Endometriosis 09/05/2015   Status post total hysterectomy and bilateral salpingo-oophorectomy 09/05/2015   Primary osteoarthritis of both knees 09/28/2014      PCP: Oneil Pinal MD    REFERRING PROVIDER: Fonda Koyanagi  PA-C    REFERRING DIAG: Left TKA    THERAPY DIAG:  No diagnosis found.   Rationale for Evaluation and Treatment: Rehabilitation   ONSET DATE: DOS  03/01/2024   SUBJECTIVE:    SUBJECTIVE STATEMENT: Pt received PT from 04-02/2024 for bilat knee OA.  She received aquatic therapy  Pt underwent L TKA on 03/01/2024.  At this plan her pain is well-controlled.  She has been at home health and done well.  She did not use the cane some at home.  She practiced using the cane at home with home health.  She is using a walker outside the house.  She has an exercise program that she has been working.  Will review the exercise program today.  Her chief complaint at  this time is difficulty sleeping.  She was advised that this is normal at this point.      PERTINENT HISTORY: L TKA on 6/302025 OA R knee and L shoulder. Bilat shoulder pain R THA in 2023 HTN Osteoporosis Anxiety Anemia Chronic fatigue PAIN:  Are you having pain? Yes: NPRS scale: 3-4 at worst  Pain location: left knee  Pain description: aching  Aggravating factors: standing and walking, sitting for too long, night time  Relieving factors: medication, Ice , rest    PRECAUTIONS: None   RED FLAGS: None           WEIGHT BEARING RESTRICTIONS: No   FALLS:  Has patient fallen in last 6 months? No   LIVING ENVIRONMENT: 5 steps into the house   OCCUPATION:  Retired    PLOF: Independent   PATIENT GOALS:  To be able to her mother son dance by October at her sons wedding/ to be able to walk    NEXT MD VISIT:  7/15    OBJECTIVE:  Note: Objective measures  were completed at Evaluation unless otherwise noted.   DIAGNOSTIC FINDINGS:  Nothing post op    PATIENT SURVEYS:  LEFS  Extreme difficulty/unable (0), Quite a bit of difficulty (1), Moderate difficulty (2), Little difficulty (3), No difficulty (4) Survey date:    Any of your usual work, housework or school activities   2. Usual hobbies, recreational or sporting activities   3. Getting into/out of the bath   4. Walking between rooms   5. Putting on socks/shoes   6. Squatting    7. Lifting an object, like a bag of groceries from the floor   8. Performing light activities around your home   9. Performing heavy activities around your home   10. Getting into/out of a car   11. Walking 2 blocks   12. Walking 1 mile   13. Going up/down 10 stairs (1 flight)   14. Standing for 1 hour   15.  sitting for 1 hour   16. Running on even ground   17. Running on uneven ground   18. Making sharp turns while running fast   19. Hopping    20. Rolling over in bed   Score total:  23/80      COGNITION: Overall cognitive status: Within functional limits for tasks assessed                               SENSATION:   EDEMA:  Circumferential: 44.5 R  48.7 L    MUSCLE LENGTH:   POSTURE: rounded shoulders and forward head   PALPATION: No unexpected TTP    LOWER EXTREMITY ROM:   Passive ROM Right eval Left eval  Hip flexion      Hip extension      Hip abduction      Hip adduction      Hip internal rotation      Hip external rotation      Knee flexion   96   Knee extension    -3  Ankle dorsiflexion      Ankle plantarflexion      Ankle inversion      Ankle eversion       (Blank rows = not tested)   LOWER EXTREMITY MMT:   MMT Right eval Left eval  Hip flexion      Hip extension      Hip abduction  Hip adduction      Hip internal rotation      Hip external rotation      Knee flexion      Knee extension      Ankle dorsiflexion      Ankle plantarflexion      Ankle  inversion      Ankle eversion       (Blank rows = not tested) Not tested 2nd to recent surgery       GAIT: Decreased left hip flexion Forward trunk flexion  Decreased left single leg                                                                                                                                  TREATMENT DATE:       PATIENT EDUCATION:  Education details: Reviewed HEP, symptom management, edema control.  Person educated: Patient Education method: Explanation, Demonstration, Tactile cues, Verbal cues, and Handouts Education comprehension: verbalized understanding, returned demonstration, verbal cues required, tactile cues required, and needs further education   HOME EXERCISE PROGRAM: Reviewed complete HEP from home health. We reviewed what exercises to emphasize.    ASSESSMENT:   CLINICAL IMPRESSION: Patient is a 63 y.o. female 2 weeks s/p L TKA who was seen today for physical therapy evaluation and treatment for decreased range of motion, strength, ability to ambulate, and general functional mobility.  She who presents with expected limitations in all above deficits.  She is using the walker at this time for primary mobility outside the house.  She has expected swelling in her knee.  She would benefit from skilled therapy to return to active lifestyle.    OBJECTIVE IMPAIRMENTS: Abnormal gait, decreased activity tolerance, decreased mobility, difficulty walking, decreased ROM, decreased strength, increased edema, increased muscle spasms, and pain.    ACTIVITY LIMITATIONS: carrying, lifting, bending, standing, squatting, stairs, transfers, bed mobility, bathing, and locomotion level   PARTICIPATION LIMITATIONS: meal prep, cleaning, laundry, driving, shopping, community activity, occupation, and yard work   PERSONAL FACTORS: 1-2 comorbidities: hip OA  are also affecting patient's functional outcome.    REHAB POTENTIAL: Good   CLINICAL DECISION MAKING:  Stable/uncomplicated   EVALUATION COMPLEXITY: Low     GOALS: Goals reviewed with patient? Yes  04/13/2024     SHORT TERM GOALS: Target date:  Patient will increase passive knee flexion to 120 degrees on the left Baseline: Goal status: INITIAL   2.  Patient will increase passive left knee extension to full Baseline:  Goal status: INITIAL   3.  Patient will progress off assistive device and ambulate greater than 500 feet without significant antalgic gait Baseline:  Goal status: INITIAL   4.  Patient will be independent with HEP Baseline:  Goal status: INITIAL     LONG TERM GOALS: Target date: 05/11/2024     Patient will go up and down 12 steps with reciprocal gait pattern without increased pain Baseline:  Goal  status: INITIAL   2.  Patient will squat pick items up off the ground without increased pain Baseline:  Goal status: INITIAL   3.  Patient will ambulate community distances without pain Baseline:  Goal status: INITIAL   4.  Patient will have complete HEP to promote strengthening Baseline:  Goal status: INITIAL     PLAN:   PT FREQUENCY: 2x/week   PT DURATION: 8 weeks   PLANNED INTERVENTIONS: 97110-Therapeutic exercises, 97530- Therapeutic activity, [id number]- Neuromuscular re-education, 97535- Self Care, [id number]- Manual therapy, [id number]- Gait training, [phone]- Aquatic Therapy, [id number]- , [id number]- Ultrasound, Patient/Family education, Stair training, Dry Needling, DME instructions, Cryotherapy, and Moist heat    PLAN FOR NEXT SESSION:   Begin with passive range of motion and edema management.  Progress home exercise program table strengthening.  Progressed to functional strengthening such as squats and steps as tolerated.  Start NuStep and exercise bike next visit.     Alm Don PT DPT  Referring diagnosis? Left TKA  Treatment diagnosis? (if different than referring diagnosis) LEft knee stiffness  Left knee pain  What was this (referring dx) caused by? [x]   Surgery []  Fall []  Ongoing issue []  Arthritis []  Other: ____________  Laterality: []  Rt [x]  Lt []  Both  Check all possible CPT codes:  *CHOOSE 10 OR LESS*    See Planned Interventions listed in the Plan section of the Evaluation.

## 2024-03-16 ENCOUNTER — Encounter (HOSPITAL_BASED_OUTPATIENT_CLINIC_OR_DEPARTMENT_OTHER): Payer: Self-pay | Admitting: Physical Therapy

## 2024-03-18 ENCOUNTER — Ambulatory Visit (HOSPITAL_BASED_OUTPATIENT_CLINIC_OR_DEPARTMENT_OTHER): Payer: Self-pay | Admitting: Physical Therapy

## 2024-03-18 ENCOUNTER — Encounter (HOSPITAL_BASED_OUTPATIENT_CLINIC_OR_DEPARTMENT_OTHER): Payer: Self-pay | Admitting: Physical Therapy

## 2024-03-18 DIAGNOSIS — M25562 Pain in left knee: Secondary | ICD-10-CM | POA: Diagnosis not present

## 2024-03-18 DIAGNOSIS — M25662 Stiffness of left knee, not elsewhere classified: Secondary | ICD-10-CM

## 2024-03-18 DIAGNOSIS — R2689 Other abnormalities of gait and mobility: Secondary | ICD-10-CM

## 2024-03-18 NOTE — Therapy (Signed)
 [facility]    OUTPATIENT PHYSICAL THERAPY LOWER EXTREMITY EVALUATION     Patient Name: Latasha [name] MRN: [id number] DOB:1961/04/12, 63 y.o., female Today's Date: 03/14/2024   END OF SESSION:   PT End of Session -     Visit Number    Number of Visits    Date for PT Re-Evaluation    Authorization Type    PT Start Time     PT Stop Time    PT Time Calculation (min)    Activity Tolerance    Behavior During Therapy            Past Medical History:  Diagnosis Date   Anemia     Anxiety     Arthritis     Asthma     Chronic fatigue     Endometriosis     Family history of osteoporosis     GERD (gastroesophageal reflux disease)     Heart murmur     History of kidney stones     Hypertension     IBS (irritable bowel syndrome)     Obesity     Surgical menopause     Vaginal dryness               Past Surgical History:  Procedure Laterality Date   ABDOMINAL HYSTERECTOMY        tah/bso   KNEE ARTHROPLASTY Left 03/01/2024    Procedure: ARTHROPLASTY, KNEE, TOTAL, USING IMAGELESS COMPUTER-ASSISTED NAVIGATION;  Surgeon: Mardee Lynwood SQUIBB, MD;  Location: [facility];  Service: Orthopedics;  Laterality: Left;   TONSILLECTOMY   1976   TOTAL HIP ARTHROPLASTY Right 10/03/2021    Procedure: TOTAL HIP ARTHROPLASTY;  Surgeon: Mardee Lynwood SQUIBB, MD;  Location: [facility];  Service: Orthopedics;  Laterality: Right;            Patient Active Problem List    Diagnosis Date Noted   History of total knee arthroplasty, left 03/01/2024   Tubular adenoma 02/25/2024   Severe obesity (BMI 35.0-39.9) with comorbidity (HCC) 10/12/2023   Pulmonary nodule 08/21/2023   Hx of total hip arthroplasty, right 10/03/2021   De Quervain's tenosynovitis, right 09/06/2020   B12 deficiency 07/13/2020   Family history of colon cancer 12/01/2017   Benign essential hypertension 03/01/2016   Herpes zoster without complication 03/01/2016   BP (high blood pressure) 09/05/2015   Osteoporosis,  post-menopausal 09/05/2015   Postsurgical menopause 09/05/2015   Class 1 obesity 09/05/2015   GERD (gastroesophageal reflux disease) 09/05/2015   Endometriosis 09/05/2015   Status post total hysterectomy and bilateral salpingo-oophorectomy 09/05/2015   Primary osteoarthritis of both knees 09/28/2014      PCP: Oneil Pinal MD    REFERRING PROVIDER: Fonda Koyanagi  PA-C    REFERRING DIAG: Left TKA    THERAPY DIAG:  No diagnosis found.   Rationale for Evaluation and Treatment: Rehabilitation   ONSET DATE: DOS  03/01/2024   SUBJECTIVE:    SUBJECTIVE STATEMENT: The patient is stressed by outside stressors and the fact she is not abel to sleep. She had a lot of burning last night trying to sleep.   Eval: Pt received PT from 04-02/2024 for bilat knee OA.  She received aquatic therapy  Pt underwent L TKA on 03/01/2024.  At this plan her pain is well-controlled.  She has been at home health and done well.  She did not use the cane some at home.  She practiced using the cane at home with home health.  She  is using a walker outside the house.  She has an exercise program that she has been working.  Will review the exercise program today.  Her chief complaint at this time is difficulty sleeping.  She was advised that this is normal at this point.      PERTINENT HISTORY: L TKA on 6/302025 OA R knee and L shoulder. Bilat shoulder pain R THA in 2023 HTN Osteoporosis Anxiety Anemia Chronic fatigue PAIN:  Are you having pain? Yes: NPRS scale: 3-4 at worst  Pain location: left knee  Pain description: aching  Aggravating factors: standing and walking, sitting for too long, night time  Relieving factors: medication, Ice , rest    PRECAUTIONS: None   RED FLAGS: None           WEIGHT BEARING RESTRICTIONS: No   FALLS:  Has patient fallen in last 6 months? No   LIVING ENVIRONMENT: 5 steps into the house   OCCUPATION:  Retired    PLOF: Independent   PATIENT GOALS:  To be able  to her mother son dance by October at her sons wedding/ to be able to walk    NEXT MD VISIT:  7/15    OBJECTIVE:  Note: Objective measures were completed at Evaluation unless otherwise noted.   DIAGNOSTIC FINDINGS:  Nothing post op    PATIENT SURVEYS:  LEFS  Extreme difficulty/unable (0), Quite a bit of difficulty (1), Moderate difficulty (2), Little difficulty (3), No difficulty (4) Survey date:    Any of your usual work, housework or school activities   2. Usual hobbies, recreational or sporting activities   3. Getting into/out of the bath   4. Walking between rooms   5. Putting on socks/shoes   6. Squatting    7. Lifting an object, like a bag of groceries from the floor   8. Performing light activities around your home   9. Performing heavy activities around your home   10. Getting into/out of a car   11. Walking 2 blocks   12. Walking 1 mile   13. Going up/down 10 stairs (1 flight)   14. Standing for 1 hour   15.  sitting for 1 hour   16. Running on even ground   17. Running on uneven ground   18. Making sharp turns while running fast   19. Hopping    20. Rolling over in bed   Score total:  23/80      COGNITION: Overall cognitive status: Within functional limits for tasks assessed                               SENSATION:   EDEMA:  Circumferential: 44.5 R  48.7 L    MUSCLE LENGTH:   POSTURE: rounded shoulders and forward head   PALPATION: No unexpected TTP    LOWER EXTREMITY ROM:   Passive ROM Right eval Left eval   Hip flexion       Hip extension       Hip abduction       Hip adduction       Hip internal rotation       Hip external rotation       Knee flexion   96  100  Knee extension    -3 -3  Ankle dorsiflexion       Ankle plantarflexion       Ankle inversion       Ankle  eversion        (Blank rows = not tested)   LOWER EXTREMITY MMT:   MMT Right eval Left eval  Hip flexion      Hip extension      Hip abduction      Hip adduction       Hip internal rotation      Hip external rotation      Knee flexion      Knee extension      Ankle dorsiflexion      Ankle plantarflexion      Ankle inversion      Ankle eversion       (Blank rows = not tested) Not tested 2nd to recent surgery       GAIT: Decreased left hip flexion Forward trunk flexion  Decreased left single leg                                                                                                                                  TREATMENT DATE:    Manual:  Edema massage Extension stretching with distraction    There-ex: PROM into flexion and extension  Quad set 2x15  SLR 2x10  SAQ 3x10   Neuro-re-ed:  Standing weight shift 3x10     PATIENT EDUCATION:  Education details: Reviewed HEP, symptom management, edema control.  Person educated: Patient Education method: Explanation, Demonstration, Tactile cues, Verbal cues, and Handouts Education comprehension: verbalized understanding, returned demonstration, verbal cues required, tactile cues required, and needs further education   HOME EXERCISE PROGRAM: Reviewed complete HEP from home health. We reviewed what exercises to emphasize.    ASSESSMENT:   CLINICAL IMPRESSION: The patient tolerated treatment well. She had been doing her exercises 3x a day.   Eval:  Patient is a 63 y.o. female 2 weeks s/p L TKA who was seen today for physical therapy evaluation and treatment for decreased range of motion, strength, ability to ambulate, and general functional mobility.  She who presents with expected limitations in all above deficits.  She is using the walker at this time for primary mobility outside the house.  She has expected swelling in her knee.  She would benefit from skilled therapy to return to active lifestyle.    OBJECTIVE IMPAIRMENTS: Abnormal gait, decreased activity tolerance, decreased mobility, difficulty walking, decreased ROM, decreased strength, increased edema, increased muscle  spasms, and pain.    ACTIVITY LIMITATIONS: carrying, lifting, bending, standing, squatting, stairs, transfers, bed mobility, bathing, and locomotion level   PARTICIPATION LIMITATIONS: meal prep, cleaning, laundry, driving, shopping, community activity, occupation, and yard work   PERSONAL FACTORS: 1-2 comorbidities: hip OA  are also affecting patient's functional outcome.    REHAB POTENTIAL: Good   CLINICAL DECISION MAKING: Stable/uncomplicated   EVALUATION COMPLEXITY: Low     GOALS: Goals reviewed with patient? Yes  04/13/2024     SHORT TERM GOALS: Target date:  Patient will increase passive knee  flexion to 120 degrees on the left Baseline: Goal status: INITIAL   2.  Patient will increase passive left knee extension to full Baseline:  Goal status: INITIAL   3.  Patient will progress off assistive device and ambulate greater than 500 feet without significant antalgic gait Baseline:  Goal status: INITIAL   4.  Patient will be independent with HEP Baseline:  Goal status: INITIAL     LONG TERM GOALS: Target date: 05/11/2024     Patient will go up and down 12 steps with reciprocal gait pattern without increased pain Baseline:  Goal status: INITIAL   2.  Patient will squat pick items up off the ground without increased pain Baseline:  Goal status: INITIAL   3.  Patient will ambulate community distances without pain Baseline:  Goal status: INITIAL   4.  Patient will have complete HEP to promote strengthening Baseline:  Goal status: INITIAL     PLAN:   PT FREQUENCY: 2x/week   PT DURATION: 8 weeks   PLANNED INTERVENTIONS: 97110-Therapeutic exercises, 97530- Therapeutic activity, [id number]- Neuromuscular re-education, 97535- Self Care, [id number]- Manual therapy, [id number]- Gait training, [phone]- Aquatic Therapy, [id number]- , [id number]- Ultrasound, Patient/Family education, Stair training, Dry Needling, DME instructions, Cryotherapy, and Moist heat    PLAN FOR NEXT SESSION:    Begin with passive range of motion and edema management.  Progress home exercise program table strengthening.  Progressed to functional strengthening such as squats and steps as tolerated.  Start NuStep and exercise bike next visit.     Alm Don PT DPT  Referring diagnosis? Left TKA  Treatment diagnosis? (if different than referring diagnosis) LEft knee stiffness  Left knee pain  What was this (referring dx) caused by? [x]  Surgery []  Fall []  Ongoing issue []  Arthritis []  Other: ____________  Laterality: []  Rt [x]  Lt []  Both  Check all possible CPT codes:  *CHOOSE 10 OR LESS*    See Planned Interventions listed in the Plan section of the Evaluation.

## 2024-03-19 ENCOUNTER — Encounter (HOSPITAL_BASED_OUTPATIENT_CLINIC_OR_DEPARTMENT_OTHER): Payer: Self-pay | Admitting: Physical Therapy

## 2024-03-22 ENCOUNTER — Encounter (HOSPITAL_BASED_OUTPATIENT_CLINIC_OR_DEPARTMENT_OTHER): Payer: Self-pay | Admitting: Physical Therapy

## 2024-03-22 ENCOUNTER — Ambulatory Visit (HOSPITAL_BASED_OUTPATIENT_CLINIC_OR_DEPARTMENT_OTHER): Admitting: Physical Therapy

## 2024-03-22 DIAGNOSIS — M25662 Stiffness of left knee, not elsewhere classified: Secondary | ICD-10-CM

## 2024-03-22 DIAGNOSIS — M25562 Pain in left knee: Secondary | ICD-10-CM | POA: Diagnosis not present

## 2024-03-22 NOTE — Therapy (Signed)
 [facility]    OUTPATIENT PHYSICAL THERAPY LOWER EXTREMITY EVALUATION     Patient Name: Latasha [name] MRN: [id number] DOB:Aug 11, 1961, 63 y.o., female Today's Date: 03/14/2024   END OF SESSION:    PT End of Session - 03/22/24 1446     Visit Number 3    Number of Visits 16    Date for PT Re-Evaluation 05/11/24    Authorization Type [employer]    Authorization Time Period 20 visits authorized from 7.14-9.13.25    Authorization - Visit Number 3    Authorization - Number of Visits 20    PT Start Time 1445    PT Stop Time 1529    PT Time Calculation (min) 44 min    Activity Tolerance Patient tolerated treatment well;Patient limited by fatigue    Behavior During Therapy WFL for tasks assessed/performed                 Past Medical History:  Diagnosis Date   Anemia     Anxiety     Arthritis     Asthma     Chronic fatigue     Endometriosis     Family history of osteoporosis     GERD (gastroesophageal reflux disease)     Heart murmur     History of kidney stones     Hypertension     IBS (irritable bowel syndrome)     Obesity     Surgical menopause     Vaginal dryness               Past Surgical History:  Procedure Laterality Date   ABDOMINAL HYSTERECTOMY        tah/bso   KNEE ARTHROPLASTY Left 03/01/2024    Procedure: ARTHROPLASTY, KNEE, TOTAL, USING IMAGELESS COMPUTER-ASSISTED NAVIGATION;  Surgeon: Mardee Lynwood SQUIBB, MD;  Location: [facility];  Service: Orthopedics;  Laterality: Left;   TONSILLECTOMY   1976   TOTAL HIP ARTHROPLASTY Right 10/03/2021    Procedure: TOTAL HIP ARTHROPLASTY;  Surgeon: Mardee Lynwood SQUIBB, MD;  Location: [facility];  Service: Orthopedics;  Laterality: Right;            Patient Active Problem List    Diagnosis Date Noted   History of total knee arthroplasty, left 03/01/2024   Tubular adenoma 02/25/2024   Severe obesity (BMI 35.0-39.9) with comorbidity (HCC) 10/12/2023   Pulmonary nodule 08/21/2023   Hx of total hip  arthroplasty, right 10/03/2021   De Quervain's tenosynovitis, right 09/06/2020   B12 deficiency 07/13/2020   Family history of colon cancer 12/01/2017   Benign essential hypertension 03/01/2016   Herpes zoster without complication 03/01/2016   BP (high blood pressure) 09/05/2015   Osteoporosis, post-menopausal 09/05/2015   Postsurgical menopause 09/05/2015   Class 1 obesity 09/05/2015   GERD (gastroesophageal reflux disease) 09/05/2015   Endometriosis 09/05/2015   Status post total hysterectomy and bilateral salpingo-oophorectomy 09/05/2015   Primary osteoarthritis of both knees 09/28/2014      PCP: Oneil Pinal MD    REFERRING PROVIDER: Fonda Koyanagi  PA-C  Hooten, Lynwood Idalia Raddle., MD (surgeon)   REFERRING DIAG: Left TKA    THERAPY DIAG:  No diagnosis found.   Rationale for Evaluation and Treatment: Rehabilitation   ONSET DATE: DOS  03/01/2024   SUBJECTIVE:    SUBJECTIVE STATEMENT: Denies elevating at home.   Eval: Pt received PT from 04-02/2024 for bilat knee OA.  She received aquatic therapy  Pt underwent L TKA on 03/01/2024.  At this  plan her pain is well-controlled.  She has been at home health and done well.  She did not use the cane some at home.  She practiced using the cane at home with home health.  She is using a walker outside the house.  She has an exercise program that she has been working.  Will review the exercise program today.  Her chief complaint at this time is difficulty sleeping.  She was advised that this is normal at this point.      PERTINENT HISTORY: L TKA on 6/302025 OA R knee and L shoulder. Bilat shoulder pain R THA in 2023 HTN Osteoporosis Anxiety Anemia Chronic fatigue PAIN:  Are you having pain? Yes: NPRS scale: 3-4 at worst  Pain location: left knee  Pain description: aching  Aggravating factors: standing and walking, sitting for too long, night time  Relieving factors: medication, Ice , rest    PRECAUTIONS: None   RED  FLAGS: None           WEIGHT BEARING RESTRICTIONS: No   FALLS:  Has patient fallen in last 6 months? No   LIVING ENVIRONMENT: 5 steps into the house   OCCUPATION:  Retired    PLOF: Independent   PATIENT GOALS:  To be able to her mother son dance by October at her sons wedding/ to be able to walk    NEXT MD VISIT:  7/15    OBJECTIVE:  Note: Objective measures were completed at Evaluation unless otherwise noted.   DIAGNOSTIC FINDINGS:  Nothing post op    PATIENT SURVEYS:  LEFS  Extreme difficulty/unable (0), Quite a bit of difficulty (1), Moderate difficulty (2), Little difficulty (3), No difficulty (4) Survey date:    Any of your usual work, housework or school activities   2. Usual hobbies, recreational or sporting activities   3. Getting into/out of the bath   4. Walking between rooms   5. Putting on socks/shoes   6. Squatting    7. Lifting an object, like a bag of groceries from the floor   8. Performing light activities around your home   9. Performing heavy activities around your home   10. Getting into/out of a car   11. Walking 2 blocks   12. Walking 1 mile   13. Going up/down 10 stairs (1 flight)   14. Standing for 1 hour   15.  sitting for 1 hour   16. Running on even ground   17. Running on uneven ground   18. Making sharp turns while running fast   19. Hopping    20. Rolling over in bed   Score total:  23/80      COGNITION: Overall cognitive status: Within functional limits for tasks assessed                               SENSATION:   EDEMA:  Circumferential: 44.5 R  48.7 L    MUSCLE LENGTH:   POSTURE: rounded shoulders and forward head   PALPATION: No unexpected TTP    LOWER EXTREMITY ROM:   Passive ROM Right eval Left eval   Hip flexion       Hip extension       Hip abduction       Hip adduction       Hip internal rotation       Hip external rotation       Knee flexion  96  100  Knee extension    -3 -3  Ankle  dorsiflexion       Ankle plantarflexion       Ankle inversion       Ankle eversion        (Blank rows = not tested)   LOWER EXTREMITY MMT:   MMT Right eval Left eval  Hip flexion      Hip extension      Hip abduction      Hip adduction      Hip internal rotation      Hip external rotation      Knee flexion      Knee extension      Ankle dorsiflexion      Ankle plantarflexion      Ankle inversion      Ankle eversion       (Blank rows = not tested) Not tested 2nd to recent surgery       GAIT: Decreased left hip flexion Forward trunk flexion  Decreased left single leg                                                                                                                                  TREATMENT DATE:     7/21 Discussed elevating and icing Seated hamstring stretch Heel drag for hamstring activation Standing glut set Mini squat to glut set Lateral weight shift Quad set, +SLR Sidelying hip abduction Discussed self rolling and rationale following valgus correction Gait with RW and upright posture   7/18 Manual:  Edema massage Extension stretching with distraction    There-ex: PROM into flexion and extension  Quad set 2x15  SLR 2x10  SAQ 3x10   Neuro-re-ed:  Standing weight shift 3x10     PATIENT EDUCATION:  Education details: Reviewed HEP, symptom management, edema control.  Person educated: Patient Education method: Explanation, Demonstration, Tactile cues, Verbal cues, and Handouts Education comprehension: verbalized understanding, returned demonstration, verbal cues required, tactile cues required, and needs further education   HOME EXERCISE PROGRAM: Access Code: [email or web address] URL: [email or web address] Date: 03/22/2024 Prepared by: Harlene Cordon  Exercises - Seated Hamstring Stretch  - 2-3 x daily - 7 x weekly - 2 sets - 5 breaths hold - Seated Heel Slide  - 5 x daily - 7 x weekly - 1 sets - 10 reps - Standing Gluteal  Sets  - 5 x daily - 7 x weekly - 1 sets - 10 reps - Standing Weight Shift Side to Side Eyes Closed  - 5 x daily - 7 x weekly - 1 sets - 10 reps - Mini Squat  - 5 x daily - 7 x weekly - 1 sets - 10 reps - Sidelying Hip Abduction  - 3 x daily - 7 x weekly - 3 sets - 10 reps   ASSESSMENT:   CLINICAL IMPRESSION: Bilateral intoeing in gait- able to improve upright posture through pelvis and  trunk in gait. Weakness in hip abductors as expected following valgus correction so exercise was added to HEP.   Eval:  Patient is a 63 y.o. female 2 weeks s/p L TKA who was seen today for physical therapy evaluation and treatment for decreased range of motion, strength, ability to ambulate, and general functional mobility.  She who presents with expected limitations in all above deficits.  She is using the walker at this time for primary mobility outside the house.  She has expected swelling in her knee.  She would benefit from skilled therapy to return to active lifestyle.    OBJECTIVE IMPAIRMENTS: Abnormal gait, decreased activity tolerance, decreased mobility, difficulty walking, decreased ROM, decreased strength, increased edema, increased muscle spasms, and pain.    ACTIVITY LIMITATIONS: carrying, lifting, bending, standing, squatting, stairs, transfers, bed mobility, bathing, and locomotion level   PARTICIPATION LIMITATIONS: meal prep, cleaning, laundry, driving, shopping, community activity, occupation, and yard work   PERSONAL FACTORS: 1-2 comorbidities: hip OA  are also affecting patient's functional outcome.    REHAB POTENTIAL: Good   CLINICAL DECISION MAKING: Stable/uncomplicated   EVALUATION COMPLEXITY: Low     GOALS: Goals reviewed with patient? Yes  04/13/2024     SHORT TERM GOALS: Target date:  Patient will increase passive knee flexion to 120 degrees on the left Baseline: Goal status: progressing 7/18    2.  Patient will increase passive left knee extension to full Baseline:   Goal status: INITIAL   3.  Patient will progress off assistive device and ambulate greater than 500 feet without significant antalgic gait Baseline:  Goal status: INITIAL   4.  Patient will be independent with HEP Baseline:  Goal status: INITIAL     LONG TERM GOALS: Target date: 05/11/2024     Patient will go up and down 12 steps with reciprocal gait pattern without increased pain Baseline:  Goal status: INITIAL   2.  Patient will squat pick items up off the ground without increased pain Baseline:  Goal status: INITIAL   3.  Patient will ambulate community distances without pain Baseline:  Goal status: INITIAL   4.  Patient will have complete HEP to promote strengthening Baseline:  Goal status: INITIAL     PLAN:   PT FREQUENCY: 2x/week   PT DURATION: 8 weeks   PLANNED INTERVENTIONS: 97110-Therapeutic exercises, 97530- Therapeutic activity, [id number]- Neuromuscular re-education, 97535- Self Care, 02859- Manual therapy, [id number]- Gait training, [phone]- Aquatic Therapy, [id number]- , [id number]- Ultrasound, Patient/Family education, Stair training, Dry Needling, DME instructions, Cryotherapy, and Moist heat    PLAN FOR NEXT SESSION:   Begin with passive range of motion and edema management.  Progress home exercise program table strengthening.  Progressed to functional strengthening such as squats and steps as tolerated.  Start NuStep and exercise bike next visit.   Cruise Baumgardner C. Yarelli Decelles PT, DPT 03/22/24 4:14 PM   Referring diagnosis? Left TKA  Treatment diagnosis? (if different than referring diagnosis) LEft knee stiffness  Left knee pain  What was this (referring dx) caused by? [x]  Surgery []  Fall []  Ongoing issue []  Arthritis []  Other: ____________  Laterality: []  Rt [x]  Lt []  Both  Check all possible CPT codes:  *CHOOSE 10 OR LESS*    See Planned Interventions listed in the Plan section of the Evaluation.

## 2024-03-23 NOTE — Therapy (Unsigned)
 [facility]    OUTPATIENT PHYSICAL THERAPY LOWER EXTREMITY EVALUATION     Patient Name: Latasha [name] MRN: [id number] DOB:1960/10/26, 62 y.o., female Today's Date: 03/14/2024   END OF SESSION:             Past Medical History:  Diagnosis Date   Anemia     Anxiety     Arthritis     Asthma     Chronic fatigue     Endometriosis     Family history of osteoporosis     GERD (gastroesophageal reflux disease)     Heart murmur     History of kidney stones     Hypertension     IBS (irritable bowel syndrome)     Obesity     Surgical menopause     Vaginal dryness               Past Surgical History:  Procedure Laterality Date   ABDOMINAL HYSTERECTOMY        tah/bso   KNEE ARTHROPLASTY Left 03/01/2024    Procedure: ARTHROPLASTY, KNEE, TOTAL, USING IMAGELESS COMPUTER-ASSISTED NAVIGATION;  Surgeon: Mardee Lynwood SQUIBB, MD;  Location: [facility];  Service: Orthopedics;  Laterality: Left;   TONSILLECTOMY   1976   TOTAL HIP ARTHROPLASTY Right 10/03/2021    Procedure: TOTAL HIP ARTHROPLASTY;  Surgeon: Mardee Lynwood SQUIBB, MD;  Location: [facility];  Service: Orthopedics;  Laterality: Right;            Patient Active Problem List    Diagnosis Date Noted   History of total knee arthroplasty, left 03/01/2024   Tubular adenoma 02/25/2024   Severe obesity (BMI 35.0-39.9) with comorbidity (HCC) 10/12/2023   Pulmonary nodule 08/21/2023   Hx of total hip arthroplasty, right 10/03/2021   De Quervain's tenosynovitis, right 09/06/2020   B12 deficiency 07/13/2020   Family history of colon cancer 12/01/2017   Benign essential hypertension 03/01/2016   Herpes zoster without complication 03/01/2016   BP (high blood pressure) 09/05/2015   Osteoporosis, post-menopausal 09/05/2015   Postsurgical menopause 09/05/2015   Class 1 obesity 09/05/2015   GERD (gastroesophageal reflux disease) 09/05/2015   Endometriosis 09/05/2015   Status post total hysterectomy and bilateral  salpingo-oophorectomy 09/05/2015   Primary osteoarthritis of both knees 09/28/2014      PCP: Oneil Pinal MD    REFERRING PROVIDER: Fonda Koyanagi  PA-C  Hooten, Lynwood Idalia Raddle., MD (surgeon)   REFERRING DIAG: Left TKA    THERAPY DIAG:  No diagnosis found.   Rationale for Evaluation and Treatment: Rehabilitation   ONSET DATE: DOS  03/01/2024   SUBJECTIVE:    SUBJECTIVE STATEMENT: Denies elevating at home.   Eval: Pt received PT from 04-02/2024 for bilat knee OA.  She received aquatic therapy  Pt underwent L TKA on 03/01/2024.  At this plan her pain is well-controlled.  She has been at home health and done well.  She did not use the cane some at home.  She practiced using the cane at home with home health.  She is using a walker outside the house.  She has an exercise program that she has been working.  Will review the exercise program today.  Her chief complaint at this time is difficulty sleeping.  She was advised that this is normal at this point.      PERTINENT HISTORY: L TKA on 6/302025 OA R knee and L shoulder. Bilat shoulder pain R THA in 2023 HTN Osteoporosis Anxiety Anemia Chronic fatigue PAIN:  Are you having pain? Yes: NPRS scale: 3-4 at worst  Pain location: left knee  Pain description: aching  Aggravating factors: standing and walking, sitting for too long, night time  Relieving factors: medication, Ice , rest    PRECAUTIONS: None   RED FLAGS: None           WEIGHT BEARING RESTRICTIONS: No   FALLS:  Has patient fallen in last 6 months? No   LIVING ENVIRONMENT: 5 steps into the house   OCCUPATION:  Retired    PLOF: Independent   PATIENT GOALS:  To be able to her mother son dance by October at her sons wedding/ to be able to walk    NEXT MD VISIT:  7/15    OBJECTIVE:  Note: Objective measures were completed at Evaluation unless otherwise noted.   DIAGNOSTIC FINDINGS:  Nothing post op    PATIENT SURVEYS:  LEFS  Extreme  difficulty/unable (0), Quite a bit of difficulty (1), Moderate difficulty (2), Little difficulty (3), No difficulty (4) Survey date:    Any of your usual work, housework or school activities   2. Usual hobbies, recreational or sporting activities   3. Getting into/out of the bath   4. Walking between rooms   5. Putting on socks/shoes   6. Squatting    7. Lifting an object, like a bag of groceries from the floor   8. Performing light activities around your home   9. Performing heavy activities around your home   10. Getting into/out of a car   11. Walking 2 blocks   12. Walking 1 mile   13. Going up/down 10 stairs (1 flight)   14. Standing for 1 hour   15.  sitting for 1 hour   16. Running on even ground   17. Running on uneven ground   18. Making sharp turns while running fast   19. Hopping    20. Rolling over in bed   Score total:  23/80      COGNITION: Overall cognitive status: Within functional limits for tasks assessed                               SENSATION:   EDEMA:  Circumferential: 44.5 R  48.7 L    MUSCLE LENGTH:   POSTURE: rounded shoulders and forward head   PALPATION: No unexpected TTP    LOWER EXTREMITY ROM:   Passive ROM Right eval Left eval   Hip flexion       Hip extension       Hip abduction       Hip adduction       Hip internal rotation       Hip external rotation       Knee flexion   96  100  Knee extension    -3 -3  Ankle dorsiflexion       Ankle plantarflexion       Ankle inversion       Ankle eversion        (Blank rows = not tested)   LOWER EXTREMITY MMT:   MMT Right eval Left eval  Hip flexion      Hip extension      Hip abduction      Hip adduction      Hip internal rotation      Hip external rotation      Knee flexion      Knee  extension      Ankle dorsiflexion      Ankle plantarflexion      Ankle inversion      Ankle eversion       (Blank rows = not tested) Not tested 2nd to recent surgery        GAIT: Decreased left hip flexion Forward trunk flexion  Decreased left single leg                                                                                                                                  TREATMENT DATE:     7/21 Discussed elevating and icing Seated hamstring stretch Heel drag for hamstring activation Standing glut set Mini squat to glut set Lateral weight shift Quad set, +SLR Sidelying hip abduction Discussed self rolling and rationale following valgus correction Gait with RW and upright posture   7/18 Manual:  Edema massage Extension stretching with distraction    There-ex: PROM into flexion and extension  Quad set 2x15  SLR 2x10  SAQ 3x10   Neuro-re-ed:  Standing weight shift 3x10     PATIENT EDUCATION:  Education details: Reviewed HEP, symptom management, edema control.  Person educated: Patient Education method: Explanation, Demonstration, Tactile cues, Verbal cues, and Handouts Education comprehension: verbalized understanding, returned demonstration, verbal cues required, tactile cues required, and needs further education   HOME EXERCISE PROGRAM: Access Code: [email or web address] URL: [email or web address] Date: 03/22/2024 Prepared by: Harlene Cordon  Exercises - Seated Hamstring Stretch  - 2-3 x daily - 7 x weekly - 2 sets - 5 breaths hold - Seated Heel Slide  - 5 x daily - 7 x weekly - 1 sets - 10 reps - Standing Gluteal Sets  - 5 x daily - 7 x weekly - 1 sets - 10 reps - Standing Weight Shift Side to Side Eyes Closed  - 5 x daily - 7 x weekly - 1 sets - 10 reps - Mini Squat  - 5 x daily - 7 x weekly - 1 sets - 10 reps - Sidelying Hip Abduction  - 3 x daily - 7 x weekly - 3 sets - 10 reps   ASSESSMENT:   CLINICAL IMPRESSION: Bilateral intoeing in gait- able to improve upright posture through pelvis and trunk in gait. Weakness in hip abductors as expected following valgus correction so exercise was added to HEP.   Eval:   Patient is a 63 y.o. female 2 weeks s/p L TKA who was seen today for physical therapy evaluation and treatment for decreased range of motion, strength, ability to ambulate, and general functional mobility.  She who presents with expected limitations in all above deficits.  She is using the walker at this time for primary mobility outside the house.  She has expected swelling in her knee.  She would benefit from skilled therapy to return to active lifestyle.    OBJECTIVE IMPAIRMENTS: Abnormal gait, decreased activity tolerance, decreased mobility, difficulty  walking, decreased ROM, decreased strength, increased edema, increased muscle spasms, and pain.    ACTIVITY LIMITATIONS: carrying, lifting, bending, standing, squatting, stairs, transfers, bed mobility, bathing, and locomotion level   PARTICIPATION LIMITATIONS: meal prep, cleaning, laundry, driving, shopping, community activity, occupation, and yard work   PERSONAL FACTORS: 1-2 comorbidities: hip OA  are also affecting patient's functional outcome.    REHAB POTENTIAL: Good   CLINICAL DECISION MAKING: Stable/uncomplicated   EVALUATION COMPLEXITY: Low     GOALS: Goals reviewed with patient? Yes  04/13/2024     SHORT TERM GOALS: Target date:  Patient will increase passive knee flexion to 120 degrees on the left Baseline: Goal status: progressing 7/18    2.  Patient will increase passive left knee extension to full Baseline:  Goal status: INITIAL   3.  Patient will progress off assistive device and ambulate greater than 500 feet without significant antalgic gait Baseline:  Goal status: INITIAL   4.  Patient will be independent with HEP Baseline:  Goal status: INITIAL     LONG TERM GOALS: Target date: 05/11/2024     Patient will go up and down 12 steps with reciprocal gait pattern without increased pain Baseline:  Goal status: INITIAL   2.  Patient will squat pick items up off the ground without increased  pain Baseline:  Goal status: INITIAL   3.  Patient will ambulate community distances without pain Baseline:  Goal status: INITIAL   4.  Patient will have complete HEP to promote strengthening Baseline:  Goal status: INITIAL     PLAN:   PT FREQUENCY: 2x/week   PT DURATION: 8 weeks   PLANNED INTERVENTIONS: 97110-Therapeutic exercises, 97530- Therapeutic activity, [id number]- Neuromuscular re-education, 97535- Self Care, 02859- Manual therapy, [id number]- Gait training, [id number]- Aquatic Therapy, [id number]- , [id number]- Ultrasound, Patient/Family education, Stair training, Dry Needling, DME instructions, Cryotherapy, and Moist heat    PLAN FOR NEXT SESSION:   Begin with passive range of motion and edema management.  Progress home exercise program table strengthening.  Progressed to functional strengthening such as squats and steps as tolerated.  Start NuStep and exercise bike next visit.  Rojean Batten PT, DPT 03/23/24  2:48 PM    Referring diagnosis? Left TKA  Treatment diagnosis? (if different than referring diagnosis) LEft knee stiffness  Left knee pain  What was this (referring dx) caused by? [x]  Surgery []  Fall []  Ongoing issue []  Arthritis []  Other: ____________  Laterality: []  Rt [x]  Lt []  Both  Check all possible CPT codes:  *CHOOSE 10 OR LESS*    See Planned Interventions listed in the Plan section of the Evaluation.

## 2024-03-25 ENCOUNTER — Encounter (HOSPITAL_BASED_OUTPATIENT_CLINIC_OR_DEPARTMENT_OTHER): Payer: Self-pay | Admitting: Physical Therapy

## 2024-03-25 ENCOUNTER — Ambulatory Visit (HOSPITAL_BASED_OUTPATIENT_CLINIC_OR_DEPARTMENT_OTHER): Admitting: Physical Therapy

## 2024-03-25 DIAGNOSIS — R293 Abnormal posture: Secondary | ICD-10-CM

## 2024-03-25 DIAGNOSIS — M25562 Pain in left knee: Secondary | ICD-10-CM | POA: Diagnosis not present

## 2024-03-25 DIAGNOSIS — G8929 Other chronic pain: Secondary | ICD-10-CM

## 2024-03-25 DIAGNOSIS — M6281 Muscle weakness (generalized): Secondary | ICD-10-CM

## 2024-03-25 DIAGNOSIS — M25662 Stiffness of left knee, not elsewhere classified: Secondary | ICD-10-CM

## 2024-03-25 DIAGNOSIS — R2689 Other abnormalities of gait and mobility: Secondary | ICD-10-CM

## 2024-03-25 DIAGNOSIS — R269 Unspecified abnormalities of gait and mobility: Secondary | ICD-10-CM

## 2024-03-29 ENCOUNTER — Ambulatory Visit (HOSPITAL_BASED_OUTPATIENT_CLINIC_OR_DEPARTMENT_OTHER): Admitting: Physical Therapy

## 2024-03-29 DIAGNOSIS — M25562 Pain in left knee: Secondary | ICD-10-CM | POA: Diagnosis not present

## 2024-03-29 DIAGNOSIS — R2689 Other abnormalities of gait and mobility: Secondary | ICD-10-CM

## 2024-03-29 DIAGNOSIS — M6281 Muscle weakness (generalized): Secondary | ICD-10-CM

## 2024-03-29 NOTE — Therapy (Signed)
 [facility]    OUTPATIENT PHYSICAL THERAPY LOWER EXTREMITY EVALUATION     Patient Name: Latasha [name] MRN: [id number] DOB:[birth year], 63 y.o., female Today's Date: 03/14/2024   END OF SESSION:              Past Medical History:  Diagnosis Date   Anemia     Anxiety     Arthritis     Asthma     Chronic fatigue     Endometriosis     Family history of osteoporosis     GERD (gastroesophageal reflux disease)     Heart murmur     History of kidney stones     Hypertension     IBS (irritable bowel syndrome)     Obesity     Surgical menopause     Vaginal dryness               Past Surgical History:  Procedure Laterality Date   ABDOMINAL HYSTERECTOMY        tah/bso   KNEE ARTHROPLASTY Left 03/01/2024    Procedure: ARTHROPLASTY, KNEE, TOTAL, USING IMAGELESS COMPUTER-ASSISTED NAVIGATION;  Surgeon: Mardee Lynwood SQUIBB, MD;  Location: [facility];  Service: Orthopedics;  Laterality: Left;   TONSILLECTOMY   1976   TOTAL HIP ARTHROPLASTY Right 10/03/2021    Procedure: TOTAL HIP ARTHROPLASTY;  Surgeon: Mardee Lynwood SQUIBB, MD;  Location: [facility];  Service: Orthopedics;  Laterality: Right;            Patient Active Problem List    Diagnosis Date Noted   History of total knee arthroplasty, left 03/01/2024   Tubular adenoma 02/25/2024   Severe obesity (BMI 35.0-39.9) with comorbidity (HCC) 10/12/2023   Pulmonary nodule 08/21/2023   Hx of total hip arthroplasty, right 10/03/2021   De Quervain's tenosynovitis, right 09/06/2020   B12 deficiency 07/13/2020   Family history of colon cancer 12/01/2017   Benign essential hypertension 03/01/2016   Herpes zoster without complication 03/01/2016   BP (high blood pressure) 09/05/2015   Osteoporosis, post-menopausal 09/05/2015   Postsurgical menopause 09/05/2015   Class 1 obesity 09/05/2015   GERD (gastroesophageal reflux disease) 09/05/2015   Endometriosis 09/05/2015   Status post total hysterectomy and bilateral  salpingo-oophorectomy 09/05/2015   Primary osteoarthritis of both knees 09/28/2014      PCP: Oneil Pinal MD    REFERRING PROVIDER: Fonda Koyanagi  PA-C  Hooten, Lynwood Idalia Raddle., MD (surgeon)   REFERRING DIAG: Left TKA    THERAPY DIAG:  No diagnosis found.   Rationale for Evaluation and Treatment: Rehabilitation   ONSET DATE: DOS  03/01/2024   SUBJECTIVE:    SUBJECTIVE STATEMENT: Pt denies doing exercises the last few days as she was not feeling well.   Eval: Pt received PT from 04-02/2024 for bilat knee OA.  She received aquatic therapy  Pt underwent L TKA on 03/01/2024.  At this plan her pain is well-controlled.  She has been at home health and done well.  She did not use the cane some at home.  She practiced using the cane at home with home health.  She is using a walker outside the house.  She has an exercise program that she has been working.  Will review the exercise program today.  Her chief complaint at this time is difficulty sleeping.  She was advised that this is normal at this point.      PERTINENT HISTORY: L TKA on 6/302025 OA R knee and L shoulder. Bilat shoulder pain R  THA in 2023 HTN Osteoporosis Anxiety Anemia Chronic fatigue PAIN:  Are you having pain? Yes: NPRS scale: 3-4 at worst  Pain location: left knee  Pain description: aching  Aggravating factors: standing and walking, sitting for too long, night time  Relieving factors: medication, Ice , rest    PRECAUTIONS: None   RED FLAGS: None           WEIGHT BEARING RESTRICTIONS: No   FALLS:  Has patient fallen in last 6 months? No   LIVING ENVIRONMENT: 5 steps into the house   OCCUPATION:  Retired    PLOF: Independent   PATIENT GOALS:  To be able to her mother son dance by October at her sons wedding/ to be able to walk    NEXT MD VISIT:  7/15    OBJECTIVE:  Note: Objective measures were completed at Evaluation unless otherwise noted.   DIAGNOSTIC FINDINGS:  Nothing post op     PATIENT SURVEYS:  LEFS  Extreme difficulty/unable (0), Quite a bit of difficulty (1), Moderate difficulty (2), Little difficulty (3), No difficulty (4) Survey date:    Any of your usual work, housework or school activities   2. Usual hobbies, recreational or sporting activities   3. Getting into/out of the bath   4. Walking between rooms   5. Putting on socks/shoes   6. Squatting    7. Lifting an object, like a bag of groceries from the floor   8. Performing light activities around your home   9. Performing heavy activities around your home   10. Getting into/out of a car   11. Walking 2 blocks   12. Walking 1 mile   13. Going up/down 10 stairs (1 flight)   14. Standing for 1 hour   15.  sitting for 1 hour   16. Running on even ground   17. Running on uneven ground   18. Making sharp turns while running fast   19. Hopping    20. Rolling over in bed   Score total:  23/80      COGNITION: Overall cognitive status: Within functional limits for tasks assessed                               SENSATION:   EDEMA:  Circumferential: 44.5 R  48.7 L    MUSCLE LENGTH:   POSTURE: rounded shoulders and forward head   PALPATION: No unexpected TTP    LOWER EXTREMITY ROM:   Passive ROM Right eval Left eval   Hip flexion       Hip extension       Hip abduction       Hip adduction       Hip internal rotation       Hip external rotation       Knee flexion   96  100  Knee extension    -3 -3  Ankle dorsiflexion       Ankle plantarflexion       Ankle inversion       Ankle eversion        (Blank rows = not tested)   LOWER EXTREMITY MMT:   MMT Right eval Left eval  Hip flexion      Hip extension      Hip abduction      Hip adduction      Hip internal rotation      Hip external rotation  Knee flexion      Knee extension      Ankle dorsiflexion      Ankle plantarflexion      Ankle inversion      Ankle eversion       (Blank rows = not tested) Not tested 2nd to  recent surgery       GAIT: Decreased left hip flexion Forward trunk flexion  Decreased left single leg                                                                                                                                  TREATMENT DATE:   07/28 Manual:Manual:  Edema massage Extension stretching with distraction    There-ex:  SLR 3x10 RPE of 3 not increased 2nd to sciatica    07/24 - Nustep lvl 4, 5 min - Quad set, +SLR - Heel drag for hamstring activation, with 5 second hold in flexion - Sidelying hip abduction, bilat  - Sit to stand with glute squeeze and momentum strategy to get into standing.  - Standing TKE against Black TB - Seated marches  - Lateral weight shifts with 2 second holds.  - Tandem weight shifts  - Step up alternating LE 4    7/21 Discussed elevating and icing Seated hamstring stretch Heel drag for hamstring activation Standing glut set Mini squat to glut set Lateral weight shift Quad set, +SLR Sidelying hip abduction Discussed self rolling and rationale following valgus correction Gait with RW and upright posture   7/18 Manual:  Edema massage Extension stretching with distraction    There-ex: PROM into flexion and extension  Quad set 2x15  SLR 2x10  SAQ 3x10   Neuro-re-ed:  Standing weight shift 3x10     PATIENT EDUCATION:  Education details: Reviewed HEP, symptom management, edema control.  Person educated: Patient Education method: Explanation, Demonstration, Tactile cues, Verbal cues, and Handouts Education comprehension: verbalized understanding, returned demonstration, verbal cues required, tactile cues required, and needs further education   HOME EXERCISE PROGRAM: Access Code: [email or web address] URL: [email or web address] Date: 03/22/2024 Prepared by: Harlene Cordon  Exercises - Seated Hamstring Stretch  - 2-3 x daily - 7 x weekly - 2 sets - 5 breaths hold - Seated Heel Slide  - 5 x daily - 7 x weekly  - 1 sets - 10 reps - Standing Gluteal Sets  - 5 x daily - 7 x weekly - 1 sets - 10 reps - Standing Weight Shift Side to Side Eyes Closed  - 5 x daily - 7 x weekly - 1 sets - 10 reps - Mini Squat  - 5 x daily - 7 x weekly - 1 sets - 10 reps - Sidelying Hip Abduction  - 3 x daily - 7 x weekly - 3 sets - 10 reps   ASSESSMENT:   CLINICAL IMPRESSION: Pt tolerated session fairly today, she fatigued very quickly requiring lots of rest breaks. Pt demonstrated SOB,  but is able to recover with a seated rest break. Pt was able to progress with strengthening exercises today despite fatigue. She denies any pain. Pt will continue to benefit from skilled PT to address continued deficits.   Eval:  Patient is a 63 y.o. female 2 weeks s/p L TKA who was seen today for physical therapy evaluation and treatment for decreased range of motion, strength, ability to ambulate, and general functional mobility.  She who presents with expected limitations in all above deficits.  She is using the walker at this time for primary mobility outside the house.  She has expected swelling in her knee.  She would benefit from skilled therapy to return to active lifestyle.    OBJECTIVE IMPAIRMENTS: Abnormal gait, decreased activity tolerance, decreased mobility, difficulty walking, decreased ROM, decreased strength, increased edema, increased muscle spasms, and pain.    ACTIVITY LIMITATIONS: carrying, lifting, bending, standing, squatting, stairs, transfers, bed mobility, bathing, and locomotion level   PARTICIPATION LIMITATIONS: meal prep, cleaning, laundry, driving, shopping, community activity, occupation, and yard work   PERSONAL FACTORS: 1-2 comorbidities: hip OA  are also affecting patient's functional outcome.    REHAB POTENTIAL: Good   CLINICAL DECISION MAKING: Stable/uncomplicated   EVALUATION COMPLEXITY: Low     GOALS: Goals reviewed with patient? Yes  04/13/2024     SHORT TERM GOALS: Target date:  Patient  will increase passive knee flexion to 120 degrees on the left Baseline: Goal status: progressing 7/18    2.  Patient will increase passive left knee extension to full Baseline:  Goal status: INITIAL   3.  Patient will progress off assistive device and ambulate greater than 500 feet without significant antalgic gait Baseline:  Goal status: INITIAL   4.  Patient will be independent with HEP Baseline:  Goal status: INITIAL     LONG TERM GOALS: Target date: 05/11/2024     Patient will go up and down 12 steps with reciprocal gait pattern without increased pain Baseline:  Goal status: INITIAL   2.  Patient will squat pick items up off the ground without increased pain Baseline:  Goal status: INITIAL   3.  Patient will ambulate community distances without pain Baseline:  Goal status: INITIAL   4.  Patient will have complete HEP to promote strengthening Baseline:  Goal status: INITIAL     PLAN:   PT FREQUENCY: 2x/week   PT DURATION: 8 weeks   PLANNED INTERVENTIONS: 97110-Therapeutic exercises, 97530- Therapeutic activity, [id number]- Neuromuscular re-education, 97535- Self Care, 02859- Manual therapy, [id number]- Gait training, [id number]- Aquatic Therapy, [id number]- , [id number]- Ultrasound, Patient/Family education, Stair training, Dry Needling, DME instructions, Cryotherapy, and Moist heat    PLAN FOR NEXT SESSION:   Begin with passive range of motion and edema management.  Progress home exercise program table strengthening.  Progressed to functional strengthening such as squats and steps as tolerated.  Start NuStep and exercise bike next visit.  Rojean Batten PT, DPT 03/29/24  1:51 PM    Referring diagnosis? Left TKA  Treatment diagnosis? (if different than referring diagnosis) LEft knee stiffness  Left knee pain  What was this (referring dx) caused by? [x]  Surgery []  Fall []  Ongoing issue []  Arthritis []  Other: ____________  Laterality: []  Rt [x]  Lt []  Both  Check all possible  CPT codes:  *CHOOSE 10 OR LESS*    See Planned Interventions listed in the Plan section of the Evaluation.

## 2024-03-30 ENCOUNTER — Encounter (HOSPITAL_BASED_OUTPATIENT_CLINIC_OR_DEPARTMENT_OTHER): Payer: Self-pay | Admitting: Physical Therapy

## 2024-04-01 ENCOUNTER — Ambulatory Visit (HOSPITAL_BASED_OUTPATIENT_CLINIC_OR_DEPARTMENT_OTHER)

## 2024-04-02 ENCOUNTER — Ambulatory Visit (HOSPITAL_BASED_OUTPATIENT_CLINIC_OR_DEPARTMENT_OTHER): Attending: Student

## 2024-04-02 ENCOUNTER — Encounter (HOSPITAL_BASED_OUTPATIENT_CLINIC_OR_DEPARTMENT_OTHER): Payer: Self-pay

## 2024-04-02 ENCOUNTER — Encounter (HOSPITAL_BASED_OUTPATIENT_CLINIC_OR_DEPARTMENT_OTHER): Admitting: Physical Therapy

## 2024-04-02 DIAGNOSIS — M25562 Pain in left knee: Secondary | ICD-10-CM | POA: Insufficient documentation

## 2024-04-02 DIAGNOSIS — M6281 Muscle weakness (generalized): Secondary | ICD-10-CM | POA: Diagnosis present

## 2024-04-02 DIAGNOSIS — R2689 Other abnormalities of gait and mobility: Secondary | ICD-10-CM | POA: Diagnosis present

## 2024-04-02 NOTE — Therapy (Signed)
 [facility]    OUTPATIENT PHYSICAL THERAPY LOWER EXTREMITY TREATMENT     Patient Name: Latasha [name] MRN: [id number] DOB:May 16, 1961, 63 y.o., female Today's Date: 03/14/2024   END OF SESSION:    PT End of Session - 04/02/24 1359     Visit Number 6    Number of Visits 16    Date for PT Re-Evaluation 05/11/24    Authorization Type [employer]    Authorization Time Period 20 visits authorized from [id number]    Authorization - Visit Number 5    Authorization - Number of Visits 20    PT Start Time 1355   Pt arrived late   PT Stop Time 1430    PT Time Calculation (min) 35 min    Activity Tolerance Patient tolerated treatment well    Behavior During Therapy WFL for tasks assessed/performed                    Past Medical History:  Diagnosis Date   Anemia     Anxiety     Arthritis     Asthma     Chronic fatigue     Endometriosis     Family history of osteoporosis     GERD (gastroesophageal reflux disease)     Heart murmur     History of kidney stones     Hypertension     IBS (irritable bowel syndrome)     Obesity     Surgical menopause     Vaginal dryness               Past Surgical History:  Procedure Laterality Date   ABDOMINAL HYSTERECTOMY        tah/bso   KNEE ARTHROPLASTY Left 03/01/2024    Procedure: ARTHROPLASTY, KNEE, TOTAL, USING IMAGELESS COMPUTER-ASSISTED NAVIGATION;  Surgeon: Mardee Lynwood SQUIBB, MD;  Location: [facility];  Service: Orthopedics;  Laterality: Left;   TONSILLECTOMY   1976   TOTAL HIP ARTHROPLASTY Right 10/03/2021    Procedure: TOTAL HIP ARTHROPLASTY;  Surgeon: Mardee Lynwood SQUIBB, MD;  Location: [facility];  Service: Orthopedics;  Laterality: Right;            Patient Active Problem List    Diagnosis Date Noted   History of total knee arthroplasty, left 03/01/2024   Tubular adenoma 02/25/2024   Severe obesity (BMI 35.0-39.9) with comorbidity (HCC) 10/12/2023   Pulmonary nodule 08/21/2023   Hx of total hip  arthroplasty, right 10/03/2021   De Quervain's tenosynovitis, right 09/06/2020   B12 deficiency 07/13/2020   Family history of colon cancer 12/01/2017   Benign essential hypertension 03/01/2016   Herpes zoster without complication 03/01/2016   BP (high blood pressure) 09/05/2015   Osteoporosis, post-menopausal 09/05/2015   Postsurgical menopause 09/05/2015   Class 1 obesity 09/05/2015   GERD (gastroesophageal reflux disease) 09/05/2015   Endometriosis 09/05/2015   Status post total hysterectomy and bilateral salpingo-oophorectomy 09/05/2015   Primary osteoarthritis of both knees 09/28/2014      PCP: Oneil Pinal MD    REFERRING PROVIDER: Fonda Koyanagi  PA-C  Hooten, Lynwood Idalia Raddle., MD (surgeon)   REFERRING DIAG: Left TKA    THERAPY DIAG:  No diagnosis found.   Rationale for Evaluation and Treatment: Rehabilitation   ONSET DATE: DOS  03/01/2024   SUBJECTIVE:    SUBJECTIVE STATEMENT: Pt reports she had labs a few days ago and saw MD today. Iron is low and going on iron and potassium supplements 2x/day. Has been experiencing  fatigue and sluggishness lately with decreased motivation for activity. Hopefully this helps this my energy level.  Eval: Pt received PT from 04-02/2024 for bilat knee OA.  She received aquatic therapy  Pt underwent L TKA on 03/01/2024.  At this plan her pain is well-controlled.  She has been at home health and done well.  She did not use the cane some at home.  She practiced using the cane at home with home health.  She is using a walker outside the house.  She has an exercise program that she has been working.  Will review the exercise program today.  Her chief complaint at this time is difficulty sleeping.  She was advised that this is normal at this point.      PERTINENT HISTORY: L TKA on 6/302025 OA R knee and L shoulder. Bilat shoulder pain R THA in 2023 HTN Osteoporosis Anxiety Anemia Chronic fatigue PAIN:  Are you having pain? Yes: NPRS  scale: 3-4 at worst  Pain location: left knee  Pain description: aching  Aggravating factors: standing and walking, sitting for too long, night time  Relieving factors: medication, Ice , rest    PRECAUTIONS: None   RED FLAGS: None           WEIGHT BEARING RESTRICTIONS: No   FALLS:  Has patient fallen in last 6 months? No   LIVING ENVIRONMENT: 5 steps into the house   OCCUPATION:  Retired    PLOF: Independent   PATIENT GOALS:  To be able to her mother son dance by October at her sons wedding/ to be able to walk    NEXT MD VISIT:  7/15    OBJECTIVE:  Note: Objective measures were completed at Evaluation unless otherwise noted.   DIAGNOSTIC FINDINGS:  Nothing post op    PATIENT SURVEYS:  LEFS  Extreme difficulty/unable (0), Quite a bit of difficulty (1), Moderate difficulty (2), Little difficulty (3), No difficulty (4) Survey date:    Any of your usual work, housework or school activities   2. Usual hobbies, recreational or sporting activities   3. Getting into/out of the bath   4. Walking between rooms   5. Putting on socks/shoes   6. Squatting    7. Lifting an object, like a bag of groceries from the floor   8. Performing light activities around your home   9. Performing heavy activities around your home   10. Getting into/out of a car   11. Walking 2 blocks   12. Walking 1 mile   13. Going up/down 10 stairs (1 flight)   14. Standing for 1 hour   15.  sitting for 1 hour   16. Running on even ground   17. Running on uneven ground   18. Making sharp turns while running fast   19. Hopping    20. Rolling over in bed   Score total:  23/80      COGNITION: Overall cognitive status: Within functional limits for tasks assessed                               SENSATION:   EDEMA:  Circumferential: 44.5 R  48.7 L    MUSCLE LENGTH:   POSTURE: rounded shoulders and forward head   PALPATION: No unexpected TTP    LOWER EXTREMITY ROM:   Passive ROM  Right eval Left eval left left  Hip flexion  Hip extension        Hip abduction        Hip adduction        Hip internal rotation        Hip external rotation        Knee flexion   96  100 103  Knee extension    -3 -3   Ankle dorsiflexion        Ankle plantarflexion        Ankle inversion        Ankle eversion         (Blank rows = not tested)   LOWER EXTREMITY MMT:   MMT Right eval Left eval  Hip flexion      Hip extension      Hip abduction      Hip adduction      Hip internal rotation      Hip external rotation      Knee flexion      Knee extension      Ankle dorsiflexion      Ankle plantarflexion      Ankle inversion      Ankle eversion       (Blank rows = not tested) Not tested 2nd to recent surgery       GAIT: Decreased left hip flexion Forward trunk flexion  Decreased left single leg                                                                                                                                  TREATMENT DATE:    8/1 PROM L knee  Measured 115 passive flexion Roller to quad  SAQ 2.5# 3 2x10 LAQ 3 2x10 STS from plinth x10 Gait with SPC x243ft Nu-step L3   07/28 Manual:Manual:  Edema massage Extension stretching with distraction    There-ex:  SLR 3x10 RPE of 3 not increased 2nd to sciatica  SAQ 3x10  Quad set 3x15   Neuro-re-ed:  Weight shift on scale with cuing to stay equal  3x10   Gait:  Ambulation with single poit cane 200'  Good technique noted More valgus on the right side.  Good stability with no loss of balance  SBA with gait belt   07/24 - Nustep lvl 4, 5 min - Quad set, +SLR - Heel drag for hamstring activation, with 5 second hold in flexion - Sidelying hip abduction, bilat  - Sit to stand with glute squeeze and momentum strategy to get into standing.  - Standing TKE against Black TB - Seated marches  - Lateral weight shifts with 2 second holds.  - Tandem weight shifts  - Step up  alternating LE 4    7/21 Discussed elevating and icing Seated hamstring stretch Heel drag for hamstring activation Standing glut set Mini squat to glut set Lateral weight shift Quad set, +SLR Sidelying hip abduction Discussed self rolling and rationale following valgus  correction Gait with RW and upright posture   7/18 Manual:  Edema massage Extension stretching with distraction    There-ex: PROM into flexion and extension  Quad set 2x15  SLR 2x10  SAQ 3x10   Neuro-re-ed:  Standing weight shift 3x10     PATIENT EDUCATION:  Education details: Reviewed HEP, symptom management, edema control.  Person educated: Patient Education method: Explanation, Demonstration, Tactile cues, Verbal cues, and Handouts Education comprehension: verbalized understanding, returned demonstration, verbal cues required, tactile cues required, and needs further education   HOME EXERCISE PROGRAM: Access Code: [email or web address] URL: [email or web address] Date: 03/22/2024 Prepared by: Harlene Cordon  Exercises - Seated Hamstring Stretch  - 2-3 x daily - 7 x weekly - 2 sets - 5 breaths hold - Seated Heel Slide  - 5 x daily - 7 x weekly - 1 sets - 10 reps - Standing Gluteal Sets  - 5 x daily - 7 x weekly - 1 sets - 10 reps - Standing Weight Shift Side to Side Eyes Closed  - 5 x daily - 7 x weekly - 1 sets - 10 reps - Mini Squat  - 5 x daily - 7 x weekly - 1 sets - 10 reps - Sidelying Hip Abduction  - 3 x daily - 7 x weekly - 3 sets - 10 reps   ASSESSMENT:   CLINICAL IMPRESSION: Tx time limited due to late arrival. Pt 5 weeks s/p at this time. Removed steri strips that were appropriate to remove at this time. Pt measured at 115deg passive knee flexion today and demonstrates full extension.  Worked on sit to stands this patient reports having difficulty with this at home and in public.  Able to tolerate addition of ankle weight to SAQ without complaint.  Practiced walking with SPC to  observe form and stability.  Patient demonstrates safe performance using SPC and good heel contact and toe off pattern.  Right knee valgus observed due to OA.  Patient instructed to wean to [facility] for short distances but to continue with walker for longer durations.  Will continue to monitor right knee involvement as we progress.  Patient will benefit from continued PT to build functional range of motion and strength in the left knee.  Eval:  Patient is a 63 y.o. female 2 weeks s/p L TKA who was seen today for physical therapy evaluation and treatment for decreased range of motion, strength, ability to ambulate, and general functional mobility.  She who presents with expected limitations in all above deficits.  She is using the walker at this time for primary mobility outside the house.  She has expected swelling in her knee.  She would benefit from skilled therapy to return to active lifestyle.    OBJECTIVE IMPAIRMENTS: Abnormal gait, decreased activity tolerance, decreased mobility, difficulty walking, decreased ROM, decreased strength, increased edema, increased muscle spasms, and pain.    ACTIVITY LIMITATIONS: carrying, lifting, bending, standing, squatting, stairs, transfers, bed mobility, bathing, and locomotion level   PARTICIPATION LIMITATIONS: meal prep, cleaning, laundry, driving, shopping, community activity, occupation, and yard work   PERSONAL FACTORS: 1-2 comorbidities: hip OA  are also affecting patient's functional outcome.    REHAB POTENTIAL: Good   CLINICAL DECISION MAKING: Stable/uncomplicated   EVALUATION COMPLEXITY: Low     GOALS: Goals reviewed with patient? Yes  04/13/2024     SHORT TERM GOALS: Target date:  Patient will increase passive knee flexion to 120 degrees on the left Baseline: Goal status: progressing 7/18    2.  Patient will increase passive left knee extension to full Baseline:  Goal status: INITIAL   3.  Patient will progress off assistive device  and ambulate greater than 500 feet without significant antalgic gait Baseline:  Goal status: INITIAL   4.  Patient will be independent with HEP Baseline:  Goal status: INITIAL     LONG TERM GOALS: Target date: 05/11/2024     Patient will go up and down 12 steps with reciprocal gait pattern without increased pain Baseline:  Goal status: INITIAL   2.  Patient will squat pick items up off the ground without increased pain Baseline:  Goal status: INITIAL   3.  Patient will ambulate community distances without pain Baseline:  Goal status: INITIAL   4.  Patient will have complete HEP to promote strengthening Baseline:  Goal status: INITIAL     PLAN:   PT FREQUENCY: 2x/week   PT DURATION: 8 weeks   PLANNED INTERVENTIONS: 97110-Therapeutic exercises, 97530- Therapeutic activity, [id number]- Neuromuscular re-education, 97535- Self Care, [id number]- Manual therapy, [id number]- Gait training, [phone]- Aquatic Therapy, [id number]- , [id number]- Ultrasound, Patient/Family education, Stair training, Dry Needling, DME instructions, Cryotherapy, and Moist heat    PLAN FOR NEXT SESSION:   Begin with passive range of motion and edema management.  Progress home exercise program table strengthening.  Progressed to functional strengthening such as squats and steps as tolerated.  Start NuStep and exercise bike next visit.  Asberry Rodes, PTA  04/02/24  5:29 PM    Referring diagnosis? Left TKA  Treatment diagnosis? (if different than referring diagnosis) LEft knee stiffness  Left knee pain  What was this (referring dx) caused by? [x]  Surgery []  Fall []  Ongoing issue []  Arthritis []  Other: ____________  Laterality: []  Rt [x]  Lt []  Both  Check all possible CPT codes:  *CHOOSE 10 OR LESS*    See Planned Interventions listed in the Plan section of the Evaluation.

## 2024-04-05 ENCOUNTER — Ambulatory Visit (HOSPITAL_BASED_OUTPATIENT_CLINIC_OR_DEPARTMENT_OTHER): Admitting: Physical Therapy

## 2024-04-05 ENCOUNTER — Encounter (HOSPITAL_BASED_OUTPATIENT_CLINIC_OR_DEPARTMENT_OTHER): Payer: Self-pay | Admitting: Physical Therapy

## 2024-04-05 DIAGNOSIS — R2689 Other abnormalities of gait and mobility: Secondary | ICD-10-CM

## 2024-04-05 DIAGNOSIS — M25562 Pain in left knee: Secondary | ICD-10-CM

## 2024-04-05 NOTE — Therapy (Signed)
 [facility]    OUTPATIENT PHYSICAL THERAPY LOWER EXTREMITY TREATMENT     Patient Name: Latasha [name] MRN: [id number] DOB:Jan 28, 1961, 63 y.o., female Today's Date: 03/14/2024   END OF SESSION:    PT End of Session - 04/05/24 1605     Visit Number 7    Number of Visits 16    Date for PT Re-Evaluation 05/11/24    Authorization Type [employer] m[employer]    Authorization Time Period 20 visits authorized from 7.14-9.13.25    Authorization - Number of Visits 20    PT Start Time 1530    PT Stop Time 1622    PT Time Calculation (min) 52 min    Activity Tolerance Patient tolerated treatment well    Behavior During Therapy WFL for tasks assessed/performed                     Past Medical History:  Diagnosis Date   Anemia     Anxiety     Arthritis     Asthma     Chronic fatigue     Endometriosis     Family history of osteoporosis     GERD (gastroesophageal reflux disease)     Heart murmur     History of kidney stones     Hypertension     IBS (irritable bowel syndrome)     Obesity     Surgical menopause     Vaginal dryness               Past Surgical History:  Procedure Laterality Date   ABDOMINAL HYSTERECTOMY        tah/bso   KNEE ARTHROPLASTY Left 03/01/2024    Procedure: ARTHROPLASTY, KNEE, TOTAL, USING IMAGELESS COMPUTER-ASSISTED NAVIGATION;  Surgeon: Mardee Lynwood SQUIBB, MD;  Location: [facility];  Service: Orthopedics;  Laterality: Left;   TONSILLECTOMY   1976   TOTAL HIP ARTHROPLASTY Right 10/03/2021    Procedure: TOTAL HIP ARTHROPLASTY;  Surgeon: Mardee Lynwood SQUIBB, MD;  Location: [facility];  Service: Orthopedics;  Laterality: Right;            Patient Active Problem List    Diagnosis Date Noted   History of total knee arthroplasty, left 03/01/2024   Tubular adenoma 02/25/2024   Severe obesity (BMI 35.0-39.9) with comorbidity (HCC) 10/12/2023   Pulmonary nodule 08/21/2023   Hx of total hip arthroplasty, right 10/03/2021   De Quervain's tenosynovitis,  right 09/06/2020   B12 deficiency 07/13/2020   Family history of colon cancer 12/01/2017   Benign essential hypertension 03/01/2016   Herpes zoster without complication 03/01/2016   BP (high blood pressure) 09/05/2015   Osteoporosis, post-menopausal 09/05/2015   Postsurgical menopause 09/05/2015   Class 1 obesity 09/05/2015   GERD (gastroesophageal reflux disease) 09/05/2015   Endometriosis 09/05/2015   Status post total hysterectomy and bilateral salpingo-oophorectomy 09/05/2015   Primary osteoarthritis of both knees 09/28/2014      PCP: Oneil Pinal MD    REFERRING PROVIDER: Fonda Koyanagi  PA-C  Hooten, Lynwood Idalia Raddle., MD (surgeon)   REFERRING DIAG: Left TKA    THERAPY DIAG:  No diagnosis found.   Rationale for Evaluation and Treatment: Rehabilitation   ONSET DATE: DOS  03/01/2024   SUBJECTIVE:    SUBJECTIVE STATEMENT: Knee is feeling pretty good. Using cane a little more but I am fatigued today after going to sleep late.   Eval: Pt received PT from 04-02/2024 for bilat knee OA.  She received aquatic therapy  Pt underwent L TKA on 03/01/2024.  At this plan her pain is well-controlled.  She has been at home health and done well.  She did not use the cane some at home.  She practiced using the cane at home with home health.  She is using a walker outside the house.  She has an exercise program that she has been working.  Will review the exercise program today.  Her chief complaint at this time is difficulty sleeping.  She was advised that this is normal at this point.      PERTINENT HISTORY: L TKA on 6/302025 OA R knee and L shoulder. Bilat shoulder pain R THA in 2023 HTN Osteoporosis Anxiety Anemia Chronic fatigue PAIN:  Are you having pain? Yes: NPRS scale: 3-4 at worst  Pain location: left knee  Pain description: aching  Aggravating factors: standing and walking, sitting for too long, night time  Relieving factors: medication, Ice , rest    PRECAUTIONS:  None   RED FLAGS: None           WEIGHT BEARING RESTRICTIONS: No   FALLS:  Has patient fallen in last 6 months? No   LIVING ENVIRONMENT: 5 steps into the house   OCCUPATION:  Retired    PLOF: Independent   PATIENT GOALS:  To be able to her mother son dance by October at her sons wedding/ to be able to walk      OBJECTIVE:  Note: Objective measures were completed at Evaluation unless otherwise noted.   DIAGNOSTIC FINDINGS:  Nothing post op    PATIENT SURVEYS:  LEFS  Extreme difficulty/unable (0), Quite a bit of difficulty (1), Moderate difficulty (2), Little difficulty (3), No difficulty (4) Survey date:    Any of your usual work, housework or school activities   2. Usual hobbies, recreational or sporting activities   3. Getting into/out of the bath   4. Walking between rooms   5. Putting on socks/shoes   6. Squatting    7. Lifting an object, like a bag of groceries from the floor   8. Performing light activities around your home   9. Performing heavy activities around your home   10. Getting into/out of a car   11. Walking 2 blocks   12. Walking 1 mile   13. Going up/down 10 stairs (1 flight)   14. Standing for 1 hour   15.  sitting for 1 hour   16. Running on even ground   17. Running on uneven ground   18. Making sharp turns while running fast   19. Hopping    20. Rolling over in bed   Score total:  23/80      COGNITION: Overall cognitive status: Within functional limits for tasks assessed                               SENSATION:   EDEMA:  Circumferential: 44.5 R  48.7 L    MUSCLE LENGTH:   POSTURE: rounded shoulders and forward head   PALPATION: No unexpected TTP    LOWER EXTREMITY ROM:   Passive ROM Right eval Left eval left left  Hip flexion        Hip extension        Hip abduction        Hip adduction        Hip internal rotation        Hip external rotation  Knee flexion   96  100 103  Knee extension    -3 -3   Ankle  dorsiflexion        Ankle plantarflexion        Ankle inversion        Ankle eversion         (Blank rows = not tested)   LOWER EXTREMITY MMT:   MMT Right eval Left eval  Hip flexion      Hip extension      Hip abduction      Hip adduction      Hip internal rotation      Hip external rotation      Knee flexion      Knee extension      Ankle dorsiflexion      Ankle plantarflexion      Ankle inversion      Ankle eversion       (Blank rows = not tested) Not tested 2nd to recent surgery       GAIT: Decreased left hip flexion Forward trunk flexion  Decreased left single leg                                                                                                                                  TREATMENT DATE:   8/4 Bike- able to do full revolutions Step ups & downs, forward/lateral, 4 step- cues for core/pelvis control  Discussed transfer of activity to stairs at home Sidelying roller to Lt hip Clams on Lt side Supine ab set, +marching with cues to breathe  Reviewed HEP printout & discussed time for completion  8/1 PROM L knee  Measured 115 passive flexion Roller to quad  SAQ 2.5# 3 2x10 LAQ 3 2x10 STS from plinth x10 Gait with SPC x232ft Nu-step L3   07/28 Manual:Manual:  Edema massage Extension stretching with distraction    There-ex:  SLR 3x10 RPE of 3 not increased 2nd to sciatica  SAQ 3x10  Quad set 3x15   Neuro-re-ed:  Weight shift on scale with cuing to stay equal  3x10   Gait:  Ambulation with single poit cane 200'  Good technique noted More valgus on the right side.  Good stability with no loss of balance  SBA with gait belt    PATIENT EDUCATION:  Education details: Reviewed HEP, symptom management, edema control.  Person educated: Patient Education method: Explanation, Demonstration, Tactile cues, Verbal cues, and Handouts Education comprehension: verbalized understanding, returned demonstration, verbal cues required,  tactile cues required, and needs further education   HOME EXERCISE PROGRAM: Access Code: [email or web address] URL: [email or web address] Date: 03/22/2024 Prepared by: Harlene Cordon  Exercises - Seated Hamstring Stretch  - 2-3 x daily - 7 x weekly - 2 sets - 5 breaths hold - Seated Heel Slide  - 5 x daily - 7 x weekly - 1 sets - 10 reps - Standing Gluteal Sets  - 5 x  daily - 7 x weekly - 1 sets - 10 reps - Standing Weight Shift Side to Side Eyes Closed  - 5 x daily - 7 x weekly - 1 sets - 10 reps - Mini Squat  - 5 x daily - 7 x weekly - 1 sets - 10 reps - Sidelying Hip Abduction  - 3 x daily - 7 x weekly - 3 sets - 10 reps   ASSESSMENT:   CLINICAL IMPRESSION: Focused on activation of proximal structures and discussed how changes to knee will affect lumbopelvic region and can cause discomfort at SIJ.  Eval:  Patient is a 63 y.o. female 2 weeks s/p L TKA who was seen today for physical therapy evaluation and treatment for decreased range of motion, strength, ability to ambulate, and general functional mobility.  She who presents with expected limitations in all above deficits.  She is using the walker at this time for primary mobility outside the house.  She has expected swelling in her knee.  She would benefit from skilled therapy to return to active lifestyle.    OBJECTIVE IMPAIRMENTS: Abnormal gait, decreased activity tolerance, decreased mobility, difficulty walking, decreased ROM, decreased strength, increased edema, increased muscle spasms, and pain.    ACTIVITY LIMITATIONS: carrying, lifting, bending, standing, squatting, stairs, transfers, bed mobility, bathing, and locomotion level   PARTICIPATION LIMITATIONS: meal prep, cleaning, laundry, driving, shopping, community activity, occupation, and yard work   PERSONAL FACTORS: 1-2 comorbidities: hip OA  are also affecting patient's functional outcome.    REHAB POTENTIAL: Good   CLINICAL DECISION MAKING:  Stable/uncomplicated   EVALUATION COMPLEXITY: Low     GOALS: Goals reviewed with patient? Yes  04/13/2024     SHORT TERM GOALS: Target date:  Patient will increase passive knee flexion to 120 degrees on the left Baseline: Goal status: progressing 7/18    2.  Patient will increase passive left knee extension to full Baseline:  Goal status: INITIAL   3.  Patient will progress off assistive device and ambulate greater than 500 feet without significant antalgic gait Baseline:  Goal status: INITIAL   4.  Patient will be independent with HEP Baseline:  Goal status: INITIAL     LONG TERM GOALS: Target date: 05/11/2024     Patient will go up and down 12 steps with reciprocal gait pattern without increased pain Baseline:  Goal status: INITIAL   2.  Patient will squat pick items up off the ground without increased pain Baseline:  Goal status: INITIAL   3.  Patient will ambulate community distances without pain Baseline:  Goal status: INITIAL   4.  Patient will have complete HEP to promote strengthening Baseline:  Goal status: INITIAL     PLAN:   PT FREQUENCY: 2x/week   PT DURATION: 8 weeks   PLANNED INTERVENTIONS: 97110-Therapeutic exercises, 97530- Therapeutic activity, [id number]- Neuromuscular re-education, 97535- Self Care, [id number]- Manual therapy, [id number]- Gait training, [phone]- Aquatic Therapy, [id number]- , [id number]- Ultrasound, Patient/Family education, Stair training, Dry Needling, DME instructions, Cryotherapy, and Moist heat    PLAN FOR NEXT SESSION:   Begin with passive range of motion and edema management.  Progress home exercise program table strengthening.  Progressed to functional strengthening such as squats and steps as tolerated.   Haylyn Halberg C. Wyman Meschke PT, DPT 04/06/24 7:30 AM     Referring diagnosis? Left TKA  Treatment diagnosis? (if different than referring diagnosis) LEft knee stiffness  Left knee pain  What was this (referring dx) caused by? [x]   Surgery []   Fall []  Ongoing issue []  Arthritis []  Other: ____________  Laterality: []  Rt [x]  Lt []  Both  Check all possible CPT codes:  *CHOOSE 10 OR LESS*    See Planned Interventions listed in the Plan section of the Evaluation.

## 2024-04-08 ENCOUNTER — Encounter (HOSPITAL_BASED_OUTPATIENT_CLINIC_OR_DEPARTMENT_OTHER): Payer: Self-pay | Admitting: Physical Therapy

## 2024-04-08 ENCOUNTER — Ambulatory Visit (HOSPITAL_BASED_OUTPATIENT_CLINIC_OR_DEPARTMENT_OTHER): Admitting: Physical Therapy

## 2024-04-08 DIAGNOSIS — R2689 Other abnormalities of gait and mobility: Secondary | ICD-10-CM

## 2024-04-08 DIAGNOSIS — M6281 Muscle weakness (generalized): Secondary | ICD-10-CM

## 2024-04-08 DIAGNOSIS — M25562 Pain in left knee: Secondary | ICD-10-CM

## 2024-04-08 NOTE — Therapy (Signed)
 [facility]    OUTPATIENT PHYSICAL THERAPY LOWER EXTREMITY TREATMENT     Patient Name: Latasha [name] MRN: [id number] DOB:[birth year], 63 y.o., female Today's Date: 03/14/2024   END OF SESSION:    PT End of Session - 04/08/24 1303     Visit Number 8    Number of Visits 16    Date for PT Re-Evaluation 05/11/24    Authorization Type [employer] m[employer]    Authorization Time Period 20 visits authorized from 7.14-9.13.25    Authorization - Visit Number 6    Authorization - Number of Visits 20    PT Start Time 1302    PT Stop Time 1348    PT Time Calculation (min) 46 min    Activity Tolerance Patient tolerated treatment well    Behavior During Therapy WFL for tasks assessed/performed                     Past Medical History:  Diagnosis Date   Anemia     Anxiety     Arthritis     Asthma     Chronic fatigue     Endometriosis     Family history of osteoporosis     GERD (gastroesophageal reflux disease)     Heart murmur     History of kidney stones     Hypertension     IBS (irritable bowel syndrome)     Obesity     Surgical menopause     Vaginal dryness               Past Surgical History:  Procedure Laterality Date   ABDOMINAL HYSTERECTOMY        tah/bso   KNEE ARTHROPLASTY Left 03/01/2024    Procedure: ARTHROPLASTY, KNEE, TOTAL, USING IMAGELESS COMPUTER-ASSISTED NAVIGATION;  Surgeon: Mardee Lynwood SQUIBB, MD;  Location: [facility];  Service: Orthopedics;  Laterality: Left;   TONSILLECTOMY   1976   TOTAL HIP ARTHROPLASTY Right 10/03/2021    Procedure: TOTAL HIP ARTHROPLASTY;  Surgeon: Mardee Lynwood SQUIBB, MD;  Location: [facility];  Service: Orthopedics;  Laterality: Right;            Patient Active Problem List    Diagnosis Date Noted   History of total knee arthroplasty, left 03/01/2024   Tubular adenoma 02/25/2024   Severe obesity (BMI 35.0-39.9) with comorbidity (HCC) 10/12/2023   Pulmonary nodule 08/21/2023   Hx of total hip arthroplasty, right  10/03/2021   De Quervain's tenosynovitis, right 09/06/2020   B12 deficiency 07/13/2020   Family history of colon cancer 12/01/2017   Benign essential hypertension 03/01/2016   Herpes zoster without complication 03/01/2016   BP (high blood pressure) 09/05/2015   Osteoporosis, post-menopausal 09/05/2015   Postsurgical menopause 09/05/2015   Class 1 obesity 09/05/2015   GERD (gastroesophageal reflux disease) 09/05/2015   Endometriosis 09/05/2015   Status post total hysterectomy and bilateral salpingo-oophorectomy 09/05/2015   Primary osteoarthritis of both knees 09/28/2014      PCP: Oneil Pinal MD    REFERRING PROVIDER: Fonda Koyanagi  PA-C  Hooten, Lynwood Idalia Raddle., MD (surgeon)   REFERRING DIAG: Left TKA    THERAPY DIAG:  No diagnosis found.   Rationale for Evaluation and Treatment: Rehabilitation   ONSET DATE: DOS  03/01/2024   SUBJECTIVE:    SUBJECTIVE STATEMENT: I have not had any sciatic nerve pain since last visit. I walked around yesterday and today with just the cane.   Eval: Pt received PT from 04-02/2024 for  bilat knee OA.  She received aquatic therapy  Pt underwent L TKA on 03/01/2024.  At this plan her pain is well-controlled.  She has been at home health and done well.  She did not use the cane some at home.  She practiced using the cane at home with home health.  She is using a walker outside the house.  She has an exercise program that she has been working.  Will review the exercise program today.  Her chief complaint at this time is difficulty sleeping.  She was advised that this is normal at this point.      PERTINENT HISTORY: L TKA on 6/302025 OA R knee and L shoulder. Bilat shoulder pain R THA in 2023 HTN Osteoporosis Anxiety Anemia Chronic fatigue PAIN:  Are you having pain? Yes: NPRS scale: 2 at worst  Pain location: left knee  Pain description: stiff  Aggravating factors: standing and walking, sitting for too long, night time  Relieving  factors: medication, Ice , rest    PRECAUTIONS: None   RED FLAGS: None           WEIGHT BEARING RESTRICTIONS: No   FALLS:  Has patient fallen in last 6 months? No   LIVING ENVIRONMENT: 5 steps into the house   OCCUPATION:  Retired    PLOF: Independent   PATIENT GOALS:  To be able to her mother son dance by October at her sons wedding/ to be able to walk      OBJECTIVE:  Note: Objective measures were completed at Evaluation unless otherwise noted.   DIAGNOSTIC FINDINGS:  Nothing post op    PATIENT SURVEYS:  LEFS  Extreme difficulty/unable (0), Quite a bit of difficulty (1), Moderate difficulty (2), Little difficulty (3), No difficulty (4) Survey date:    Any of your usual work, housework or school activities   2. Usual hobbies, recreational or sporting activities   3. Getting into/out of the bath   4. Walking between rooms   5. Putting on socks/shoes   6. Squatting    7. Lifting an object, like a bag of groceries from the floor   8. Performing light activities around your home   9. Performing heavy activities around your home   10. Getting into/out of a car   11. Walking 2 blocks   12. Walking 1 mile   13. Going up/down 10 stairs (1 flight)   14. Standing for 1 hour   15.  sitting for 1 hour   16. Running on even ground   17. Running on uneven ground   18. Making sharp turns while running fast   19. Hopping    20. Rolling over in bed   Score total:  23/80      COGNITION: Overall cognitive status: Within functional limits for tasks assessed                               SENSATION:   EDEMA:  Circumferential: 44.5 R  48.7 L    MUSCLE LENGTH:   POSTURE: rounded shoulders and forward head   PALPATION: No unexpected TTP    LOWER EXTREMITY ROM:   Passive ROM Right eval Left eval left left  Hip flexion        Hip extension        Hip abduction        Hip adduction        Hip internal rotation  Hip external rotation        Knee flexion    96  100 103  Knee extension    -3 -3   Ankle dorsiflexion        Ankle plantarflexion        Ankle inversion        Ankle eversion         (Blank rows = not tested)   LOWER EXTREMITY MMT:   MMT Right eval Left eval  Hip flexion      Hip extension      Hip abduction      Hip adduction      Hip internal rotation      Hip external rotation      Knee flexion      Knee extension      Ankle dorsiflexion      Ankle plantarflexion      Ankle inversion      Ankle eversion       (Blank rows = not tested) Not tested 2nd to recent surgery       GAIT: Decreased left hip flexion Forward trunk flexion  Decreased left single leg                                                                                                                                  TREATMENT DATE:   8/7 Bike 5:30 no resistance Seated hamstring stretch Supine knee flexion- towel held behind knee, achieved 120 deg today Roller LT hip Sidelying: clam, hip abd with knee ext at 45 deg hip flexion, hip flexion/ext with knee exteded Ab set+marching stairs  8/4 Bike- able to do full revolutions Step ups & downs, forward/lateral, 4 step- cues for core/pelvis control  Discussed transfer of activity to stairs at home Sidelying roller to Lt hip Clams on Lt side Supine ab set, +marching with cues to breathe  Reviewed HEP printout & discussed time for completion  8/1 PROM L knee  Measured 115 passive flexion Roller to quad  SAQ 2.5# 3 2x10 LAQ 3 2x10 STS from plinth x10 Gait with SPC x235ft Nu-step L3   07/28 Manual:Manual:  Edema massage Extension stretching with distraction    There-ex:  SLR 3x10 RPE of 3 not increased 2nd to sciatica  SAQ 3x10  Quad set 3x15   Neuro-re-ed:  Weight shift on scale with cuing to stay equal  3x10   Gait:  Ambulation with single poit cane 200'  Good technique noted More valgus on the right side.  Good stability with no loss of balance  SBA with gait  belt    PATIENT EDUCATION:  Education details: Reviewed HEP, symptom management, edema control.  Person educated: Patient Education method: Explanation, Demonstration, Tactile cues, Verbal cues, and Handouts Education comprehension: verbalized understanding, returned demonstration, verbal cues required, tactile cues required, and needs further education   HOME EXERCISE PROGRAM: Access Code: [email or web address] URL: [email or web address] Date: 03/22/2024  Prepared by: Harlene Cordon  Exercises - Seated Hamstring Stretch  - 2-3 x daily - 7 x weekly - 2 sets - 5 breaths hold - Seated Heel Slide  - 5 x daily - 7 x weekly - 1 sets - 10 reps - Standing Gluteal Sets  - 5 x daily - 7 x weekly - 1 sets - 10 reps - Standing Weight Shift Side to Side Eyes Closed  - 5 x daily - 7 x weekly - 1 sets - 10 reps - Mini Squat  - 5 x daily - 7 x weekly - 1 sets - 10 reps - Sidelying Hip Abduction  - 3 x daily - 7 x weekly - 3 sets - 10 reps   ASSESSMENT:   CLINICAL IMPRESSION: Improved stairs navigation- use of SPC in Rt hand in descending for improved confidence. Decrease sciatic-type pain with manual therapy and will benefit from further strengthening for proximal stability.   Eval:  Patient is a 63 y.o. female 2 weeks s/p L TKA who was seen today for physical therapy evaluation and treatment for decreased range of motion, strength, ability to ambulate, and general functional mobility.  She who presents with expected limitations in all above deficits.  She is using the walker at this time for primary mobility outside the house.  She has expected swelling in her knee.  She would benefit from skilled therapy to return to active lifestyle.    OBJECTIVE IMPAIRMENTS: Abnormal gait, decreased activity tolerance, decreased mobility, difficulty walking, decreased ROM, decreased strength, increased edema, increased muscle spasms, and pain.    ACTIVITY LIMITATIONS: carrying, lifting, bending, standing,  squatting, stairs, transfers, bed mobility, bathing, and locomotion level   PARTICIPATION LIMITATIONS: meal prep, cleaning, laundry, driving, shopping, community activity, occupation, and yard work   PERSONAL FACTORS: 1-2 comorbidities: hip OA  are also affecting patient's functional outcome.    REHAB POTENTIAL: Good   CLINICAL DECISION MAKING: Stable/uncomplicated   EVALUATION COMPLEXITY: Low     GOALS: Goals reviewed with patient? Yes  04/13/2024     SHORT TERM GOALS: Target date:  Patient will increase passive knee flexion to 120 degrees on the left Baseline: Goal status: progressing 7/18    2.  Patient will increase passive left knee extension to full Baseline:  Goal status: INITIAL   3.  Patient will progress off assistive device and ambulate greater than 500 feet without significant antalgic gait Baseline:  Goal status: INITIAL   4.  Patient will be independent with HEP Baseline:  Goal status: INITIAL     LONG TERM GOALS: Target date: 05/11/2024     Patient will go up and down 12 steps with reciprocal gait pattern without increased pain Baseline:  Goal status: INITIAL   2.  Patient will squat pick items up off the ground without increased pain Baseline:  Goal status: INITIAL   3.  Patient will ambulate community distances without pain Baseline:  Goal status: INITIAL   4.  Patient will have complete HEP to promote strengthening Baseline:  Goal status: INITIAL     PLAN:   PT FREQUENCY: 2x/week   PT DURATION: 8 weeks   PLANNED INTERVENTIONS: 97110-Therapeutic exercises, 97530- Therapeutic activity, [id number]- Neuromuscular re-education, 97535- Self Care, 02859- Manual therapy, [id number]- Gait training, [phone]- Aquatic Therapy, [id number]- , [id number]- Ultrasound, Patient/Family education, Stair training, Dry Needling, DME instructions, Cryotherapy, and Moist heat    PLAN FOR NEXT SESSION:   Begin with passive range of motion and edema management.  Progress home  exercise program table strengthening.  Progressed to functional strengthening such as squats and steps as tolerated.   Kimmberly Wisser C. Reid Nawrot PT, DPT 04/08/24 1:51 PM     Referring diagnosis? Left TKA  Treatment diagnosis? (if different than referring diagnosis) LEft knee stiffness  Left knee pain  What was this (referring dx) caused by? [x]  Surgery []  Fall []  Ongoing issue []  Arthritis []  Other: ____________  Laterality: []  Rt [x]  Lt []  Both  Check all possible CPT codes:  *CHOOSE 10 OR LESS*    See Planned Interventions listed in the Plan section of the Evaluation.

## 2024-04-12 ENCOUNTER — Ambulatory Visit (HOSPITAL_BASED_OUTPATIENT_CLINIC_OR_DEPARTMENT_OTHER): Admitting: Physical Therapy

## 2024-04-12 DIAGNOSIS — R2689 Other abnormalities of gait and mobility: Secondary | ICD-10-CM | POA: Diagnosis not present

## 2024-04-12 DIAGNOSIS — M6281 Muscle weakness (generalized): Secondary | ICD-10-CM

## 2024-04-12 DIAGNOSIS — M25562 Pain in left knee: Secondary | ICD-10-CM

## 2024-04-12 NOTE — Therapy (Addendum)
 [facility]    OUTPATIENT PHYSICAL THERAPY LOWER EXTREMITY TREATMENT     Patient Name: Uilani Sanville MRN: [id number] DOB:12-Jun-1961, 63 y.o., female Today's Date: 03/14/2024   END OF SESSION:    PT End of Session - 04/13/24 1123     Visit Number 9    Number of Visits 16    Date for PT Re-Evaluation 05/11/24    PT Start Time 1315    PT Stop Time 1358    PT Time Calculation (min) 43 min    Activity Tolerance Patient tolerated treatment well    Behavior During Therapy [facility] for tasks assessed/performed           Progress Note Reporting Period 03/15/2024 to 04/12/2024  See note below for Objective Data and Assessment of Progress/Goals.               Past Medical History:  Diagnosis Date   Anemia     Anxiety     Arthritis     Asthma     Chronic fatigue     Endometriosis     Family history of osteoporosis     GERD (gastroesophageal reflux disease)     Heart murmur     History of kidney stones     Hypertension     IBS (irritable bowel syndrome)     Obesity     Surgical menopause     Vaginal dryness               Past Surgical History:  Procedure Laterality Date   ABDOMINAL HYSTERECTOMY        tah/bso   KNEE ARTHROPLASTY Left 03/01/2024    Procedure: ARTHROPLASTY, KNEE, TOTAL, USING IMAGELESS COMPUTER-ASSISTED NAVIGATION;  Surgeon: Mardee Lynwood SQUIBB, MD;  Location: [facility];  Service: Orthopedics;  Laterality: Left;   TONSILLECTOMY   1976   TOTAL HIP ARTHROPLASTY Right 10/03/2021    Procedure: TOTAL HIP ARTHROPLASTY;  Surgeon: Mardee Lynwood SQUIBB, MD;  Location: [facility];  Service: Orthopedics;  Laterality: Right;            Patient Active Problem List    Diagnosis Date Noted   History of total knee arthroplasty, left 03/01/2024   Tubular adenoma 02/25/2024   Severe obesity (BMI 35.0-39.9) with comorbidity (HCC) 10/12/2023   Pulmonary nodule 08/21/2023   Hx of total hip arthroplasty, right 10/03/2021   De Quervain's tenosynovitis, right  09/06/2020   B12 deficiency 07/13/2020   Family history of colon cancer 12/01/2017   Benign essential hypertension 03/01/2016   Herpes zoster without complication 03/01/2016   BP (high blood pressure) 09/05/2015   Osteoporosis, post-menopausal 09/05/2015   Postsurgical menopause 09/05/2015   Class 1 obesity 09/05/2015   GERD (gastroesophageal reflux disease) 09/05/2015   Endometriosis 09/05/2015   Status post total hysterectomy and bilateral salpingo-oophorectomy 09/05/2015   Primary osteoarthritis of both knees 09/28/2014      PCP: Oneil Pinal MD    REFERRING PROVIDER: Fonda Koyanagi  PA-C  Hooten, Lynwood Idalia Raddle., MD (surgeon)   REFERRING DIAG: Left TKA    THERAPY DIAG:  No diagnosis found.   Rationale for Evaluation and Treatment: Rehabilitation   ONSET DATE: DOS  03/01/2024   SUBJECTIVE:    SUBJECTIVE STATEMENT: The sciatic nerve pain has improved. Overall she is still having rouble sleeping   Eval: Pt received PT from 04-02/2024 for bilat knee OA.  She received aquatic therapy  Pt underwent L TKA on 03/01/2024.  At this plan her pain  is well-controlled.  She has been at home health and done well.  She did not use the cane some at home.  She practiced using the cane at home with home health.  She is using a walker outside the house.  She has an exercise program that she has been working.  Will review the exercise program today.  Her chief complaint at this time is difficulty sleeping.  She was advised that this is normal at this point.      PERTINENT HISTORY: L TKA on 6/302025 OA R knee and L shoulder. Bilat shoulder pain R THA in 2023 HTN Osteoporosis Anxiety Anemia Chronic fatigue PAIN:  Are you having pain? Yes: NPRS scale: 2 at worst  Pain location: left knee  Pain description: stiff  Aggravating factors: standing and walking, sitting for too long, night time  Relieving factors: medication, Ice , rest    PRECAUTIONS: None   RED FLAGS: None            WEIGHT BEARING RESTRICTIONS: No   FALLS:  Has patient fallen in last 6 months? No   LIVING ENVIRONMENT: 5 steps into the house   OCCUPATION:  Retired    PLOF: Independent   PATIENT GOALS:  To be able to her mother son dance by October at her sons wedding/ to be able to walk      OBJECTIVE:  Note: Objective measures were completed at Evaluation unless otherwise noted.   DIAGNOSTIC FINDINGS:  Nothing post op    PATIENT SURVEYS:  LEFS  Extreme difficulty/unable (0), Quite a bit of difficulty (1), Moderate difficulty (2), Little difficulty (3), No difficulty (4) Survey date:    Any of your usual work, housework or school activities   2. Usual hobbies, recreational or sporting activities   3. Getting into/out of the bath   4. Walking between rooms   5. Putting on socks/shoes   6. Squatting    7. Lifting an object, like a bag of groceries from the floor   8. Performing light activities around your home   9. Performing heavy activities around your home   10. Getting into/out of a car   11. Walking 2 blocks   12. Walking 1 mile   13. Going up/down 10 stairs (1 flight)   14. Standing for 1 hour   15.  sitting for 1 hour   16. Running on even ground   17. Running on uneven ground   18. Making sharp turns while running fast   19. Hopping    20. Rolling over in bed   Score total:  23/80      COGNITION: Overall cognitive status: Within functional limits for tasks assessed                               SENSATION:   EDEMA:  Circumferential: 44.5 R  48.7 L    MUSCLE LENGTH:   POSTURE: rounded shoulders and forward head   PALPATION: No unexpected TTP    LOWER EXTREMITY ROM:   Passive ROM Right eval Left eval left left Left  8/11   Hip flexion         Hip extension         Hip abduction         Hip adduction         Hip internal rotation         Hip external rotation  Knee flexion   96  100 103 118   Knee extension    -3 -3    Ankle dorsiflexion          Ankle plantarflexion         Ankle inversion         Ankle eversion          (Blank rows = not tested)   LOWER EXTREMITY MMT:   MMT Right eval Left eval  Hip flexion      Hip extension      Hip abduction      Hip adduction      Hip internal rotation      Hip external rotation      Knee flexion      Knee extension      Ankle dorsiflexion      Ankle plantarflexion      Ankle inversion      Ankle eversion       (Blank rows = not tested) Not tested 2nd to recent surgery       GAIT: Decreased left hip flexion Forward trunk flexion  Decreased left single leg                                                                                                                                    TREATMENT DATE:   8/11 Manual:  Trigger point release to posterior knee and quad  Review of self trigger point release to the quad.   There-ex:  SLR 3x10  LAQ 3x10 SAQ 3x10   There-act:  Step up 4 inch 2x10  Squat 2x10 in low range    8/7 Bike 5:30 no resistance Seated hamstring stretch Supine knee flexion- towel held behind knee, achieved 120 deg today Roller LT hip Sidelying: clam, hip abd with knee ext at 45 deg hip flexion, hip flexion/ext with knee exteded Ab set+marching stairs  8/4 Bike- able to do full revolutions Step ups & downs, forward/lateral, 4 step- cues for core/pelvis control  Discussed transfer of activity to stairs at home Sidelying roller to Lt hip Clams on Lt side Supine ab set, +marching with cues to breathe  Reviewed HEP printout & discussed time for completion  8/1 PROM L knee  Measured 115 passive flexion Roller to quad  SAQ 2.5# 3 2x10 LAQ 3 2x10 STS from plinth x10 Gait with SPC x23ft Nu-step L3   07/28 Manual:Manual:  Edema massage Extension stretching with distraction    There-ex:  SLR 3x10 RPE of 3 not increased 2nd to sciatica  SAQ 3x10  Quad set 3x15   Neuro-re-ed:  Weight shift on scale with cuing to  stay equal  3x10   Gait:  Ambulation with single poit cane 200'  Good technique noted More valgus on the right side.  Good stability with no loss of balance  SBA with gait belt    PATIENT EDUCATION:  Education  details: Reviewed HEP, symptom management, edema control.  Person educated: Patient Education method: Explanation, Demonstration, Tactile cues, Verbal cues, and Handouts Education comprehension: verbalized understanding, returned demonstration, verbal cues required, tactile cues required, and needs further education   HOME EXERCISE PROGRAM: Access Code: [email or web address] URL: [email or web address] Date: 03/22/2024 Prepared by: Harlene Cordon  Exercises - Seated Hamstring Stretch  - 2-3 x daily - 7 x weekly - 2 sets - 5 breaths hold - Seated Heel Slide  - 5 x daily - 7 x weekly - 1 sets - 10 reps - Standing Gluteal Sets  - 5 x daily - 7 x weekly - 1 sets - 10 reps - Standing Weight Shift Side to Side Eyes Closed  - 5 x daily - 7 x weekly - 1 sets - 10 reps - Mini Squat  - 5 x daily - 7 x weekly - 1 sets - 10 reps - Sidelying Hip Abduction  - 3 x daily - 7 x weekly - 3 sets - 10 reps   ASSESSMENT:   CLINICAL IMPRESSION: The patient continues to have difficulty sleeping. Overall her range is improving significantly. She has also had improved ability to perfrom a SLR. She is walking with the cane. She tolerated squatting well. Her ability to do steps is imprpving as well. Therapy will continue to progress as tolerated. The patients total arc of motion was measured at 0-118. Her overall functional mobility has improved. She has been able to do more. She is having difficulty sleeping. She is still using the cane as well. She would benefit from further skilled therapy to work on stairs and gait training.  Eval:  Patient is a 63 y.o. female 2 weeks s/p L TKA who was seen today for physical therapy evaluation and treatment for decreased range of motion, strength, ability to  ambulate, and general functional mobility.  She who presents with expected limitations in all above deficits.  She is using the walker at this time for primary mobility outside the house.  She has expected swelling in her knee.  She would benefit from skilled therapy to return to active lifestyle.    OBJECTIVE IMPAIRMENTS: Abnormal gait, decreased activity tolerance, decreased mobility, difficulty walking, decreased ROM, decreased strength, increased edema, increased muscle spasms, and pain.    ACTIVITY LIMITATIONS: carrying, lifting, bending, standing, squatting, stairs, transfers, bed mobility, bathing, and locomotion level   PARTICIPATION LIMITATIONS: meal prep, cleaning, laundry, driving, shopping, community activity, occupation, and yard work   PERSONAL FACTORS: 1-2 comorbidities: hip OA  are also affecting patient's functional outcome.    REHAB POTENTIAL: Good   CLINICAL DECISION MAKING: Stable/uncomplicated   EVALUATION COMPLEXITY: Low     GOALS: Goals reviewed with patient? Yes  04/13/2024     SHORT TERM GOALS: Target date:  Patient will increase passive knee flexion to 120 degrees on the left Baseline: Goal status: progressing 7/18    2.  Patient will increase passive left knee extension to full Baseline:  Goal status: archived 8/11   3.  Patient will progress off assistive device and ambulate greater than 500 feet without significant antalgic gait Baseline:  Goal status: INITIAL   4.  Patient will be independent with HEP Baseline:  Goal status: INITIAL     LONG TERM GOALS: Target date: 05/11/2024     Patient will go up and down 12 steps with reciprocal gait pattern without increased pain Baseline:  Goal status: INITIAL   2.  Patient will squat pick items up off the  ground without increased pain Baseline:  Goal status: INITIAL   3.  Patient will ambulate community distances without pain Baseline:  Goal status: INITIAL   4.  Patient will have complete  HEP to promote strengthening Baseline:  Goal status: INITIAL     PLAN:   PT FREQUENCY: 2x/week   PT DURATION: 8 weeks   PLANNED INTERVENTIONS: 97110-Therapeutic exercises, 97530- Therapeutic activity, [id number]- Neuromuscular re-education, 97535- Self Care, [id number]- Manual therapy, [id number]- Gait training, [id number]- Aquatic Therapy, [id number]- , [id number]- Ultrasound, Patient/Family education, Stair training, Dry Needling, DME instructions, Cryotherapy, and Moist heat    PLAN FOR NEXT SESSION:   Begin with passive range of motion and edema management.  Progress home exercise program table strengthening.  Progressed to functional strengthening such as squats and steps as tolerated.   Jessica C. Hightower PT, DPT 04/13/24 11:24 AM     Referring diagnosis? Left TKA  Treatment diagnosis? (if different than referring diagnosis) LEft knee stiffness  Left knee pain  What was this (referring dx) caused by? [x]  Surgery []  Fall []  Ongoing issue []  Arthritis []  Other: ____________  Laterality: []  Rt [x]  Lt []  Both  Check all possible CPT codes:  *CHOOSE 10 OR LESS*    See Planned Interventions listed in the Plan section of the Evaluation.

## 2024-04-12 NOTE — Therapy (Incomplete Revision)
 [facility]    OUTPATIENT PHYSICAL THERAPY LOWER EXTREMITY TREATMENT     Patient Name: Latasha [name] MRN: [id number] DOB:12-Jun-1961, 63 y.o., female Today's Date: 03/14/2024   END OF SESSION:    PT End of Session - 04/13/24 1123     Visit Number 9    Number of Visits 16    Date for PT Re-Evaluation 05/11/24    PT Start Time 1315    PT Stop Time 1358    PT Time Calculation (min) 43 min    Activity Tolerance Patient tolerated treatment well    Behavior During Therapy [facility] for tasks assessed/performed           Progress Note Reporting Period 03/15/2024 to 04/12/2024  See note below for Objective Data and Assessment of Progress/Goals.               Past Medical History:  Diagnosis Date   Anemia     Anxiety     Arthritis     Asthma     Chronic fatigue     Endometriosis     Family history of osteoporosis     GERD (gastroesophageal reflux disease)     Heart murmur     History of kidney stones     Hypertension     IBS (irritable bowel syndrome)     Obesity     Surgical menopause     Vaginal dryness               Past Surgical History:  Procedure Laterality Date   ABDOMINAL HYSTERECTOMY        tah/bso   KNEE ARTHROPLASTY Left 03/01/2024    Procedure: ARTHROPLASTY, KNEE, TOTAL, USING IMAGELESS COMPUTER-ASSISTED NAVIGATION;  Surgeon: Mardee Lynwood SQUIBB, MD;  Location: [facility];  Service: Orthopedics;  Laterality: Left;   TONSILLECTOMY   1976   TOTAL HIP ARTHROPLASTY Right 10/03/2021    Procedure: TOTAL HIP ARTHROPLASTY;  Surgeon: Mardee Lynwood SQUIBB, MD;  Location: [facility];  Service: Orthopedics;  Laterality: Right;            Patient Active Problem List    Diagnosis Date Noted   History of total knee arthroplasty, left 03/01/2024   Tubular adenoma 02/25/2024   Severe obesity (BMI 35.0-39.9) with comorbidity (HCC) 10/12/2023   Pulmonary nodule 08/21/2023   Hx of total hip arthroplasty, right 10/03/2021   De Quervain's tenosynovitis, right  09/06/2020   B12 deficiency 07/13/2020   Family history of colon cancer 12/01/2017   Benign essential hypertension 03/01/2016   Herpes zoster without complication 03/01/2016   BP (high blood pressure) 09/05/2015   Osteoporosis, post-menopausal 09/05/2015   Postsurgical menopause 09/05/2015   Class 1 obesity 09/05/2015   GERD (gastroesophageal reflux disease) 09/05/2015   Endometriosis 09/05/2015   Status post total hysterectomy and bilateral salpingo-oophorectomy 09/05/2015   Primary osteoarthritis of both knees 09/28/2014      PCP: Oneil Pinal MD    REFERRING PROVIDER: Fonda Koyanagi  PA-C  Hooten, Lynwood Idalia Raddle., MD (surgeon)   REFERRING DIAG: Left TKA    THERAPY DIAG:  No diagnosis found.   Rationale for Evaluation and Treatment: Rehabilitation   ONSET DATE: DOS  03/01/2024   SUBJECTIVE:    SUBJECTIVE STATEMENT: The sciatic nerve pain has improved. Overall she is still having rouble sleeping   Eval: Pt received PT from 04-02/2024 for bilat knee OA.  She received aquatic therapy  Pt underwent L TKA on 03/01/2024.  At this plan her pain  is well-controlled.  She has been at home health and done well.  She did not use the cane some at home.  She practiced using the cane at home with home health.  She is using a walker outside the house.  She has an exercise program that she has been working.  Will review the exercise program today.  Her chief complaint at this time is difficulty sleeping.  She was advised that this is normal at this point.      PERTINENT HISTORY: L TKA on 6/302025 OA R knee and L shoulder. Bilat shoulder pain R THA in 2023 HTN Osteoporosis Anxiety Anemia Chronic fatigue PAIN:  Are you having pain? Yes: NPRS scale: 2 at worst  Pain location: left knee  Pain description: stiff  Aggravating factors: standing and walking, sitting for too long, night time  Relieving factors: medication, Ice , rest    PRECAUTIONS: None   RED FLAGS: None            WEIGHT BEARING RESTRICTIONS: No   FALLS:  Has patient fallen in last 6 months? No   LIVING ENVIRONMENT: 5 steps into the house   OCCUPATION:  Retired    PLOF: Independent   PATIENT GOALS:  To be able to her mother son dance by October at her sons wedding/ to be able to walk      OBJECTIVE:  Note: Objective measures were completed at Evaluation unless otherwise noted.   DIAGNOSTIC FINDINGS:  Nothing post op    PATIENT SURVEYS:  LEFS  Extreme difficulty/unable (0), Quite a bit of difficulty (1), Moderate difficulty (2), Little difficulty (3), No difficulty (4) Survey date:    Any of your usual work, housework or school activities   2. Usual hobbies, recreational or sporting activities   3. Getting into/out of the bath   4. Walking between rooms   5. Putting on socks/shoes   6. Squatting    7. Lifting an object, like a bag of groceries from the floor   8. Performing light activities around your home   9. Performing heavy activities around your home   10. Getting into/out of a car   11. Walking 2 blocks   12. Walking 1 mile   13. Going up/down 10 stairs (1 flight)   14. Standing for 1 hour   15.  sitting for 1 hour   16. Running on even ground   17. Running on uneven ground   18. Making sharp turns while running fast   19. Hopping    20. Rolling over in bed   Score total:  23/80      COGNITION: Overall cognitive status: Within functional limits for tasks assessed                               SENSATION:   EDEMA:  Circumferential: 44.5 R  48.7 L    MUSCLE LENGTH:   POSTURE: rounded shoulders and forward head   PALPATION: No unexpected TTP    LOWER EXTREMITY ROM:   Passive ROM Right eval Left eval left left Left  8/11   Hip flexion         Hip extension         Hip abduction         Hip adduction         Hip internal rotation         Hip external rotation  Knee flexion   96  100 103 118   Knee extension    -3 -3    Ankle dorsiflexion          Ankle plantarflexion         Ankle inversion         Ankle eversion          (Blank rows = not tested)   LOWER EXTREMITY MMT:   MMT Right eval Left eval  Hip flexion      Hip extension      Hip abduction      Hip adduction      Hip internal rotation      Hip external rotation      Knee flexion      Knee extension      Ankle dorsiflexion      Ankle plantarflexion      Ankle inversion      Ankle eversion       (Blank rows = not tested) Not tested 2nd to recent surgery       GAIT: Decreased left hip flexion Forward trunk flexion  Decreased left single leg                                                                                                                                    TREATMENT DATE:   8/11 Manual:  Trigger point release to posterior knee and quad  Review of self trigger point release to the quad.   There-ex:  SLR 3x10  LAQ 3x10 SAQ 3x10   There-act:  Step up 4 inch 2x10  Squat 2x10 in low range    8/7 Bike 5:30 no resistance Seated hamstring stretch Supine knee flexion- towel held behind knee, achieved 120 deg today Roller LT hip Sidelying: clam, hip abd with knee ext at 45 deg hip flexion, hip flexion/ext with knee exteded Ab set+marching stairs  8/4 Bike- able to do full revolutions Step ups & downs, forward/lateral, 4 step- cues for core/pelvis control  Discussed transfer of activity to stairs at home Sidelying roller to Lt hip Clams on Lt side Supine ab set, +marching with cues to breathe  Reviewed HEP printout & discussed time for completion  8/1 PROM L knee  Measured 115 passive flexion Roller to quad  SAQ 2.5# 3 2x10 LAQ 3 2x10 STS from plinth x10 Gait with SPC x23ft Nu-step L3   07/28 Manual:Manual:  Edema massage Extension stretching with distraction    There-ex:  SLR 3x10 RPE of 3 not increased 2nd to sciatica  SAQ 3x10  Quad set 3x15   Neuro-re-ed:  Weight shift on scale with cuing to  stay equal  3x10   Gait:  Ambulation with single poit cane 200'  Good technique noted More valgus on the right side.  Good stability with no loss of balance  SBA with gait belt    PATIENT EDUCATION:  Education  details: Reviewed HEP, symptom management, edema control.  Person educated: Patient Education method: Explanation, Demonstration, Tactile cues, Verbal cues, and Handouts Education comprehension: verbalized understanding, returned demonstration, verbal cues required, tactile cues required, and needs further education   HOME EXERCISE PROGRAM: Access Code: [email or web address] URL: [email or web address] Date: 03/22/2024 Prepared by: Harlene Cordon  Exercises - Seated Hamstring Stretch  - 2-3 x daily - 7 x weekly - 2 sets - 5 breaths hold - Seated Heel Slide  - 5 x daily - 7 x weekly - 1 sets - 10 reps - Standing Gluteal Sets  - 5 x daily - 7 x weekly - 1 sets - 10 reps - Standing Weight Shift Side to Side Eyes Closed  - 5 x daily - 7 x weekly - 1 sets - 10 reps - Mini Squat  - 5 x daily - 7 x weekly - 1 sets - 10 reps - Sidelying Hip Abduction  - 3 x daily - 7 x weekly - 3 sets - 10 reps   ASSESSMENT:   CLINICAL IMPRESSION: The patient continues to have difficulty sleeping. Overall her range is improving significantly. She has also had improved ability to perfrom a SLR. She is walking with the cane. She tolerated squatting well. Her ability to do steps is imprpving as well. Therapy will continue to progress as tolerated. The patients total arc of motion was measured at 0-118. Her overall functional mobility has improved. She has been able to do more. She is having difficulty sleeping. She is still using the cane as well. She would benefit from further skilled therapy to work on stairs and gait training.  Eval:  Patient is a 63 y.o. female 2 weeks s/p L TKA who was seen today for physical therapy evaluation and treatment for decreased range of motion, strength, ability to  ambulate, and general functional mobility.  She who presents with expected limitations in all above deficits.  She is using the walker at this time for primary mobility outside the house.  She has expected swelling in her knee.  She would benefit from skilled therapy to return to active lifestyle.    OBJECTIVE IMPAIRMENTS: Abnormal gait, decreased activity tolerance, decreased mobility, difficulty walking, decreased ROM, decreased strength, increased edema, increased muscle spasms, and pain.    ACTIVITY LIMITATIONS: carrying, lifting, bending, standing, squatting, stairs, transfers, bed mobility, bathing, and locomotion level   PARTICIPATION LIMITATIONS: meal prep, cleaning, laundry, driving, shopping, community activity, occupation, and yard work   PERSONAL FACTORS: 1-2 comorbidities: hip OA  are also affecting patient's functional outcome.    REHAB POTENTIAL: Good   CLINICAL DECISION MAKING: Stable/uncomplicated   EVALUATION COMPLEXITY: Low     GOALS: Goals reviewed with patient? Yes  04/13/2024     SHORT TERM GOALS: Target date:  Patient will increase passive knee flexion to 120 degrees on the left Baseline: Goal status: progressing 7/18    2.  Patient will increase passive left knee extension to full Baseline:  Goal status: archived 8/11   3.  Patient will progress off assistive device and ambulate greater than 500 feet without significant antalgic gait Baseline:  Goal status: INITIAL   4.  Patient will be independent with HEP Baseline:  Goal status: INITIAL     LONG TERM GOALS: Target date: 05/11/2024     Patient will go up and down 12 steps with reciprocal gait pattern without increased pain Baseline:  Goal status: INITIAL   2.  Patient will squat pick items up off the  ground without increased pain Baseline:  Goal status: INITIAL   3.  Patient will ambulate community distances without pain Baseline:  Goal status: INITIAL   4.  Patient will have complete  HEP to promote strengthening Baseline:  Goal status: INITIAL     PLAN:   PT FREQUENCY: 2x/week   PT DURATION: 8 weeks   PLANNED INTERVENTIONS: 97110-Therapeutic exercises, 97530- Therapeutic activity, [id number]- Neuromuscular re-education, 97535- Self Care, [id number]- Manual therapy, [id number]- Gait training, [id number]- Aquatic Therapy, [id number]- , [id number]- Ultrasound, Patient/Family education, Stair training, Dry Needling, DME instructions, Cryotherapy, and Moist heat    PLAN FOR NEXT SESSION:   Begin with passive range of motion and edema management.  Progress home exercise program table strengthening.  Progressed to functional strengthening such as squats and steps as tolerated.   Jessica C. Hightower PT, DPT 04/13/24 11:24 AM     Referring diagnosis? Left TKA  Treatment diagnosis? (if different than referring diagnosis) LEft knee stiffness  Left knee pain  What was this (referring dx) caused by? [x]  Surgery []  Fall []  Ongoing issue []  Arthritis []  Other: ____________  Laterality: []  Rt [x]  Lt []  Both  Check all possible CPT codes:  *CHOOSE 10 OR LESS*    See Planned Interventions listed in the Plan section of the Evaluation.

## 2024-04-13 ENCOUNTER — Encounter (HOSPITAL_BASED_OUTPATIENT_CLINIC_OR_DEPARTMENT_OTHER): Payer: Self-pay | Admitting: Physical Therapy

## 2024-04-14 ENCOUNTER — Encounter (HOSPITAL_BASED_OUTPATIENT_CLINIC_OR_DEPARTMENT_OTHER): Payer: Self-pay | Admitting: Physical Therapy

## 2024-04-14 ENCOUNTER — Ambulatory Visit (HOSPITAL_BASED_OUTPATIENT_CLINIC_OR_DEPARTMENT_OTHER): Admitting: Physical Therapy

## 2024-04-14 DIAGNOSIS — R2689 Other abnormalities of gait and mobility: Secondary | ICD-10-CM | POA: Diagnosis not present

## 2024-04-14 DIAGNOSIS — M25562 Pain in left knee: Secondary | ICD-10-CM

## 2024-04-14 DIAGNOSIS — M6281 Muscle weakness (generalized): Secondary | ICD-10-CM

## 2024-04-14 NOTE — Therapy (Signed)
 [facility]    OUTPATIENT PHYSICAL THERAPY LOWER EXTREMITY TREATMENT     Patient Name: Latasha [name] MRN: [id number] DOB:28-Nov-1960, 63 y.o., female Today's Date: 03/14/2024   END OF SESSION:    PT End of Session - 04/14/24 1442     Visit Number 10    Number of Visits 16    Date for PT Re-Evaluation 05/11/24    Authorization Type [employer]                      Past Medical History:  Diagnosis Date   Anemia     Anxiety     Arthritis     Asthma     Chronic fatigue     Endometriosis     Family history of osteoporosis     GERD (gastroesophageal reflux disease)     Heart murmur     History of kidney stones     Hypertension     IBS (irritable bowel syndrome)     Obesity     Surgical menopause     Vaginal dryness               Past Surgical History:  Procedure Laterality Date   ABDOMINAL HYSTERECTOMY        tah/bso   KNEE ARTHROPLASTY Left 03/01/2024    Procedure: ARTHROPLASTY, KNEE, TOTAL, USING IMAGELESS COMPUTER-ASSISTED NAVIGATION;  Surgeon: Mardee Lynwood SQUIBB, MD;  Location: [facility];  Service: Orthopedics;  Laterality: Left;   TONSILLECTOMY   1976   TOTAL HIP ARTHROPLASTY Right 10/03/2021    Procedure: TOTAL HIP ARTHROPLASTY;  Surgeon: Mardee Lynwood SQUIBB, MD;  Location: [facility];  Service: Orthopedics;  Laterality: Right;            Patient Active Problem List    Diagnosis Date Noted   History of total knee arthroplasty, left 03/01/2024   Tubular adenoma 02/25/2024   Severe obesity (BMI 35.0-39.9) with comorbidity (HCC) 10/12/2023   Pulmonary nodule 08/21/2023   Hx of total hip arthroplasty, right 10/03/2021   De Quervain's tenosynovitis, right 09/06/2020   B12 deficiency 07/13/2020   Family history of colon cancer 12/01/2017   Benign essential hypertension 03/01/2016   Herpes zoster without complication 03/01/2016   BP (high blood pressure) 09/05/2015   Osteoporosis, post-menopausal 09/05/2015   Postsurgical menopause 09/05/2015    Class 1 obesity 09/05/2015   GERD (gastroesophageal reflux disease) 09/05/2015   Endometriosis 09/05/2015   Status post total hysterectomy and bilateral salpingo-oophorectomy 09/05/2015   Primary osteoarthritis of both knees 09/28/2014      PCP: Oneil Pinal MD    REFERRING PROVIDER: Fonda Koyanagi  PA-C  Hooten, Lynwood Idalia Raddle., MD (surgeon)   REFERRING DIAG: Left TKA    THERAPY DIAG:  No diagnosis found.   Rationale for Evaluation and Treatment: Rehabilitation   ONSET DATE: DOS  03/01/2024   SUBJECTIVE:    SUBJECTIVE STATEMENT: The sciatic nerve pain has improved. Overall she is still having rouble sleeping   Eval: Pt received PT from 04-02/2024 for bilat knee OA.  She received aquatic therapy  Pt underwent L TKA on 03/01/2024.  At this plan her pain is well-controlled.  She has been at home health and done well.  She did not use the cane some at home.  She practiced using the cane at home with home health.  She is using a walker outside the house.  She has an exercise program that she has been working.  Will review  the exercise program today.  Her chief complaint at this time is difficulty sleeping.  She was advised that this is normal at this point.      PERTINENT HISTORY: L TKA on 6/302025 OA R knee and L shoulder. Bilat shoulder pain R THA in 2023 HTN Osteoporosis Anxiety Anemia Chronic fatigue PAIN:  Are you having pain? Yes: NPRS scale: 2 at worst  Pain location: left knee  Pain description: stiff  Aggravating factors: standing and walking, sitting for too long, night time  Relieving factors: medication, Ice , rest    PRECAUTIONS: None   RED FLAGS: None           WEIGHT BEARING RESTRICTIONS: No   FALLS:  Has patient fallen in last 6 months? No   LIVING ENVIRONMENT: 5 steps into the house   OCCUPATION:  Retired    PLOF: Independent   PATIENT GOALS:  To be able to her mother son dance by October at her sons wedding/ to be able to walk       OBJECTIVE:  Note: Objective measures were completed at Evaluation unless otherwise noted.   DIAGNOSTIC FINDINGS:  Nothing post op    PATIENT SURVEYS:  LEFS  Extreme difficulty/unable (0), Quite a bit of difficulty (1), Moderate difficulty (2), Little difficulty (3), No difficulty (4) Survey date:    Any of your usual work, housework or school activities   2. Usual hobbies, recreational or sporting activities   3. Getting into/out of the bath   4. Walking between rooms   5. Putting on socks/shoes   6. Squatting    7. Lifting an object, like a bag of groceries from the floor   8. Performing light activities around your home   9. Performing heavy activities around your home   10. Getting into/out of a car   11. Walking 2 blocks   12. Walking 1 mile   13. Going up/down 10 stairs (1 flight)   14. Standing for 1 hour   15.  sitting for 1 hour   16. Running on even ground   17. Running on uneven ground   18. Making sharp turns while running fast   19. Hopping    20. Rolling over in bed   Score total:  23/80      COGNITION: Overall cognitive status: Within functional limits for tasks assessed                               SENSATION:   EDEMA:  Circumferential: 44.5 R  48.7 L    MUSCLE LENGTH:   POSTURE: rounded shoulders and forward head   PALPATION: No unexpected TTP    LOWER EXTREMITY ROM:   Passive ROM Right eval Left eval left left Left  8/11   Hip flexion         Hip extension         Hip abduction         Hip adduction         Hip internal rotation         Hip external rotation         Knee flexion   96  100 103 118   Knee extension    -3 -3    Ankle dorsiflexion         Ankle plantarflexion         Ankle inversion  Ankle eversion          (Blank rows = not tested)   LOWER EXTREMITY MMT:   MMT Right eval Left eval  Hip flexion      Hip extension      Hip abduction      Hip adduction      Hip internal rotation      Hip external  rotation      Knee flexion      Knee extension      Ankle dorsiflexion      Ankle plantarflexion      Ankle inversion      Ankle eversion       (Blank rows = not tested) Not tested 2nd to recent surgery       GAIT: Decreased left hip flexion Forward trunk flexion  Decreased left single leg                                                                                                                                    TREATMENT DATE:   8/13 Manual:  Trigger point release to posterior knee and quad  Review of self trigger point release to the quad.  Reviewed use of her muscle roller   There-ex:  SLR 3x10  LAQ 3x10 1lb  SAQ 3x10 1 lb   There-act:  Step up 4 inch 2x10  Squat 2x10 in low range   Neuro-re-ed:   Heel/toe rock 2x12   8/11 Manual:  Trigger point release to posterior knee and quad  Review of self trigger point release to the quad.   There-ex:  SLR 3x10  LAQ 3x10 SAQ 3x10   There-act:  Step up 4 inch 2x10  Squat 2x10 in low range    8/7 Bike 5:30 no resistance Seated hamstring stretch Supine knee flexion- towel held behind knee, achieved 120 deg today Roller LT hip Sidelying: clam, hip abd with knee ext at 45 deg hip flexion, hip flexion/ext with knee exteded Ab set+marching stairs  8/4 Bike- able to do full revolutions Step ups & downs, forward/lateral, 4 step- cues for core/pelvis control  Discussed transfer of activity to stairs at home Sidelying roller to Lt hip Clams on Lt side Supine ab set, +marching with cues to breathe  Reviewed HEP printout & discussed time for completion  8/1 PROM L knee  Measured 115 passive flexion Roller to quad  SAQ 2.5# 3 2x10 LAQ 3 2x10 STS from plinth x10 Gait with SPC x235ft Nu-step L3   07/28 Manual:Manual:  Edema massage Extension stretching with distraction    There-ex:  SLR 3x10 RPE of 3 not increased 2nd to sciatica  SAQ 3x10  Quad set 3x15   Neuro-re-ed:  Weight  shift on scale with cuing to stay equal  3x10   Gait:  Ambulation with single poit cane 200'  Good technique noted More valgus on the right side.  Good stability  with no loss of balance  SBA with gait belt    PATIENT EDUCATION:  Education details: Reviewed HEP, symptom management, edema control.  Person educated: Patient Education method: Explanation, Demonstration, Tactile cues, Verbal cues, and Handouts Education comprehension: verbalized understanding, returned demonstration, verbal cues required, tactile cues required, and needs further education   HOME EXERCISE PROGRAM: Access Code: [email or web address] URL: [email or web address] Date: 03/22/2024 Prepared by: Harlene Cordon  Exercises - Seated Hamstring Stretch  - 2-3 x daily - 7 x weekly - 2 sets - 5 breaths hold - Seated Heel Slide  - 5 x daily - 7 x weekly - 1 sets - 10 reps - Standing Gluteal Sets  - 5 x daily - 7 x weekly - 1 sets - 10 reps - Standing Weight Shift Side to Side Eyes Closed  - 5 x daily - 7 x weekly - 1 sets - 10 reps - Mini Squat  - 5 x daily - 7 x weekly - 1 sets - 10 reps - Sidelying Hip Abduction  - 3 x daily - 7 x weekly - 3 sets - 10 reps   ASSESSMENT:   CLINICAL IMPRESSION: Therapy advanced the patients weights today. She tolerated well. Her range continues to do very well. She tolerated stair training well. Therapy will continue to advance strengthening as tolerated.   Eval:  Patient is a 63 y.o. female 2 weeks s/p L TKA who was seen today for physical therapy evaluation and treatment for decreased range of motion, strength, ability to ambulate, and general functional mobility.  She who presents with expected limitations in all above deficits.  She is using the walker at this time for primary mobility outside the house.  She has expected swelling in her knee.  She would benefit from skilled therapy to return to active lifestyle.    OBJECTIVE IMPAIRMENTS: Abnormal gait, decreased activity  tolerance, decreased mobility, difficulty walking, decreased ROM, decreased strength, increased edema, increased muscle spasms, and pain.    ACTIVITY LIMITATIONS: carrying, lifting, bending, standing, squatting, stairs, transfers, bed mobility, bathing, and locomotion level   PARTICIPATION LIMITATIONS: meal prep, cleaning, laundry, driving, shopping, community activity, occupation, and yard work   PERSONAL FACTORS: 1-2 comorbidities: hip OA  are also affecting patient's functional outcome.    REHAB POTENTIAL: Good   CLINICAL DECISION MAKING: Stable/uncomplicated   EVALUATION COMPLEXITY: Low     GOALS: Goals reviewed with patient? Yes  04/13/2024     SHORT TERM GOALS: Target date:  Patient will increase passive knee flexion to 120 degrees on the left Baseline: Goal status: progressing 7/18    2.  Patient will increase passive left knee extension to full Baseline:  Goal status: INITIAL   3.  Patient will progress off assistive device and ambulate greater than 500 feet without significant antalgic gait Baseline:  Goal status: INITIAL   4.  Patient will be independent with HEP Baseline:  Goal status: INITIAL     LONG TERM GOALS: Target date: 05/11/2024     Patient will go up and down 12 steps with reciprocal gait pattern without increased pain Baseline:  Goal status: INITIAL   2.  Patient will squat pick items up off the ground without increased pain Baseline:  Goal status: INITIAL   3.  Patient will ambulate community distances without pain Baseline:  Goal status: INITIAL   4.  Patient will have complete HEP to promote strengthening Baseline:  Goal status: INITIAL     PLAN:   PT FREQUENCY: 2x/week  PT DURATION: 8 weeks   PLANNED INTERVENTIONS: 97110-Therapeutic exercises, 97530- Therapeutic activity, [id number]- Neuromuscular re-education, 97535- Self Care, [id number]- Manual therapy, [id number]- Gait training, [phone]- Aquatic Therapy, [id number]- , [id number]- Ultrasound,  Patient/Family education, Stair training, Dry Needling, DME instructions, Cryotherapy, and Moist heat    PLAN FOR NEXT SESSION:   Begin with passive range of motion and edema management.  Progress home exercise program table strengthening.  Progressed to functional strengthening such as squats and steps as tolerated.   Jessica C. Hightower PT, DPT 04/14/24 2:56 PM     Referring diagnosis? Left TKA  Treatment diagnosis? (if different than referring diagnosis) LEft knee stiffness  Left knee pain  What was this (referring dx) caused by? [x]  Surgery []  Fall []  Ongoing issue []  Arthritis []  Other: ____________  Laterality: []  Rt [x]  Lt []  Both  Check all possible CPT codes:  *CHOOSE 10 OR LESS*    See Planned Interventions listed in the Plan section of the Evaluation.

## 2024-04-19 ENCOUNTER — Encounter (HOSPITAL_BASED_OUTPATIENT_CLINIC_OR_DEPARTMENT_OTHER): Payer: Self-pay | Admitting: Physical Therapy

## 2024-04-19 ENCOUNTER — Ambulatory Visit (HOSPITAL_BASED_OUTPATIENT_CLINIC_OR_DEPARTMENT_OTHER): Admitting: Physical Therapy

## 2024-04-19 DIAGNOSIS — R2689 Other abnormalities of gait and mobility: Secondary | ICD-10-CM

## 2024-04-19 DIAGNOSIS — M25562 Pain in left knee: Secondary | ICD-10-CM

## 2024-04-19 DIAGNOSIS — M6281 Muscle weakness (generalized): Secondary | ICD-10-CM

## 2024-04-19 NOTE — Therapy (Signed)
 [facility]    OUTPATIENT PHYSICAL THERAPY LOWER EXTREMITY TREATMENT     Patient Name: Latasha [name] MRN: [id number] DOB:1960-11-01, 63 y.o., female Today's Date: 03/14/2024   END OF SESSION:    PT End of Session - 04/19/24 1357     Visit Number 11    Number of Visits 16    Date for PT Re-Evaluation 05/11/24    Authorization Type [employer] mcr    PT Start Time 1316    PT Stop Time 1357    PT Time Calculation (min) 41 min    Activity Tolerance Patient tolerated treatment well    Behavior During Therapy WFL for tasks assessed/performed                       Past Medical History:  Diagnosis Date   Anemia     Anxiety     Arthritis     Asthma     Chronic fatigue     Endometriosis     Family history of osteoporosis     GERD (gastroesophageal reflux disease)     Heart murmur     History of kidney stones     Hypertension     IBS (irritable bowel syndrome)     Obesity     Surgical menopause     Vaginal dryness               Past Surgical History:  Procedure Laterality Date   ABDOMINAL HYSTERECTOMY        tah/bso   KNEE ARTHROPLASTY Left 03/01/2024    Procedure: ARTHROPLASTY, KNEE, TOTAL, USING IMAGELESS COMPUTER-ASSISTED NAVIGATION;  Surgeon: Mardee Lynwood SQUIBB, MD;  Location: [facility];  Service: Orthopedics;  Laterality: Left;   TONSILLECTOMY   1976   TOTAL HIP ARTHROPLASTY Right 10/03/2021    Procedure: TOTAL HIP ARTHROPLASTY;  Surgeon: Mardee Lynwood SQUIBB, MD;  Location: [facility];  Service: Orthopedics;  Laterality: Right;            Patient Active Problem List    Diagnosis Date Noted   History of total knee arthroplasty, left 03/01/2024   Tubular adenoma 02/25/2024   Severe obesity (BMI 35.0-39.9) with comorbidity (HCC) 10/12/2023   Pulmonary nodule 08/21/2023   Hx of total hip arthroplasty, right 10/03/2021   De Quervain's tenosynovitis, right 09/06/2020   B12 deficiency 07/13/2020   Family history of colon cancer 12/01/2017   Benign  essential hypertension 03/01/2016   Herpes zoster without complication 03/01/2016   BP (high blood pressure) 09/05/2015   Osteoporosis, post-menopausal 09/05/2015   Postsurgical menopause 09/05/2015   Class 1 obesity 09/05/2015   GERD (gastroesophageal reflux disease) 09/05/2015   Endometriosis 09/05/2015   Status post total hysterectomy and bilateral salpingo-oophorectomy 09/05/2015   Primary osteoarthritis of both knees 09/28/2014      PCP: Oneil Pinal MD    REFERRING PROVIDER: Fonda Koyanagi  PA-C  Hooten, Lynwood Idalia Raddle., MD (surgeon)   REFERRING DIAG: Left TKA    THERAPY DIAG:  No diagnosis found.   Rationale for Evaluation and Treatment: Rehabilitation   ONSET DATE: DOS  03/01/2024   SUBJECTIVE:    SUBJECTIVE STATEMENT: The sciatic nerve pain has improved. Overall she is still having rouble sleeping   Eval: Pt received PT from 04-02/2024 for bilat knee OA.  She received aquatic therapy  Pt underwent L TKA on 03/01/2024.  At this plan her pain is well-controlled.  She has been at home health and done well.  She did not use the cane some at home.  She practiced using the cane at home with home health.  She is using a walker outside the house.  She has an exercise program that she has been working.  Will review the exercise program today.  Her chief complaint at this time is difficulty sleeping.  She was advised that this is normal at this point.      PERTINENT HISTORY: L TKA on 6/302025 OA R knee and L shoulder. Bilat shoulder pain R THA in 2023 HTN Osteoporosis Anxiety Anemia Chronic fatigue PAIN:  Are you having pain? Yes: NPRS scale: 2 at worst  Pain location: left knee  Pain description: stiff  Aggravating factors: standing and walking, sitting for too long, night time  Relieving factors: medication, Ice , rest    PRECAUTIONS: None   RED FLAGS: None           WEIGHT BEARING RESTRICTIONS: No   FALLS:  Has patient fallen in last 6 months? No    LIVING ENVIRONMENT: 5 steps into the house   OCCUPATION:  Retired    PLOF: Independent   PATIENT GOALS:  To be able to her mother son dance by October at her sons wedding/ to be able to walk      OBJECTIVE:  Note: Objective measures were completed at Evaluation unless otherwise noted.   DIAGNOSTIC FINDINGS:  Nothing post op    PATIENT SURVEYS:  LEFS  Extreme difficulty/unable (0), Quite a bit of difficulty (1), Moderate difficulty (2), Little difficulty (3), No difficulty (4) Survey date:    Any of your usual work, housework or school activities   2. Usual hobbies, recreational or sporting activities   3. Getting into/out of the bath   4. Walking between rooms   5. Putting on socks/shoes   6. Squatting    7. Lifting an object, like a bag of groceries from the floor   8. Performing light activities around your home   9. Performing heavy activities around your home   10. Getting into/out of a car   11. Walking 2 blocks   12. Walking 1 mile   13. Going up/down 10 stairs (1 flight)   14. Standing for 1 hour   15.  sitting for 1 hour   16. Running on even ground   17. Running on uneven ground   18. Making sharp turns while running fast   19. Hopping    20. Rolling over in bed   Score total:  23/80      COGNITION: Overall cognitive status: Within functional limits for tasks assessed                               SENSATION:   EDEMA:  Circumferential: 44.5 R  48.7 L    MUSCLE LENGTH:   POSTURE: rounded shoulders and forward head   PALPATION: No unexpected TTP    LOWER EXTREMITY ROM:   Passive ROM Right eval Left eval left left Left  8/11  L  8/18  Hip flexion          Hip extension          Hip abduction          Hip adduction          Hip internal rotation          Hip external rotation  Knee flexion   96  100 103 118  123  Knee extension    -3 -3     Ankle dorsiflexion          Ankle plantarflexion          Ankle inversion           Ankle eversion           (Blank rows = not tested)   LOWER EXTREMITY MMT:   MMT Right eval Left eval  Hip flexion      Hip extension      Hip abduction      Hip adduction      Hip internal rotation      Hip external rotation      Knee flexion      Knee extension      Ankle dorsiflexion      Ankle plantarflexion      Ankle inversion      Ankle eversion       (Blank rows = not tested) Not tested 2nd to recent surgery       GAIT: Decreased left hip flexion Forward trunk flexion  Decreased left single leg                                                                                                                                    TREATMENT DATE:   8/18 Manual:  Trigger point release to posterior knee and quad  Review of self trigger point release to the quad.  Reviewed use of her muscle roller   There-ex:  SLR 3x10  LAQ 3x10 2lb  SAQ 3x10 2 lb   There-act:  Step up 4 inch 2x10  Squat 2x10 in low range   8/13 Manual:  Trigger point release to posterior knee and quad  Review of self trigger point release to the quad.  Reviewed use of her muscle roller   There-ex:  SLR 3x10  LAQ 3x10 1lb  SAQ 3x10 1 lb   There-act:  Step up 4 inch 2x10  Squat 2x10 in low range   Neuro-re-ed:   Heel/toe rock 2x12   8/11 Manual:  Trigger point release to posterior knee and quad  Review of self trigger point release to the quad.   There-ex:  SLR 3x10  LAQ 3x10 SAQ 3x10   There-act:  Step up 4 inch 2x10  Squat 2x10 in low range    8/7 Bike 5:30 no resistance Seated hamstring stretch Supine knee flexion- towel held behind knee, achieved 120 deg today Roller LT hip Sidelying: clam, hip abd with knee ext at 45 deg hip flexion, hip flexion/ext with knee exteded Ab set+marching stairs  8/4 Bike- able to do full revolutions Step ups & downs, forward/lateral, 4 step- cues for core/pelvis control  Discussed transfer of activity to stairs at  home Sidelying roller to Lt hip Clams on Lt side Supine ab set, +  marching with cues to breathe  Reviewed HEP printout & discussed time for completion  8/1 PROM L knee  Measured 115 passive flexion Roller to quad  SAQ 2.5# 3 2x10 LAQ 3 2x10 STS from plinth x10 Gait with SPC x265ft Nu-step L3   07/28 Manual:Manual:  Edema massage Extension stretching with distraction    There-ex:  SLR 3x10 RPE of 3 not increased 2nd to sciatica  SAQ 3x10  Quad set 3x15   Neuro-re-ed:  Weight shift on scale with cuing to stay equal  3x10   Gait:  Ambulation with single poit cane 200'  Good technique noted More valgus on the right side.  Good stability with no loss of balance  SBA with gait belt    PATIENT EDUCATION:  Education details: Reviewed HEP, symptom management, edema control.  Person educated: Patient Education method: Explanation, Demonstration, Tactile cues, Verbal cues, and Handouts Education comprehension: verbalized understanding, returned demonstration, verbal cues required, tactile cues required, and needs further education   HOME EXERCISE PROGRAM: Access Code: [email or web address] URL: [email or web address] Date: 03/22/2024 Prepared by: Harlene Cordon  Exercises - Seated Hamstring Stretch  - 2-3 x daily - 7 x weekly - 2 sets - 5 breaths hold - Seated Heel Slide  - 5 x daily - 7 x weekly - 1 sets - 10 reps - Standing Gluteal Sets  - 5 x daily - 7 x weekly - 1 sets - 10 reps - Standing Weight Shift Side to Side Eyes Closed  - 5 x daily - 7 x weekly - 1 sets - 10 reps - Mini Squat  - 5 x daily - 7 x weekly - 1 sets - 10 reps - Sidelying Hip Abduction  - 3 x daily - 7 x weekly - 3 sets - 10 reps   ASSESSMENT:   CLINICAL IMPRESSION: Therapy advanced the patients weight today using RPE. She tolerated well. She is making great progress. She is still having trouble with sleeping. She was encouraged to continue to work on doing a regular routine of activity  so she will be more tired. 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 Therapy will continue to progress as tolerated.  Eval:  Patient is a 63 y.o. female 2 weeks s/p L TKA who was seen today for physical therapy evaluation and treatment for decreased range of motion, strength, ability to ambulate, and general functional mobility.  She who presents with expected limitations in all above deficits.  She is using the walker at this time for primary mobility outside the house.  She has expected swelling in her knee.  She would benefit from skilled therapy to return to active lifestyle.    OBJECTIVE IMPAIRMENTS: Abnormal gait, decreased activity tolerance, decreased mobility, difficulty walking, decreased ROM, decreased strength, increased edema, increased muscle spasms, and pain.    ACTIVITY LIMITATIONS: carrying, lifting, bending, standing, squatting, stairs, transfers, bed mobility, bathing, and locomotion level   PARTICIPATION LIMITATIONS: meal prep, cleaning, laundry, driving, shopping, community activity, occupation, and yard work   PERSONAL FACTORS: 1-2 comorbidities: hip OA  are also affecting patient's functional outcome.    REHAB POTENTIAL: Good   CLINICAL DECISION MAKING: Stable/uncomplicated   EVALUATION COMPLEXITY: Low     GOALS: Goals reviewed with patient? Yes  04/13/2024     SHORT TERM GOALS: Target date:  Patient will increase passive knee flexion to 120 degrees on the left Baseline: Goal status: progressing 7/18    2.  Patient will increase passive left knee extension to  full Baseline:  Goal status: INITIAL   3.  Patient will progress off assistive device and ambulate greater than 500 feet without significant antalgic gait Baseline:  Goal status: INITIAL   4.  Patient will be independent with HEP Baseline:  Goal status: INITIAL     LONG TERM GOALS: Target date: 05/11/2024     Patient will go up and down 12 steps with reciprocal gait pattern without increased  pain Baseline:  Goal status: INITIAL   2.  Patient will squat pick items up off the ground without increased pain Baseline:  Goal status: INITIAL   3.  Patient will ambulate community distances without pain Baseline:  Goal status: INITIAL   4.  Patient will have complete HEP to promote strengthening Baseline:  Goal status: INITIAL     PLAN:   PT FREQUENCY: 2x/week   PT DURATION: 8 weeks   PLANNED INTERVENTIONS: 97110-Therapeutic exercises, 97530- Therapeutic activity, [id number]- Neuromuscular re-education, 97535- Self Care, [id number]- Manual therapy, [id number]- Gait training, [phone]- Aquatic Therapy, [id number]- , [id number]- Ultrasound, Patient/Family education, Stair training, Dry Needling, DME instructions, Cryotherapy, and Moist heat    PLAN FOR NEXT SESSION:   Begin with passive range of motion and edema management.  Progress home exercise program table strengthening.  Progressed to functional strengthening such as squats and steps as tolerated.   Jessica C. Hightower PT, DPT 04/19/24 5:11 PM     Referring diagnosis? Left TKA  Treatment diagnosis? (if different than referring diagnosis) LEft knee stiffness  Left knee pain  What was this (referring dx) caused by? [x]  Surgery []  Fall []  Ongoing issue []  Arthritis []  Other: ____________  Laterality: []  Rt [x]  Lt []  Both  Check all possible CPT codes:  *CHOOSE 10 OR LESS*    See Planned Interventions listed in the Plan section of the Evaluation.

## 2024-04-20 ENCOUNTER — Encounter (HOSPITAL_BASED_OUTPATIENT_CLINIC_OR_DEPARTMENT_OTHER): Payer: Self-pay | Admitting: Physical Therapy

## 2024-04-22 ENCOUNTER — Encounter (HOSPITAL_BASED_OUTPATIENT_CLINIC_OR_DEPARTMENT_OTHER): Payer: Self-pay | Admitting: Physical Therapy

## 2024-04-22 ENCOUNTER — Ambulatory Visit (HOSPITAL_BASED_OUTPATIENT_CLINIC_OR_DEPARTMENT_OTHER): Admitting: Physical Therapy

## 2024-04-22 DIAGNOSIS — R2689 Other abnormalities of gait and mobility: Secondary | ICD-10-CM | POA: Diagnosis not present

## 2024-04-22 DIAGNOSIS — M6281 Muscle weakness (generalized): Secondary | ICD-10-CM

## 2024-04-22 DIAGNOSIS — M25562 Pain in left knee: Secondary | ICD-10-CM

## 2024-04-22 NOTE — Therapy (Signed)
 [facility]    OUTPATIENT PHYSICAL THERAPY LOWER EXTREMITY TREATMENT     Patient Name: Latasha [name] MRN: [id number] DOB:17-Aug-1961, 63 y.o., female Today's Date: 03/14/2024   END OF SESSION:    PT End of Session - 04/22/24 1353     Visit Number 12    Number of Visits 16    Date for PT Re-Evaluation 05/11/24    Authorization Type [employer] m[employer]    Authorization - Number of Visits 20    PT Start Time 1345    PT Stop Time 1428    PT Time Calculation (min) 43 min    Activity Tolerance Patient tolerated treatment well    Behavior During Therapy [facility] for tasks assessed/performed                 Patient Active Problem List    Diagnosis Date Noted   History of total knee arthroplasty, left 03/01/2024   Tubular adenoma 02/25/2024   Severe obesity (BMI 35.0-39.9) with comorbidity (HCC) 10/12/2023   Pulmonary nodule 08/21/2023   Hx of total hip arthroplasty, right 10/03/2021   De Quervain's tenosynovitis, right 09/06/2020   B12 deficiency 07/13/2020   Family history of colon cancer 12/01/2017   Benign essential hypertension 03/01/2016   Herpes zoster without complication 03/01/2016   BP (high blood pressure) 09/05/2015   Osteoporosis, post-menopausal 09/05/2015   Postsurgical menopause 09/05/2015   Class 1 obesity 09/05/2015   GERD (gastroesophageal reflux disease) 09/05/2015   Endometriosis 09/05/2015   Status post total hysterectomy and bilateral salpingo-oophorectomy 09/05/2015   Primary osteoarthritis of both knees 09/28/2014      PCP: Latasha Pinal [name]    REFERRING PROVIDER: Fonda Koyanagi  PA-C  Latasha [name] (surgeon)   REFERRING DIAG: Left TKA    THERAPY DIAG:  No diagnosis found.   Rationale for Evaluation and Treatment: Rehabilitation   ONSET DATE: DOS  03/01/2024   SUBJECTIVE:    SUBJECTIVE STATEMENT: Had a really bad night and took the first Oxy since surgery. Rt leg is hindering the left  Eval: Pt received PT from  04-02/2024 for bilat knee OA.  She received aquatic therapy  Pt underwent L TKA on 03/01/2024.  At this plan her pain is well-controlled.  She has been at home health and done well.  She did not use the cane some at home.  She practiced using the cane at home with home health.  She is using a walker outside the house.  She has an exercise program that she has been working.  Will review the exercise program today.  Her chief complaint at this time is difficulty sleeping.  She was advised that this is normal at this point.      PERTINENT HISTORY: L TKA on 6/302025 OA R knee and L shoulder. Bilat shoulder pain R THA in 2023 HTN Osteoporosis Anxiety Anemia Chronic fatigue PAIN:  Are you having pain? Yes: NPRS scale: 3-4 at worst - because of right knee Pain location: left knee  Pain description: stiff  Aggravating factors: standing and walking, sitting for too long, night time  Relieving factors: medication, Ice , rest    PRECAUTIONS: None   RED FLAGS: None           WEIGHT BEARING RESTRICTIONS: No   FALLS:  Has patient fallen in last 6 months? No   LIVING ENVIRONMENT: 5 steps into the house   OCCUPATION:  Retired    PLOF: Independent   PATIENT GOALS:  To be able to her mother son dance by October at her sons wedding/ to be able to walk      OBJECTIVE:  Note: Objective measures were completed at Evaluation unless otherwise noted.   DIAGNOSTIC FINDINGS:  Nothing post op    PATIENT SURVEYS:  LEFS  Extreme difficulty/unable (0), Quite a bit of difficulty (1), Moderate difficulty (2), Little difficulty (3), No difficulty (4) Survey date:  eval  Any of your usual work, housework or school activities   2. Usual hobbies, recreational or sporting activities   3. Getting into/out of the bath   4. Walking between rooms   5. Putting on socks/shoes   6. Squatting    7. Lifting an object, like a bag of groceries from the floor   8. Performing light activities around your  home   9. Performing heavy activities around your home   10. Getting into/out of a car   11. Walking 2 blocks   12. Walking 1 mile   13. Going up/down 10 stairs (1 flight)   14. Standing for 1 hour   15.  sitting for 1 hour   16. Running on even ground   17. Running on uneven ground   18. Making sharp turns while running fast   19. Hopping    20. Rolling over in bed   Score total:  23/80      COGNITION: Overall cognitive status: Within functional limits for tasks assessed                               SENSATION:   EDEMA:  Circumferential: 44.5 R  48.7 L    MUSCLE LENGTH:   POSTURE: rounded shoulders and forward head   PALPATION: No unexpected TTP    LOWER EXTREMITY ROM:   Passive ROM Right eval Left eval left left Left  8/11  L  8/18  Hip flexion          Hip extension          Hip abduction          Hip adduction          Hip internal rotation          Hip external rotation          Knee flexion   96  100 103 118  123  Knee extension    -3 -3     Ankle dorsiflexion          Ankle plantarflexion          Ankle inversion          Ankle eversion           (Blank rows = not tested)   LOWER EXTREMITY MMT:   MMT (lb) Right 8/21 Left 8/21  Hip flexion      Hip extension      Hip abduction      Hip adduction      Hip internal rotation      Hip external rotation      Knee flexion  35.3 38.8   Knee extension (age norm 60lb)   46.0 46.1  Ankle dorsiflexion      Ankle plantarflexion      Ankle inversion      Ankle eversion       (Blank rows = not tested)       GAIT: Decreased left hip flexion Forward trunk flexion  Decreased left single leg                                                                                                                                    TREATMENT DATE:   8/21 Nu step 6 min Manual to right hip Discussion regarding TKA on Rt   8/18 Manual:  Trigger point release to posterior knee and quad  Review of self  trigger point release to the quad.  Reviewed use of her muscle roller   There-ex:  SLR 3x10  LAQ 3x10 2lb  SAQ 3x10 2 lb   There-act:  Step up 4 inch 2x10  Heel/toe raise 2x12     PATIENT EDUCATION:  Education details: Reviewed HEP, symptom management, edema control.  Person educated: Patient Education method: Explanation, Demonstration, Tactile cues, Verbal cues, and Handouts Education comprehension: verbalized understanding, returned demonstration, verbal cues required, tactile cues required, and needs further education   HOME EXERCISE PROGRAM: Access Code: [email or web address] URL: [email or web address] Date: 03/22/2024 Prepared by: Harlene Cordon  Exercises - Seated Hamstring Stretch  - 2-3 x daily - 7 x weekly - 2 sets - 5 breaths hold - Seated Heel Slide  - 5 x daily - 7 x weekly - 1 sets - 10 reps - Standing Gluteal Sets  - 5 x daily - 7 x weekly - 1 sets - 10 reps - Standing Weight Shift Side to Side Eyes Closed  - 5 x daily - 7 x weekly - 1 sets - 10 reps - Mini Squat  - 5 x daily - 7 x weekly - 1 sets - 10 reps - Sidelying Hip Abduction  - 3 x daily - 7 x weekly - 3 sets - 10 reps   ASSESSMENT:   CLINICAL IMPRESSION: Most of our time today was spent discussing healing and progressive strengthening of left leg and preparedness for TKA on Right. I do believe she will benefit from intervention sooner rather than later as the strength in the left leg is already equal to that of the right and pain in the right is holding her back from progressing CKC tolerance. Will plan to request POC extension to continue in pool weekly as well as land in order to preserve strength in right leg to avoid compensatory patterns that could be detrimental to the left.   Eval:  Patient is a 63 y.o. female 2 weeks s/p L TKA who was seen today for physical therapy evaluation and treatment for decreased range of motion, strength, ability to ambulate, and general functional mobility.  She who  presents with expected limitations in all above deficits.  She is using the walker at this time for primary mobility outside the house.  She has expected swelling in her knee.  She would benefit from skilled therapy to return to active lifestyle.    OBJECTIVE IMPAIRMENTS: Abnormal gait, decreased activity tolerance, decreased mobility, difficulty walking, decreased ROM, decreased strength, increased  edema, increased muscle spasms, and pain.    ACTIVITY LIMITATIONS: carrying, lifting, bending, standing, squatting, stairs, transfers, bed mobility, bathing, and locomotion level   PARTICIPATION LIMITATIONS: meal prep, cleaning, laundry, driving, shopping, community activity, occupation, and yard work   PERSONAL FACTORS: 1-2 comorbidities: hip OA  are also affecting patient's functional outcome.    REHAB POTENTIAL: Good   CLINICAL DECISION MAKING: Stable/uncomplicated   EVALUATION COMPLEXITY: Low     GOALS: Goals reviewed with patient? Yes  04/13/2024     SHORT TERM GOALS: Target date:  Patient will increase passive knee flexion to 120 degrees on the left Baseline: Goal status: progressing 7/18    2.  Patient will increase passive left knee extension to full Baseline:  Goal status: INITIAL   3.  Patient will progress off assistive device and ambulate greater than 500 feet without significant antalgic gait Baseline:  Goal status: INITIAL   4.  Patient will be independent with HEP Baseline:  Goal status: INITIAL     LONG TERM GOALS: Target date: 05/11/2024     Patient will go up and down 12 steps with reciprocal gait pattern without increased pain Baseline:  Goal status: INITIAL   2.  Patient will squat pick items up off the ground without increased pain Baseline:  Goal status: INITIAL   3.  Patient will ambulate community distances without pain Baseline:  Goal status: INITIAL   4.  Patient will have complete HEP to promote strengthening Baseline:  Goal status:  INITIAL     PLAN:   PT FREQUENCY: 2x/week   PT DURATION: 8 weeks   PLANNED INTERVENTIONS: 97110-Therapeutic exercises, 97530- Therapeutic activity, [id number]- Neuromuscular re-education, 97535- Self Care, 02859- Manual therapy, [id number]- Gait training, [phone]- Aquatic Therapy, [id number]- , [id number]- Ultrasound, Patient/Family education, Stair training, Dry Needling, DME instructions, Cryotherapy, and Moist heat    PLAN FOR NEXT SESSION:   Begin with passive range of motion and edema management.  Progress home exercise program table strengthening.  Progressed to functional strengthening such as squats and steps as tolerated.   Cyra Spader C. Khadijah Mastrianni PT, DPT 04/22/24 2:39 PM      Referring diagnosis? Left TKA  Treatment diagnosis? (if different than referring diagnosis) LEft knee stiffness  Left knee pain  What was this (referring dx) caused by? [x]  Surgery []  Fall []  Ongoing issue []  Arthritis []  Other: ____________  Laterality: []  Rt [x]  Lt []  Both  Check all possible CPT codes:  *CHOOSE 10 OR LESS*    See Planned Interventions listed in the Plan section of the Evaluation.

## 2024-04-26 ENCOUNTER — Ambulatory Visit (HOSPITAL_BASED_OUTPATIENT_CLINIC_OR_DEPARTMENT_OTHER): Admitting: Physical Therapy

## 2024-04-26 DIAGNOSIS — M6281 Muscle weakness (generalized): Secondary | ICD-10-CM

## 2024-04-26 DIAGNOSIS — M25562 Pain in left knee: Secondary | ICD-10-CM

## 2024-04-26 DIAGNOSIS — R2689 Other abnormalities of gait and mobility: Secondary | ICD-10-CM

## 2024-04-26 NOTE — Therapy (Signed)
 [facility]    OUTPATIENT PHYSICAL THERAPY LOWER EXTREMITY TREATMENT     Patient Name: Latasha [name] MRN: [id number] DOB:07/12/1961, 63 y.o., female Today's Date: 03/14/2024   END OF SESSION:             Patient Active Problem List    Diagnosis Date Noted   History of total knee arthroplasty, left 03/01/2024   Tubular adenoma 02/25/2024   Severe obesity (BMI 35.0-39.9) with comorbidity (HCC) 10/12/2023   Pulmonary nodule 08/21/2023   Hx of total hip arthroplasty, right 10/03/2021   De Quervain's tenosynovitis, right 09/06/2020   B12 deficiency 07/13/2020   Family history of colon cancer 12/01/2017   Benign essential hypertension 03/01/2016   Herpes zoster without complication 03/01/2016   BP (high blood pressure) 09/05/2015   Osteoporosis, post-menopausal 09/05/2015   Postsurgical menopause 09/05/2015   Class 1 obesity 09/05/2015   GERD (gastroesophageal reflux disease) 09/05/2015   Endometriosis 09/05/2015   Status post total hysterectomy and bilateral salpingo-oophorectomy 09/05/2015   Primary osteoarthritis of both knees 09/28/2014      PCP: Oneil Pinal MD    REFERRING PROVIDER: Fonda Koyanagi  PA-C  Hooten, Lynwood Idalia Raddle., MD (surgeon)   REFERRING DIAG: Left TKA    THERAPY DIAG:  No diagnosis found.   Rationale for Evaluation and Treatment: Rehabilitation   ONSET DATE: DOS  03/01/2024   SUBJECTIVE:    SUBJECTIVE STATEMENT: Had a really bad night and took the first Oxy since surgery. Rt leg is hindering the left  Eval: Pt received PT from 04-02/2024 for bilat knee OA.  She received aquatic therapy  Pt underwent L TKA on 03/01/2024.  At this plan her pain is well-controlled.  She has been at home health and done well.  She did not use the cane some at home.  She practiced using the cane at home with home health.  She is using a walker outside the house.  She has an exercise program that she has been working.  Will review the exercise  program today.  Her chief complaint at this time is difficulty sleeping.  She was advised that this is normal at this point.      PERTINENT HISTORY: L TKA on 6/302025 OA R knee and L shoulder. Bilat shoulder pain R THA in 2023 HTN Osteoporosis Anxiety Anemia Chronic fatigue PAIN:  Are you having pain? Yes: NPRS scale: 3-4 at worst - because of right knee Pain location: left knee  Pain description: stiff  Aggravating factors: standing and walking, sitting for too long, night time  Relieving factors: medication, Ice , rest    PRECAUTIONS: None   RED FLAGS: None           WEIGHT BEARING RESTRICTIONS: No   FALLS:  Has patient fallen in last 6 months? No   LIVING ENVIRONMENT: 5 steps into the house   OCCUPATION:  Retired    PLOF: Independent   PATIENT GOALS:  To be able to her mother son dance by October at her sons wedding/ to be able to walk      OBJECTIVE:  Note: Objective measures were completed at Evaluation unless otherwise noted.   DIAGNOSTIC FINDINGS:  Nothing post op    PATIENT SURVEYS:  LEFS  Extreme difficulty/unable (0), Quite a bit of difficulty (1), Moderate difficulty (2), Little difficulty (3), No difficulty (4) Survey date:  eval  Any of your usual work, housework or school activities   2. Usual hobbies, recreational or sporting activities  3. Getting into/out of the bath   4. Walking between rooms   5. Putting on socks/shoes   6. Squatting    7. Lifting an object, like a bag of groceries from the floor   8. Performing light activities around your home   9. Performing heavy activities around your home   10. Getting into/out of a car   11. Walking 2 blocks   12. Walking 1 mile   13. Going up/down 10 stairs (1 flight)   14. Standing for 1 hour   15.  sitting for 1 hour   16. Running on even ground   17. Running on uneven ground   18. Making sharp turns while running fast   19. Hopping    20. Rolling over in bed   Score total:  23/80       COGNITION: Overall cognitive status: Within functional limits for tasks assessed                               SENSATION:   EDEMA:  Circumferential: 44.5 R  48.7 L    MUSCLE LENGTH:   POSTURE: rounded shoulders and forward head   PALPATION: No unexpected TTP    LOWER EXTREMITY ROM:   Passive ROM Right eval Left eval left left Left  8/11  L  8/18  Hip flexion          Hip extension          Hip abduction          Hip adduction          Hip internal rotation          Hip external rotation          Knee flexion   96  100 103 118  123  Knee extension    -3 -3     Ankle dorsiflexion          Ankle plantarflexion          Ankle inversion          Ankle eversion           (Blank rows = not tested)   LOWER EXTREMITY MMT:   MMT (lb) Right 8/21 Left 8/21  Hip flexion      Hip extension      Hip abduction      Hip adduction      Hip internal rotation      Hip external rotation      Knee flexion  35.3 38.8   Knee extension (age norm 60lb)   46.0 46.1  Ankle dorsiflexion      Ankle plantarflexion      Ankle inversion      Ankle eversion       (Blank rows = not tested)       GAIT: Decreased left hip flexion Forward trunk flexion  Decreased left single leg  TREATMENT DATE:   8/21 Nu step 6 min Manual to right hip Discussion regarding TKA on Rt   8/18 Manual:  Trigger point release to posterior knee and quad  Review of self trigger point release to the quad.  Reviewed use of her muscle roller   There-ex:  SLR 3x10  LAQ 3x10 2lb  SAQ 3x10 2 lb   There-act:  Step up 4 inch 2x10  Heel/toe raise 2x12     PATIENT EDUCATION:  Education details: Reviewed HEP, symptom management, edema control.  Person educated: Patient Education method: Explanation, Demonstration, Tactile cues, Verbal cues, and  Handouts Education comprehension: verbalized understanding, returned demonstration, verbal cues required, tactile cues required, and needs further education   HOME EXERCISE PROGRAM: Access Code: [email or web address] URL: [email or web address] Date: 03/22/2024 Prepared by: Harlene Cordon  Exercises - Seated Hamstring Stretch  - 2-3 x daily - 7 x weekly - 2 sets - 5 breaths hold - Seated Heel Slide  - 5 x daily - 7 x weekly - 1 sets - 10 reps - Standing Gluteal Sets  - 5 x daily - 7 x weekly - 1 sets - 10 reps - Standing Weight Shift Side to Side Eyes Closed  - 5 x daily - 7 x weekly - 1 sets - 10 reps - Mini Squat  - 5 x daily - 7 x weekly - 1 sets - 10 reps - Sidelying Hip Abduction  - 3 x daily - 7 x weekly - 3 sets - 10 reps   ASSESSMENT:   CLINICAL IMPRESSION: Most of our time today was spent discussing healing and progressive strengthening of left leg and preparedness for TKA on Right. I do believe she will benefit from intervention sooner rather than later as the strength in the left leg is already equal to that of the right and pain in the right is holding her back from progressing CKC tolerance. Will plan to request POC extension to continue in pool weekly as well as land in order to preserve strength in right leg to avoid compensatory patterns that could be detrimental to the left.   Eval:  Patient is a 63 y.o. female 2 weeks s/p L TKA who was seen today for physical therapy evaluation and treatment for decreased range of motion, strength, ability to ambulate, and general functional mobility.  She who presents with expected limitations in all above deficits.  She is using the walker at this time for primary mobility outside the house.  She has expected swelling in her knee.  She would benefit from skilled therapy to return to active lifestyle.    OBJECTIVE IMPAIRMENTS: Abnormal gait, decreased activity tolerance, decreased mobility, difficulty walking, decreased ROM, decreased  strength, increased edema, increased muscle spasms, and pain.    ACTIVITY LIMITATIONS: carrying, lifting, bending, standing, squatting, stairs, transfers, bed mobility, bathing, and locomotion level   PARTICIPATION LIMITATIONS: meal prep, cleaning, laundry, driving, shopping, community activity, occupation, and yard work   PERSONAL FACTORS: 1-2 comorbidities: hip OA  are also affecting patient's functional outcome.    REHAB POTENTIAL: Good   CLINICAL DECISION MAKING: Stable/uncomplicated   EVALUATION COMPLEXITY: Low     GOALS: Goals reviewed with patient? Yes  04/13/2024     SHORT TERM GOALS: Target date:  Patient will increase passive knee flexion to 120 degrees on the left Baseline: Goal status: progressing 7/18    2.  Patient will increase passive left knee extension to full Baseline:  Goal status: INITIAL   3.  Patient  will progress off assistive device and ambulate greater than 500 feet without significant antalgic gait Baseline:  Goal status: INITIAL   4.  Patient will be independent with HEP Baseline:  Goal status: INITIAL     LONG TERM GOALS: Target date: 05/11/2024     Patient will go up and down 12 steps with reciprocal gait pattern without increased pain Baseline:  Goal status: INITIAL   2.  Patient will squat pick items up off the ground without increased pain Baseline:  Goal status: INITIAL   3.  Patient will ambulate community distances without pain Baseline:  Goal status: INITIAL   4.  Patient will have complete HEP to promote strengthening Baseline:  Goal status: INITIAL     PLAN:   PT FREQUENCY: 2x/week   PT DURATION: 8 weeks   PLANNED INTERVENTIONS: 97110-Therapeutic exercises, 97530- Therapeutic activity, [id number]- Neuromuscular re-education, 97535- Self Care, [id number]- Manual therapy, [id number]- Gait training, [phone]- Aquatic Therapy, [id number]- , [id number]- Ultrasound, Patient/Family education, Stair training, Dry Needling, DME instructions,  Cryotherapy, and Moist heat    PLAN FOR NEXT SESSION:   Begin with passive range of motion and edema management.  Progress home exercise program table strengthening.  Progressed to functional strengthening such as squats and steps as tolerated.   Alm Don PT DPT 04/26/24 1:54 PM      Referring diagnosis? Left TKA  Treatment diagnosis? (if different than referring diagnosis) LEft knee stiffness  Left knee pain  What was this (referring dx) caused by? [x]  Surgery []  Fall []  Ongoing issue []  Arthritis []  Other: ____________  Laterality: []  Rt [x]  Lt []  Both  Check all possible CPT codes:  *CHOOSE 10 OR LESS*    See Planned Interventions listed in the Plan section of the Evaluation.

## 2024-04-27 ENCOUNTER — Other Ambulatory Visit: Payer: Self-pay | Admitting: Internal Medicine

## 2024-04-27 ENCOUNTER — Encounter (HOSPITAL_BASED_OUTPATIENT_CLINIC_OR_DEPARTMENT_OTHER): Payer: Self-pay | Admitting: Physical Therapy

## 2024-04-27 DIAGNOSIS — Z1231 Encounter for screening mammogram for malignant neoplasm of breast: Secondary | ICD-10-CM

## 2024-04-29 ENCOUNTER — Encounter (HOSPITAL_BASED_OUTPATIENT_CLINIC_OR_DEPARTMENT_OTHER): Payer: Self-pay | Admitting: Physical Therapy

## 2024-04-29 ENCOUNTER — Ambulatory Visit (HOSPITAL_BASED_OUTPATIENT_CLINIC_OR_DEPARTMENT_OTHER): Admitting: Physical Therapy

## 2024-04-29 DIAGNOSIS — R2689 Other abnormalities of gait and mobility: Secondary | ICD-10-CM | POA: Diagnosis not present

## 2024-04-29 DIAGNOSIS — M6281 Muscle weakness (generalized): Secondary | ICD-10-CM

## 2024-04-29 DIAGNOSIS — M25562 Pain in left knee: Secondary | ICD-10-CM

## 2024-04-29 NOTE — Therapy (Signed)
 [facility]    OUTPATIENT PHYSICAL THERAPY LOWER EXTREMITY TREATMENT     Patient Name: Latasha [name] MRN: [id number] DOB:12/15/1960, 63 y.o., female Today's Date: 03/14/2024   END OF SESSION:    PT End of Session - 04/29/24 1353     Visit Number 14    Number of Visits 16    Date for PT Re-Evaluation 05/11/24    Authorization Time Period 20 visits authorized from [id number]    Authorization - Number of Visits 20    PT Start Time 1345    PT Stop Time 1431    PT Time Calculation (min) 46 min    Activity Tolerance Patient tolerated treatment well    Behavior During Therapy [facility] for tasks assessed/performed                  Patient Active Problem List    Diagnosis Date Noted   History of total knee arthroplasty, left 03/01/2024   Tubular adenoma 02/25/2024   Severe obesity (BMI 35.0-39.9) with comorbidity (HCC) 10/12/2023   Pulmonary nodule 08/21/2023   Hx of total hip arthroplasty, right 10/03/2021   De Quervain's tenosynovitis, right 09/06/2020   B12 deficiency 07/13/2020   Family history of colon cancer 12/01/2017   Benign essential hypertension 03/01/2016   Herpes zoster without complication 03/01/2016   BP (high blood pressure) 09/05/2015   Osteoporosis, post-menopausal 09/05/2015   Postsurgical menopause 09/05/2015   Class 1 obesity 09/05/2015   GERD (gastroesophageal reflux disease) 09/05/2015   Endometriosis 09/05/2015   Status post total hysterectomy and bilateral salpingo-oophorectomy 09/05/2015   Primary osteoarthritis of both knees 09/28/2014      PCP: Oneil Pinal MD    REFERRING PROVIDER: Fonda Koyanagi  PA-C  Hooten, Lynwood Idalia Raddle., MD (surgeon)   REFERRING DIAG: Left TKA    THERAPY DIAG:  No diagnosis found.   Rationale for Evaluation and Treatment: Rehabilitation   ONSET DATE: DOS  03/01/2024   SUBJECTIVE:    SUBJECTIVE STATEMENT: I think I slept on my shoulder funny last night, it is really sore. Hot water on  it in the shower helped some.   Eval: Pt received PT from 04-02/2024 for bilat knee OA.  She received aquatic therapy  Pt underwent L TKA on 03/01/2024.  At this plan her pain is well-controlled.  She has been at home health and done well.  She did not use the cane some at home.  She practiced using the cane at home with home health.  She is using a walker outside the house.  She has an exercise program that she has been working.  Will review the exercise program today.  Her chief complaint at this time is difficulty sleeping.  She was advised that this is normal at this point.      PERTINENT HISTORY: L TKA on 6/302025 OA R knee and L shoulder. Bilat shoulder pain R THA in 2023 HTN Osteoporosis Anxiety Anemia Chronic fatigue PAIN:  Are you having pain? Yes: NPRS scale: 3-4 at worst - because of right knee Pain location: left knee  Pain description: stiff  Aggravating factors: standing and walking, sitting for too long, night time  Relieving factors: medication, Ice , rest    PRECAUTIONS: None   RED FLAGS: None           WEIGHT BEARING RESTRICTIONS: No   FALLS:  Has patient fallen in last 6 months? No   LIVING ENVIRONMENT: 5 steps into the house   OCCUPATION:  Retired    PLOF: Independent   PATIENT GOALS:  To be able to her mother son dance by October at her sons wedding/ to be able to walk      OBJECTIVE:  Note: Objective measures were completed at Evaluation unless otherwise noted.   DIAGNOSTIC FINDINGS:  Nothing post op    PATIENT SURVEYS:  LEFS  Extreme difficulty/unable (0), Quite a bit of difficulty (1), Moderate difficulty (2), Little difficulty (3), No difficulty (4) Survey date:  eval 8/28  Any of your usual work, housework or school activities  4  2. Usual hobbies, recreational or sporting activities  3  3. Getting into/out of the bath  3  4. Walking between rooms  3  5. Putting on socks/shoes  3  6. Squatting   3  7. Lifting an object, like a bag  of groceries from the floor  2  8. Performing light activities around your home  1  9. Performing heavy activities around your home  2  10. Getting into/out of a car  3  11. Walking 2 blocks  1  12. Walking 1 mile  1  13. Going up/down 10 stairs (1 flight)  1  14. Standing for 1 hour  1  15.  sitting for 1 hour  1  16. Running on even ground  1  17. Running on uneven ground  1  18. Making sharp turns while running fast  1  19. Hopping   1  20. Rolling over in bed  2  Score total:  23/80 38/80      COGNITION: Overall cognitive status: Within functional limits for tasks assessed                               SENSATION:   EDEMA:  Circumferential: 44.5 R  48.7 L    MUSCLE LENGTH:   POSTURE: rounded shoulders and forward head   PALPATION: No unexpected TTP    LOWER EXTREMITY ROM:   Passive ROM Right eval Left eval left left Left  8/11  L  8/18  Hip flexion          Hip extension          Hip abduction          Hip adduction          Hip internal rotation          Hip external rotation          Knee flexion   96  100 103 118  123  Knee extension    -3 -3     Ankle dorsiflexion          Ankle plantarflexion          Ankle inversion          Ankle eversion           (Blank rows = not tested)   LOWER EXTREMITY MMT:   MMT (lb) Right 8/21 Left 8/21  Hip flexion      Hip extension      Hip abduction      Hip adduction      Hip internal rotation      Hip external rotation      Knee flexion  35.3 38.8   Knee extension (age norm 60lb)   46.0 46.1  Ankle dorsiflexion      Ankle plantarflexion      Ankle inversion  Ankle eversion       (Blank rows = not tested)       GAIT: Decreased left hip flexion Forward trunk flexion  Decreased left single leg                                                                                                                                    TREATMENT DATE:   8/28 Recumb bike 10 min LAQ arms across chest,  hold with core engagement Roller to hamstrings, gastroc, STM Lt lateral hip Educated on use of theracane.   8/21 Nu step 6 min Manual to right hip Discussion regarding TKA on Rt Trigger point release to gluteal and lower back using a tennis ball  Review of soft tissue mobilization of the back   There-ex:  SLR 3x10  LAQ 3x10 2lb  SAQ 3x10 2lb  LTR 2x10  Hip abduction 3x10 red    8/18 Manual:  Trigger point release to posterior knee and quad  Review of self trigger point release to the quad.  Reviewed use of her muscle roller   There-ex:  SLR 3x10  LAQ 3x10 2lb  SAQ 3x10 2 lb   There-act:  Step up 4 inch 2x10  Heel/toe raise 2x12     PATIENT EDUCATION:  Education details: Reviewed HEP, symptom management, edema control.  Person educated: Patient Education method: Explanation, Demonstration, Tactile cues, Verbal cues, and Handouts Education comprehension: verbalized understanding, returned demonstration, verbal cues required, tactile cues required, and needs further education   HOME EXERCISE PROGRAM: Access Code: [email or web address] URL: [email or web address] Date: 03/22/2024 Prepared by: Harlene Cordon  Exercises - Seated Hamstring Stretch  - 2-3 x daily - 7 x weekly - 2 sets - 5 breaths hold - Seated Heel Slide  - 5 x daily - 7 x weekly - 1 sets - 10 reps - Standing Gluteal Sets  - 5 x daily - 7 x weekly - 1 sets - 10 reps - Standing Weight Shift Side to Side Eyes Closed  - 5 x daily - 7 x weekly - 1 sets - 10 reps - Mini Squat  - 5 x daily - 7 x weekly - 1 sets - 10 reps - Sidelying Hip Abduction  - 3 x daily - 7 x weekly - 3 sets - 10 reps   ASSESSMENT:   CLINICAL IMPRESSION: Limited by tightness and discomfort in hip. Overall function has improved per LEFS answers. Encouraged her to use theracane for shoulder tightness  Eval:  Patient is a 63 y.o. female 2 weeks s/p L TKA who was seen today for physical therapy evaluation and treatment for  decreased range of motion, strength, ability to ambulate, and general functional mobility.  She who presents with expected limitations in all above deficits.  She is using the walker at this time for primary mobility outside the house.  She has expected swelling in her knee.  She would benefit from  skilled therapy to return to active lifestyle.    OBJECTIVE IMPAIRMENTS: Abnormal gait, decreased activity tolerance, decreased mobility, difficulty walking, decreased ROM, decreased strength, increased edema, increased muscle spasms, and pain.    ACTIVITY LIMITATIONS: carrying, lifting, bending, standing, squatting, stairs, transfers, bed mobility, bathing, and locomotion level   PARTICIPATION LIMITATIONS: meal prep, cleaning, laundry, driving, shopping, community activity, occupation, and yard work   PERSONAL FACTORS: 1-2 comorbidities: hip OA  are also affecting patient's functional outcome.    REHAB POTENTIAL: Good   CLINICAL DECISION MAKING: Stable/uncomplicated   EVALUATION COMPLEXITY: Low     GOALS: Goals reviewed with patient? Yes  04/13/2024     SHORT TERM GOALS: Target date:  Patient will increase passive knee flexion to 120 degrees on the left Baseline: Goal status: progressing 7/18    2.  Patient will increase passive left knee extension to full Baseline:  Goal status: INITIAL   3.  Patient will progress off assistive device and ambulate greater than 500 feet without significant antalgic gait Baseline:  Goal status: INITIAL   4.  Patient will be independent with HEP Baseline:  Goal status: INITIAL     LONG TERM GOALS: Target date: 05/11/2024     Patient will go up and down 12 steps with reciprocal gait pattern without increased pain Baseline:  Goal status: INITIAL   2.  Patient will squat pick items up off the ground without increased pain Baseline:  Goal status: INITIAL   3.  Patient will ambulate community distances without pain Baseline:  Goal status:  INITIAL   4.  Patient will have complete HEP to promote strengthening Baseline:  Goal status: INITIAL     PLAN:   PT FREQUENCY: 2x/week   PT DURATION: 8 weeks   PLANNED INTERVENTIONS: 97110-Therapeutic exercises, 97530- Therapeutic activity, [id number]- Neuromuscular re-education, 97535- Self Care, 02859- Manual therapy, [id number]- Gait training, [phone]- Aquatic Therapy, [id number]- , [id number]- Ultrasound, Patient/Family education, Stair training, Dry Needling, DME instructions, Cryotherapy, and Moist heat    PLAN FOR NEXT SESSION:   Begin with passive range of motion and edema management.  Progress home exercise program table strengthening.  Progressed to functional strengthening such as squats and steps as tolerated.   Jace Fermin C. Dixon Luczak PT, DPT 04/29/24 2:36 PM       Referring diagnosis? Left TKA  Treatment diagnosis? (if different than referring diagnosis) LEft knee stiffness  Left knee pain  What was this (referring dx) caused by? [x]  Surgery []  Fall []  Ongoing issue []  Arthritis []  Other: ____________  Laterality: []  Rt [x]  Lt []  Both  Check all possible CPT codes:  *CHOOSE 10 OR LESS*    See Planned Interventions listed in the Plan section of the Evaluation.

## 2024-05-04 ENCOUNTER — Ambulatory Visit (HOSPITAL_BASED_OUTPATIENT_CLINIC_OR_DEPARTMENT_OTHER): Admitting: Physical Therapy

## 2024-05-04 ENCOUNTER — Encounter: Payer: Self-pay | Admitting: Sports Medicine

## 2024-05-07 ENCOUNTER — Ambulatory Visit (HOSPITAL_BASED_OUTPATIENT_CLINIC_OR_DEPARTMENT_OTHER): Attending: Student | Admitting: Physical Therapy

## 2024-05-07 ENCOUNTER — Encounter (HOSPITAL_BASED_OUTPATIENT_CLINIC_OR_DEPARTMENT_OTHER): Payer: Self-pay

## 2024-05-10 ENCOUNTER — Ambulatory Visit (HOSPITAL_BASED_OUTPATIENT_CLINIC_OR_DEPARTMENT_OTHER): Attending: Student | Admitting: Physical Therapy

## 2024-05-10 DIAGNOSIS — M6281 Muscle weakness (generalized): Secondary | ICD-10-CM | POA: Diagnosis present

## 2024-05-10 DIAGNOSIS — M25562 Pain in left knee: Secondary | ICD-10-CM | POA: Diagnosis present

## 2024-05-10 DIAGNOSIS — R2689 Other abnormalities of gait and mobility: Secondary | ICD-10-CM | POA: Diagnosis present

## 2024-05-10 NOTE — Therapy (Signed)
 [facility]    OUTPATIENT PHYSICAL THERAPY LOWER EXTREMITY TREATMENT     Patient Name: Latasha [name] MRN: [id number] DOB:1961-03-09, 63 y.o., female Today's Date: 03/14/2024   END OF SESSION:              Patient Active Problem List    Diagnosis Date Noted   History of total knee arthroplasty, left 03/01/2024   Tubular adenoma 02/25/2024   Severe obesity (BMI 35.0-39.9) with comorbidity (HCC) 10/12/2023   Pulmonary nodule 08/21/2023   Hx of total hip arthroplasty, right 10/03/2021   De Quervain's tenosynovitis, right 09/06/2020   B12 deficiency 07/13/2020   Family history of colon cancer 12/01/2017   Benign essential hypertension 03/01/2016   Herpes zoster without complication 03/01/2016   BP (high blood pressure) 09/05/2015   Osteoporosis, post-menopausal 09/05/2015   Postsurgical menopause 09/05/2015   Class 1 obesity 09/05/2015   GERD (gastroesophageal reflux disease) 09/05/2015   Endometriosis 09/05/2015   Status post total hysterectomy and bilateral salpingo-oophorectomy 09/05/2015   Primary osteoarthritis of both knees 09/28/2014      PCP: Oneil Pinal MD    REFERRING PROVIDER: Fonda Koyanagi  PA-C  Hooten, Lynwood Idalia Raddle., MD (surgeon)   REFERRING DIAG: Left TKA    THERAPY DIAG:  No diagnosis found.   Rationale for Evaluation and Treatment: Rehabilitation   ONSET DATE: DOS  03/01/2024   SUBJECTIVE:    SUBJECTIVE STATEMENT: I think I slept on my shoulder funny last night, it is really sore. Hot water on it in the shower helped some.   Eval: Pt received PT from 04-02/2024 for bilat knee OA.  She received aquatic therapy  Pt underwent L TKA on 03/01/2024.  At this plan her pain is well-controlled.  She has been at home health and done well.  She did not use the cane some at home.  She practiced using the cane at home with home health.  She is using a walker outside the house.  She has an exercise program that she has been working.   Will review the exercise program today.  Her chief complaint at this time is difficulty sleeping.  She was advised that this is normal at this point.      PERTINENT HISTORY: L TKA on 6/302025 OA R knee and L shoulder. Bilat shoulder pain R THA in 2023 HTN Osteoporosis Anxiety Anemia Chronic fatigue PAIN:  Are you having pain? Yes: NPRS scale: 3-4 at worst - because of right knee Pain location: left knee  Pain description: stiff  Aggravating factors: standing and walking, sitting for too long, night time  Relieving factors: medication, Ice , rest    PRECAUTIONS: None   RED FLAGS: None           WEIGHT BEARING RESTRICTIONS: No   FALLS:  Has patient fallen in last 6 months? No   LIVING ENVIRONMENT: 5 steps into the house   OCCUPATION:  Retired    PLOF: Independent   PATIENT GOALS:  To be able to her mother son dance by October at her sons wedding/ to be able to walk      OBJECTIVE:  Note: Objective measures were completed at Evaluation unless otherwise noted.   DIAGNOSTIC FINDINGS:  Nothing post op    PATIENT SURVEYS:  LEFS  Extreme difficulty/unable (0), Quite a bit of difficulty (1), Moderate difficulty (2), Little difficulty (3), No difficulty (4) Survey date:  eval 8/28  Any of your usual work, housework or school activities  4  2. Usual hobbies, recreational or sporting activities  3  3. Getting into/out of the bath  3  4. Walking between rooms  3  5. Putting on socks/shoes  3  6. Squatting   3  7. Lifting an object, like a bag of groceries from the floor  2  8. Performing light activities around your home  1  9. Performing heavy activities around your home  2  10. Getting into/out of a car  3  11. Walking 2 blocks  1  12. Walking 1 mile  1  13. Going up/down 10 stairs (1 flight)  1  14. Standing for 1 hour  1  15.  sitting for 1 hour  1  16. Running on even ground  1  17. Running on uneven ground  1  18. Making sharp turns while running fast  1   19. Hopping   1  20. Rolling over in bed  2  Score total:  23/80 38/80      COGNITION: Overall cognitive status: Within functional limits for tasks assessed                               SENSATION:   EDEMA:  Circumferential: 44.5 R  48.7 L    MUSCLE LENGTH:   POSTURE: rounded shoulders and forward head   PALPATION: No unexpected TTP    LOWER EXTREMITY ROM:   Passive ROM Right eval Left eval left left Left  8/11  L  8/18  Hip flexion          Hip extension          Hip abduction          Hip adduction          Hip internal rotation          Hip external rotation          Knee flexion   96  100 103 118  123  Knee extension    -3 -3     Ankle dorsiflexion          Ankle plantarflexion          Ankle inversion          Ankle eversion           (Blank rows = not tested)   LOWER EXTREMITY MMT:   MMT (lb) Right 8/21 Left 8/21  Hip flexion      Hip extension      Hip abduction      Hip adduction      Hip internal rotation      Hip external rotation      Knee flexion  35.3 38.8   Knee extension (age norm 60lb)   46.0 46.1  Ankle dorsiflexion      Ankle plantarflexion      Ankle inversion      Ankle eversion       (Blank rows = not tested)       GAIT: Decreased left hip flexion Forward trunk flexion  Decreased left single leg  TREATMENT DATE:   8/28 Recumb bike 10 min LAQ arms across chest, hold with core engagement Roller to hamstrings, gastroc, STM Lt lateral hip Educated on use of theracane.   8/21 Nu step 6 min Manual to right hip Discussion regarding TKA on Rt Trigger point release to gluteal and lower back using a tennis ball  Review of soft tissue mobilization of the back   There-ex:  SLR 3x10  LAQ 3x10 2lb  SAQ 3x10 2lb  LTR 2x10  Hip abduction 3x10 red    8/18 Manual:  Trigger point release to  posterior knee and quad  Review of self trigger point release to the quad.  Reviewed use of her muscle roller   There-ex:  SLR 3x10  LAQ 3x10 2lb  SAQ 3x10 2 lb   There-act:  Step up 4 inch 2x10  Heel/toe raise 2x12     PATIENT EDUCATION:  Education details: Reviewed HEP, symptom management, edema control.  Person educated: Patient Education method: Explanation, Demonstration, Tactile cues, Verbal cues, and Handouts Education comprehension: verbalized understanding, returned demonstration, verbal cues required, tactile cues required, and needs further education   HOME EXERCISE PROGRAM: Access Code: [email or web address] URL: [email or web address] Date: 03/22/2024 Prepared by: Harlene Cordon  Exercises - Seated Hamstring Stretch  - 2-3 x daily - 7 x weekly - 2 sets - 5 breaths hold - Seated Heel Slide  - 5 x daily - 7 x weekly - 1 sets - 10 reps - Standing Gluteal Sets  - 5 x daily - 7 x weekly - 1 sets - 10 reps - Standing Weight Shift Side to Side Eyes Closed  - 5 x daily - 7 x weekly - 1 sets - 10 reps - Mini Squat  - 5 x daily - 7 x weekly - 1 sets - 10 reps - Sidelying Hip Abduction  - 3 x daily - 7 x weekly - 3 sets - 10 reps   ASSESSMENT:   CLINICAL IMPRESSION: The patients knee was tighter today, Her ROM was measured at 0-113. She admits she has not been doing her home exercises since coming home from her vacation. We encouraged her to do her exercise.  Eval:  Patient is a 63 y.o. female 2 weeks s/p L TKA who was seen today for physical therapy evaluation and treatment for decreased range of motion, strength, ability to ambulate, and general functional mobility.  She who presents with expected limitations in all above deficits.  She is using the walker at this time for primary mobility outside the house.  She has expected swelling in her knee.  She would benefit from skilled therapy to return to active lifestyle.    OBJECTIVE IMPAIRMENTS: Abnormal gait, decreased  activity tolerance, decreased mobility, difficulty walking, decreased ROM, decreased strength, increased edema, increased muscle spasms, and pain.    ACTIVITY LIMITATIONS: carrying, lifting, bending, standing, squatting, stairs, transfers, bed mobility, bathing, and locomotion level   PARTICIPATION LIMITATIONS: meal prep, cleaning, laundry, driving, shopping, community activity, occupation, and yard work   PERSONAL FACTORS: 1-2 comorbidities: hip OA  are also affecting patient's functional outcome.    REHAB POTENTIAL: Good   CLINICAL DECISION MAKING: Stable/uncomplicated   EVALUATION COMPLEXITY: Low     GOALS: Goals reviewed with patient? Yes  04/13/2024     SHORT TERM GOALS: Target date:  Patient will increase passive knee flexion to 120 degrees on the left Baseline: Goal status: progressing 7/18    2.  Patient will increase passive left knee extension to  full Baseline:  Goal status: INITIAL   3.  Patient will progress off assistive device and ambulate greater than 500 feet without significant antalgic gait Baseline:  Goal status: INITIAL   4.  Patient will be independent with HEP Baseline:  Goal status: INITIAL     LONG TERM GOALS: Target date: 05/11/2024     Patient will go up and down 12 steps with reciprocal gait pattern without increased pain Baseline:  Goal status: INITIAL   2.  Patient will squat pick items up off the ground without increased pain Baseline:  Goal status: INITIAL   3.  Patient will ambulate community distances without pain Baseline:  Goal status: INITIAL   4.  Patient will have complete HEP to promote strengthening Baseline:  Goal status: INITIAL     PLAN:   PT FREQUENCY: 2x/week   PT DURATION: 8 weeks   PLANNED INTERVENTIONS: 97110-Therapeutic exercises, 97530- Therapeutic activity, [id number]- Neuromuscular re-education, 97535- Self Care, [id number]- Manual therapy, [id number]- Gait training, [phone]- Aquatic Therapy, [id number]- , [id number]-  Ultrasound, Patient/Family education, Stair training, Dry Needling, DME instructions, Cryotherapy, and Moist heat    PLAN FOR NEXT SESSION:   Begin with passive range of motion and edema management.  Progress home exercise program table strengthening.  Progressed to functional strengthening such as squats and steps as tolerated.   Jessica C. Hightower PT, DPT 05/10/24 1:39 PM       Referring diagnosis? Left TKA  Treatment diagnosis? (if different than referring diagnosis) LEft knee stiffness  Left knee pain  What was this (referring dx) caused by? [x]  Surgery []  Fall []  Ongoing issue []  Arthritis []  Other: ____________  Laterality: []  Rt [x]  Lt []  Both  Check all possible CPT codes:  *CHOOSE 10 OR LESS*    See Planned Interventions listed in the Plan section of the Evaluation.

## 2024-05-11 ENCOUNTER — Encounter (HOSPITAL_BASED_OUTPATIENT_CLINIC_OR_DEPARTMENT_OTHER): Payer: Self-pay | Admitting: Physical Therapy

## 2024-05-13 ENCOUNTER — Ambulatory Visit (HOSPITAL_BASED_OUTPATIENT_CLINIC_OR_DEPARTMENT_OTHER): Admitting: Physical Therapy

## 2024-05-13 DIAGNOSIS — R2689 Other abnormalities of gait and mobility: Secondary | ICD-10-CM | POA: Diagnosis not present

## 2024-05-13 DIAGNOSIS — M25562 Pain in left knee: Secondary | ICD-10-CM

## 2024-05-13 DIAGNOSIS — M6281 Muscle weakness (generalized): Secondary | ICD-10-CM

## 2024-05-13 NOTE — Therapy (Signed)
 [facility]    OUTPATIENT PHYSICAL THERAPY LOWER EXTREMITY TREATMENT     Patient Name: Latasha [name] MRN: [id number] DOB:06-Jul-1961, 63 y.o., female Today's Date: 03/14/2024   END OF SESSION:               Patient Active Problem List    Diagnosis Date Noted   History of total knee arthroplasty, left 03/01/2024   Tubular adenoma 02/25/2024   Severe obesity (BMI 35.0-39.9) with comorbidity (HCC) 10/12/2023   Pulmonary nodule 08/21/2023   Hx of total hip arthroplasty, right 10/03/2021   De Quervain's tenosynovitis, right 09/06/2020   B12 deficiency 07/13/2020   Family history of colon cancer 12/01/2017   Benign essential hypertension 03/01/2016   Herpes zoster without complication 03/01/2016   BP (high blood pressure) 09/05/2015   Osteoporosis, post-menopausal 09/05/2015   Postsurgical menopause 09/05/2015   Class 1 obesity 09/05/2015   GERD (gastroesophageal reflux disease) 09/05/2015   Endometriosis 09/05/2015   Status post total hysterectomy and bilateral salpingo-oophorectomy 09/05/2015   Primary osteoarthritis of both knees 09/28/2014      PCP: Oneil Pinal MD    REFERRING PROVIDER: Fonda Koyanagi  PA-C  Hooten, Lynwood Idalia Raddle., MD (surgeon)   REFERRING DIAG: Left TKA    THERAPY DIAG:  No diagnosis found.   Rationale for Evaluation and Treatment: Rehabilitation   ONSET DATE: DOS  03/01/2024   SUBJECTIVE:    SUBJECTIVE STATEMENT: I think I slept on my shoulder funny last night, it is really sore. Hot water on it in the shower helped some.   Eval: Pt received PT from 04-02/2024 for bilat knee OA.  She received aquatic therapy  Pt underwent L TKA on 03/01/2024.  At this plan her pain is well-controlled.  She has been at home health and done well.  She did not use the cane some at home.  She practiced using the cane at home with home health.  She is using a walker outside the house.  She has an exercise program that she has been working.   Will review the exercise program today.  Her chief complaint at this time is difficulty sleeping.  She was advised that this is normal at this point.      PERTINENT HISTORY: L TKA on 6/302025 OA R knee and L shoulder. Bilat shoulder pain R THA in 2023 HTN Osteoporosis Anxiety Anemia Chronic fatigue PAIN:  Are you having pain? Yes: NPRS scale: 3-4 at worst - because of right knee Pain location: left knee  Pain description: stiff  Aggravating factors: standing and walking, sitting for too long, night time  Relieving factors: medication, Ice , rest    PRECAUTIONS: None   RED FLAGS: None           WEIGHT BEARING RESTRICTIONS: No   FALLS:  Has patient fallen in last 6 months? No   LIVING ENVIRONMENT: 5 steps into the house   OCCUPATION:  Retired    PLOF: Independent   PATIENT GOALS:  To be able to her mother son dance by October at her sons wedding/ to be able to walk      OBJECTIVE:  Note: Objective measures were completed at Evaluation unless otherwise noted.   DIAGNOSTIC FINDINGS:  Nothing post op    PATIENT SURVEYS:  LEFS  Extreme difficulty/unable (0), Quite a bit of difficulty (1), Moderate difficulty (2), Little difficulty (3), No difficulty (4) Survey date:  eval 8/28  Any of your usual work, housework or school activities  4  2. Usual hobbies, recreational or sporting activities  3  3. Getting into/out of the bath  3  4. Walking between rooms  3  5. Putting on socks/shoes  3  6. Squatting   3  7. Lifting an object, like a bag of groceries from the floor  2  8. Performing light activities around your home  1  9. Performing heavy activities around your home  2  10. Getting into/out of a car  3  11. Walking 2 blocks  1  12. Walking 1 mile  1  13. Going up/down 10 stairs (1 flight)  1  14. Standing for 1 hour  1  15.  sitting for 1 hour  1  16. Running on even ground  1  17. Running on uneven ground  1  18. Making sharp turns while running fast  1   19. Hopping   1  20. Rolling over in bed  2  Score total:  23/80 38/80      COGNITION: Overall cognitive status: Within functional limits for tasks assessed                               SENSATION:   EDEMA:  Circumferential: 44.5 R  48.7 L    MUSCLE LENGTH:   POSTURE: rounded shoulders and forward head   PALPATION: No unexpected TTP    LOWER EXTREMITY ROM:   Passive ROM Right eval Left eval left left Left  8/11  L  8/18  Hip flexion          Hip extension          Hip abduction          Hip adduction          Hip internal rotation          Hip external rotation          Knee flexion   96  100 103 118  123  Knee extension    -3 -3     Ankle dorsiflexion          Ankle plantarflexion          Ankle inversion          Ankle eversion           (Blank rows = not tested)   LOWER EXTREMITY MMT:   MMT (lb) Right 8/21 Left 8/21  Hip flexion      Hip extension      Hip abduction      Hip adduction      Hip internal rotation      Hip external rotation      Knee flexion  35.3 38.8   Knee extension (age norm 60lb)   46.0 46.1  Ankle dorsiflexion      Ankle plantarflexion      Ankle inversion      Ankle eversion       (Blank rows = not tested)       GAIT: Decreased left hip flexion Forward trunk flexion  Decreased left single leg  TREATMENT DATE:   9/11 Manual:  Trigger point release to posterior knee and quad  Review of self trigger point release to the quad.  Reviewed use of her muscle roller   There-ex:  SLR 3x10  LAQ 3x10 2lb  SAQ 3x10 2 lb   There-act:  2 inch 2x10  Side step 2x10     9/9 Manual:  Trigger point release to posterior knee and quad  Review of self trigger point release to the quad.  Reviewed use of her muscle roller   There-ex:  SLR 3x10  LAQ 3x10 2lb  SAQ 3x10 2 lb    There-ex:    Heel/toe raise 3x12  Standing march 3x12    8/28 Recumb bike 10 min LAQ arms across chest, hold with core engagement Roller to hamstrings, gastroc, STM Lt lateral hip Educated on use of theracane.   8/21 Nu step 6 min Manual to right hip Discussion regarding TKA on Rt Trigger point release to gluteal and lower back using a tennis ball  Review of soft tissue mobilization of the back   There-ex:  SLR 3x10  LAQ 3x10 2lb  SAQ 3x10 2lb  LTR 2x10  Hip abduction 3x10 red    8/18 Manual:  Trigger point release to posterior knee and quad  Review of self trigger point release to the quad.  Reviewed use of her muscle roller   There-ex:  SLR 3x10  LAQ 3x10 2lb  SAQ 3x10 2 lb   There-act:  Step up 4 inch 2x10  Heel/toe raise 2x12     PATIENT EDUCATION:  Education details: Reviewed HEP, symptom management, edema control.  Person educated: Patient Education method: Explanation, Demonstration, Tactile cues, Verbal cues, and Handouts Education comprehension: verbalized understanding, returned demonstration, verbal cues required, tactile cues required, and needs further education   HOME EXERCISE PROGRAM: Access Code: [email or web address] URL: [email or web address] Date: 03/22/2024 Prepared by: Harlene Cordon  Exercises - Seated Hamstring Stretch  - 2-3 x daily - 7 x weekly - 2 sets - 5 breaths hold - Seated Heel Slide  - 5 x daily - 7 x weekly - 1 sets - 10 reps - Standing Gluteal Sets  - 5 x daily - 7 x weekly - 1 sets - 10 reps - Standing Weight Shift Side to Side Eyes Closed  - 5 x daily - 7 x weekly - 1 sets - 10 reps - Mini Squat  - 5 x daily - 7 x weekly - 1 sets - 10 reps - Sidelying Hip Abduction  - 3 x daily - 7 x weekly - 3 sets - 10 reps   ASSESSMENT:   CLINICAL IMPRESSION: The patients knee was tighter today, Her ROM was measured at 0-113. She admits she has not been doing her home exercises since coming home from her vacation. We encouraged her to do  her exercise. She had more sciatic pain today so we avoided steps and squats 2nd to increase pain weight bearing. W performed manual therapy to reduce sciatic symptoms. She reported improvement.   Eval:  Patient is a 63 y.o. female 2 weeks s/p L TKA who was seen today for physical therapy evaluation and treatment for decreased range of motion, strength, ability to ambulate, and general functional mobility.  She who presents with expected limitations in all above deficits.  She is using the walker at this time for primary mobility outside the house.  She has expected swelling in her knee.  She would benefit from skilled therapy to  return to active lifestyle.    OBJECTIVE IMPAIRMENTS: Abnormal gait, decreased activity tolerance, decreased mobility, difficulty walking, decreased ROM, decreased strength, increased edema, increased muscle spasms, and pain.    ACTIVITY LIMITATIONS: carrying, lifting, bending, standing, squatting, stairs, transfers, bed mobility, bathing, and locomotion level   PARTICIPATION LIMITATIONS: meal prep, cleaning, laundry, driving, shopping, community activity, occupation, and yard work   PERSONAL FACTORS: 1-2 comorbidities: hip OA  are also affecting patient's functional outcome.    REHAB POTENTIAL: Good   CLINICAL DECISION MAKING: Stable/uncomplicated   EVALUATION COMPLEXITY: Low     GOALS: Goals reviewed with patient? Yes  04/13/2024     SHORT TERM GOALS: Target date:  Patient will increase passive knee flexion to 120 degrees on the left Baseline: Goal status: progressing 7/18    2.  Patient will increase passive left knee extension to full Baseline:  Goal status: INITIAL   3.  Patient will progress off assistive device and ambulate greater than 500 feet without significant antalgic gait Baseline:  Goal status: INITIAL   4.  Patient will be independent with HEP Baseline:  Goal status: INITIAL     LONG TERM GOALS: Target date: 05/11/2024      Patient will go up and down 12 steps with reciprocal gait pattern without increased pain Baseline:  Goal status: INITIAL   2.  Patient will squat pick items up off the ground without increased pain Baseline:  Goal status: INITIAL   3.  Patient will ambulate community distances without pain Baseline:  Goal status: INITIAL   4.  Patient will have complete HEP to promote strengthening Baseline:  Goal status: INITIAL     PLAN:   PT FREQUENCY: 2x/week   PT DURATION: 8 weeks   PLANNED INTERVENTIONS: 97110-Therapeutic exercises, 97530- Therapeutic activity, [id number]- Neuromuscular re-education, 97535- Self Care, [id number]- Manual therapy, [id number]- Gait training, [phone]- Aquatic Therapy, [id number]- , [id number]- Ultrasound, Patient/Family education, Stair training, Dry Needling, DME instructions, Cryotherapy, and Moist heat    PLAN FOR NEXT SESSION:   Begin with passive range of motion and edema management.  Progress home exercise program table strengthening.  Progressed to functional strengthening such as squats and steps as tolerated.   Jessica C. Hightower PT, DPT 05/13/24 1:35 PM       Referring diagnosis? Left TKA  Treatment diagnosis? (if different than referring diagnosis) LEft knee stiffness  Left knee pain  What was this (referring dx) caused by? [x]  Surgery []  Fall []  Ongoing issue []  Arthritis []  Other: ____________  Laterality: []  Rt [x]  Lt []  Both  Check all possible CPT codes:  *CHOOSE 10 OR LESS*    See Planned Interventions listed in the Plan section of the Evaluation.

## 2024-05-14 ENCOUNTER — Encounter (HOSPITAL_BASED_OUTPATIENT_CLINIC_OR_DEPARTMENT_OTHER): Payer: Self-pay | Admitting: Physical Therapy

## 2024-05-18 ENCOUNTER — Encounter (HOSPITAL_BASED_OUTPATIENT_CLINIC_OR_DEPARTMENT_OTHER): Payer: Self-pay | Admitting: Physical Therapy

## 2024-05-18 ENCOUNTER — Ambulatory Visit (HOSPITAL_BASED_OUTPATIENT_CLINIC_OR_DEPARTMENT_OTHER): Admitting: Physical Therapy

## 2024-05-18 DIAGNOSIS — R2689 Other abnormalities of gait and mobility: Secondary | ICD-10-CM

## 2024-05-18 DIAGNOSIS — M25562 Pain in left knee: Secondary | ICD-10-CM

## 2024-05-18 DIAGNOSIS — M6281 Muscle weakness (generalized): Secondary | ICD-10-CM

## 2024-05-18 NOTE — Therapy (Signed)
 [facility]    OUTPATIENT PHYSICAL THERAPY LOWER EXTREMITY TREATMENT     Patient Name: Latasha [name] MRN: [id number] DOB:1960/11/02, 63 y.o., female Today's Date: 03/14/2024   END OF SESSION:    PT End of Session - 05/18/24 1546     Visit Number 17    Number of Visits 32    Date for PT Re-Evaluation 07/09/24    Authorization Type [employer] m[employer]    Authorization Time Period 05/17/24-07/09/24    Authorization - Visit Number 1    Authorization - Number of Visits 16    PT Start Time 1535    PT Stop Time 1613    PT Time Calculation (min) 38 min    Activity Tolerance Patient tolerated treatment well    Behavior During Therapy [facility] for tasks assessed/performed                    Patient Active Problem List    Diagnosis Date Noted   History of total knee arthroplasty, left 03/01/2024   Tubular adenoma 02/25/2024   Severe obesity (BMI 35.0-39.9) with comorbidity (HCC) 10/12/2023   Pulmonary nodule 08/21/2023   Hx of total hip arthroplasty, right 10/03/2021   De Quervain's tenosynovitis, right 09/06/2020   B12 deficiency 07/13/2020   Family history of colon cancer 12/01/2017   Benign essential hypertension 03/01/2016   Herpes zoster without complication 03/01/2016   BP (high blood pressure) 09/05/2015   Osteoporosis, post-menopausal 09/05/2015   Postsurgical menopause 09/05/2015   Class 1 obesity 09/05/2015   GERD (gastroesophageal reflux disease) 09/05/2015   Endometriosis 09/05/2015   Status post total hysterectomy and bilateral salpingo-oophorectomy 09/05/2015   Primary osteoarthritis of both knees 09/28/2014   Progress Note Reporting Period 04/12/2024 to 05/14/2024  See note below for Objective Data and Assessment of Progress/Goals.          PCP: Oneil Pinal MD    REFERRING PROVIDER: Fonda Koyanagi  PA-C  Hooten, Lynwood Idalia Raddle., MD (surgeon)   REFERRING DIAG: Left TKA    THERAPY DIAG:  No diagnosis found.   Rationale for Evaluation  and Treatment: Rehabilitation   ONSET DATE: DOS  03/01/2024   SUBJECTIVE:    SUBJECTIVE STATEMENT: I think I slept on my shoulder funny last night, it is really sore. Hot water on it in the shower helped some.   Eval: Pt received PT from 04-02/2024 for bilat knee OA.  She received aquatic therapy  Pt underwent L TKA on 03/01/2024.  At this plan her pain is well-controlled.  She has been at home health and done well.  She did not use the cane some at home.  She practiced using the cane at home with home health.  She is using a walker outside the house.  She has an exercise program that she has been working.  Will review the exercise program today.  Her chief complaint at this time is difficulty sleeping.  She was advised that this is normal at this point.      PERTINENT HISTORY: L TKA on 6/302025 OA R knee and L shoulder. Bilat shoulder pain R THA in 2023 HTN Osteoporosis Anxiety Anemia Chronic fatigue PAIN:  Are you having pain? Yes: NPRS scale: 3-4 at worst - because of right knee Pain location: left knee  Pain description: stiff  Aggravating factors: standing and walking, sitting for too long, night time  Relieving factors: medication, Ice , rest    PRECAUTIONS: None   RED FLAGS: None  WEIGHT BEARING RESTRICTIONS: No   FALLS:  Has patient fallen in last 6 months? No   LIVING ENVIRONMENT: 5 steps into the house   OCCUPATION:  Retired    PLOF: Independent   PATIENT GOALS:  To be able to her mother son dance by October at her sons wedding/ to be able to walk      OBJECTIVE:  Note: Objective measures were completed at Evaluation unless otherwise noted.   DIAGNOSTIC FINDINGS:  Nothing post op    PATIENT SURVEYS:  LEFS  Extreme difficulty/unable (0), Quite a bit of difficulty (1), Moderate difficulty (2), Little difficulty (3), No difficulty (4) Survey date:  eval 8/28  Any of your usual work, housework or school activities  4  2. Usual hobbies,  recreational or sporting activities  3  3. Getting into/out of the bath  3  4. Walking between rooms  3  5. Putting on socks/shoes  3  6. Squatting   3  7. Lifting an object, like a bag of groceries from the floor  2  8. Performing light activities around your home  1  9. Performing heavy activities around your home  2  10. Getting into/out of a car  3  11. Walking 2 blocks  1  12. Walking 1 mile  1  13. Going up/down 10 stairs (1 flight)  1  14. Standing for 1 hour  1  15.  sitting for 1 hour  1  16. Running on even ground  1  17. Running on uneven ground  1  18. Making sharp turns while running fast  1  19. Hopping   1  20. Rolling over in bed  2  Score total:  23/80 38/80      COGNITION: Overall cognitive status: Within functional limits for tasks assessed                               SENSATION:   EDEMA:  Circumferential: 44.5 R  48.7 L    MUSCLE LENGTH:   POSTURE: rounded shoulders and forward head   PALPATION: No unexpected TTP    LOWER EXTREMITY ROM:   Passive ROM Right eval Left eval left left Left  8/11  L  8/18  Hip flexion          Hip extension          Hip abduction          Hip adduction          Hip internal rotation          Hip external rotation          Knee flexion   96  100 103 118  123  Knee extension    -3 -3     Ankle dorsiflexion          Ankle plantarflexion          Ankle inversion          Ankle eversion           (Blank rows = not tested)   LOWER EXTREMITY MMT:   MMT (lb) Right 8/21 Left 8/21 Right  Left   Hip flexion     4 4+  Hip extension        Hip abduction     4 4+  Hip adduction        Hip internal rotation  Hip external rotation        Knee flexion  35.3 38.8     Knee extension (age norm 60lb)   46.0 46.1 46.0 ( from previous visit)  46.1 ( from previous visit)   Ankle dorsiflexion        Ankle plantarflexion        Ankle inversion        Ankle eversion         (Blank rows = not tested)        GAIT: Decreased left hip flexion Forward trunk flexion  Decreased left single leg                                                                                                                                    TREATMENT DATE:   05/18/24 [facility] Adult PT Treatment:                                             Date:  Pt seen for aquatic therapy today.  Treatment took place in water 3.5-4.75 ft in depth at the [facility] pool. Temp of water was 91.  Pt entered/exited the pool via stairs independently with bil rail.  - unsupported walking forward/ backward - unsupported side stepping  - marching in place-> marching backward and forward walking kick  - UE On yellow hand floats: 3 way straight leg kick x 10 each LE; heel raises x 10 - squats pushing single yellow hand float under water 2x10 - Lt runners step ups x 10 with light UE support on rails - Lt lateral step ups x 10 with light RUE on wall  - straddling noodle without UE support: cycling  - Lt hamstring stretch with heel on 2nd step x 20sec - when dried off: applied reg Rock tape in zigzag pattern to Lt knee incision to assist with scar mobilization and desensitization. Pt instructed in safe tape removal techniques; verbalized understanding.   Pt requires the buoyancy and hydrostatic pressure of water for support, and to offload joints by unweighting joint load by at least 50 % in navel deep water and by at least 75-80% in chest to neck deep water.  Viscosity of the water is needed for resistance of strengthening. Water current perturbations provides challenge to standing balance requiring increased core activation.     9/11 Manual:  Trigger point release to posterior knee and quad  Review of self trigger point release to the quad.  Reviewed use of her muscle roller   There-ex:  SLR 3x10  LAQ 3x10 2lb  SAQ 3x10 2 lb   There-act:  2 inch 2x10  Side step 2x10   Review of ROM and strength measurements      9/9 Manual:  Trigger point release to posterior knee and quad  Review of self trigger point release to the quad.  Reviewed use of her muscle roller   There-ex:  SLR 3x10  LAQ 3x10 2lb  SAQ 3x10 2 lb    There-ex:   Heel/toe raise 3x12  Standing march 3x12    8/28 Recumb bike 10 min LAQ arms across chest, hold with core engagement Roller to hamstrings, gastroc, STM Lt lateral hip Educated on use of theracane.   8/21 Nu step 6 min Manual to right hip Discussion regarding TKA on Rt Trigger point release to gluteal and lower back using a tennis ball  Review of soft tissue mobilization of the back   There-ex:  SLR 3x10  LAQ 3x10 2lb  SAQ 3x10 2lb  LTR 2x10  Hip abduction 3x10 red    8/18 Manual:  Trigger point release to posterior knee and quad  Review of self trigger point release to the quad.  Reviewed use of her muscle roller   There-ex:  SLR 3x10  LAQ 3x10 2lb  SAQ 3x10 2 lb   There-act:  Step up 4 inch 2x10  Heel/toe raise 2x12     PATIENT EDUCATION:  Education details: Reviewed HEP, symptom management, edema control.  Person educated: Patient Education method: Explanation, Demonstration, Tactile cues, Verbal cues, and Handouts Education comprehension: verbalized understanding, returned demonstration, verbal cues required, tactile cues required, and needs further education   HOME EXERCISE PROGRAM: Access Code: [email or web address] URL: [email or web address] Date: 03/22/2024 Prepared by: Harlene Cordon  Exercises - Seated Hamstring Stretch  - 2-3 x daily - 7 x weekly - 2 sets - 5 breaths hold - Seated Heel Slide  - 5 x daily - 7 x weekly - 1 sets - 10 reps - Standing Gluteal Sets  - 5 x daily - 7 x weekly - 1 sets - 10 reps - Standing Weight Shift Side to Side Eyes Closed  - 5 x daily - 7 x weekly - 1 sets - 10 reps - Mini Squat  - 5 x daily - 7 x weekly - 1 sets - 10 reps - Sidelying Hip Abduction  - 3 x daily - 7 x weekly - 3 sets -  10 reps   ASSESSMENT:   CLINICAL IMPRESSION: Pt reacquainted with aquatic therapy setting and exercises.  Pt tolerated all exercises well, without increase in pain. Trial of rock tape to Lt ant knee to assist with desensitizing and scar mobilization. Will continue to progress as tolerated.   Eval:  Patient is a 63 y.o. female 2 weeks s/p L TKA who was seen today for physical therapy evaluation and treatment for decreased range of motion, strength, ability to ambulate, and general functional mobility.  She who presents with expected limitations in all above deficits.  She is using the walker at this time for primary mobility outside the house.  She has expected swelling in her knee.  She would benefit from skilled therapy to return to active lifestyle.    OBJECTIVE IMPAIRMENTS: Abnormal gait, decreased activity tolerance, decreased mobility, difficulty walking, decreased ROM, decreased strength, increased edema, increased muscle spasms, and pain.    ACTIVITY LIMITATIONS: carrying, lifting, bending, standing, squatting, stairs, transfers, bed mobility, bathing, and locomotion level   PARTICIPATION LIMITATIONS: meal prep, cleaning, laundry, driving, shopping, community activity, occupation, and yard work   PERSONAL FACTORS: 1-2 comorbidities: hip OA  are also affecting patient's functional outcome.    REHAB POTENTIAL: Good   CLINICAL DECISION MAKING: Stable/uncomplicated   EVALUATION COMPLEXITY: Low     GOALS:  Goals reviewed with patient? Yes  04/13/2024     SHORT TERM GOALS: Target date:  Patient will increase passive knee flexion to 128 degrees on the left Baseline: Goal status: Achieved 9/11 goal updated to 128   2.  Patient will increase passive left knee extension to full Baseline:  Goal status: Achieved 9/11 3.  Patient will progress off assistive device and ambulate greater than 500 feet without significant antalgic gait Baseline:  Goal status: Still using cane.  Patient  reports she does not feel safe off the cane yet 9/11   4.  Patient will be independent with HEP Baseline:  Goal status: Has initial HEP achieved 9/11     LONG TERM GOALS: Target date: 05/11/2024     Patient will go up and down 12 steps with reciprocal gait pattern without increased pain Baseline:  Goal status: Begin cane training 9/11   2.  Patient will squat pick items up off the ground without increased pain Baseline:  Goal status: Working on squats but not achieved 9/11   3.  Patient will ambulate community distances without pain Baseline:  Goal status: Still using cane and limited distances 9/11   4.  Patient will have complete HEP to promote strengthening Baseline:  Goal status: Continue to progress towards end of HEP 9/11     PLAN:   PT FREQUENCY: 2x/week   PT DURATION: 8 weeks   PLANNED INTERVENTIONS: 97110-Therapeutic exercises, 97530- Therapeutic activity, 97112- Neuromuscular re-education, 97535- Self Care, [id number]- Manual therapy, [id number]- Gait training, [phone]- Aquatic Therapy, [id number]- , [id number]- Ultrasound, Patient/Family education, Stair training, Dry Needling, DME instructions, Cryotherapy, and Moist heat    PLAN FOR NEXT SESSION:  Progressed to functional strengthening such as squats and steps as tolerated.       Referring diagnosis? Left TKA  Treatment diagnosis? (if different than referring diagnosis) LEft knee stiffness  Left knee pain  What was this (referring dx) caused by? [x]  Surgery []  Fall []  Ongoing issue []  Arthritis []  Other: ____________  Laterality: []  Rt [x]  Lt []  Both  Check all possible CPT codes:  *CHOOSE 10 OR LESS*    See Planned Interventions listed in the Plan section of the Evaluation.    Delon Aquas, PTA 05/18/24 5:19 PM [facility] [address] [place], [place], [address] Phone: [phone]   Fax:  [phone]

## 2024-05-20 ENCOUNTER — Encounter (HOSPITAL_BASED_OUTPATIENT_CLINIC_OR_DEPARTMENT_OTHER): Payer: Self-pay | Admitting: Physical Therapy

## 2024-05-20 ENCOUNTER — Ambulatory Visit (HOSPITAL_BASED_OUTPATIENT_CLINIC_OR_DEPARTMENT_OTHER): Admitting: Physical Therapy

## 2024-05-20 DIAGNOSIS — R2689 Other abnormalities of gait and mobility: Secondary | ICD-10-CM

## 2024-05-20 DIAGNOSIS — M6281 Muscle weakness (generalized): Secondary | ICD-10-CM

## 2024-05-20 DIAGNOSIS — M25562 Pain in left knee: Secondary | ICD-10-CM

## 2024-05-20 NOTE — Therapy (Signed)
 [facility]    OUTPATIENT PHYSICAL THERAPY LOWER EXTREMITY TREATMENT     Patient Name: Latasha [name] MRN: [id number] DOB:1960/12/24, 63 y.o., female Today's Date: 03/14/2024   END OF SESSION:    PT End of Session - 05/20/24 1159     Visit Number 18    Number of Visits 32    Date for Recertification  07/09/24    Authorization Type [employer] m[employer]    Authorization Time Period 05/17/24-07/09/24    Authorization - Visit Number 2    Authorization - Number of Visits 16    PT Start Time 1155    PT Stop Time 1230    PT Time Calculation (min) 35 min    Activity Tolerance Patient tolerated treatment well    Behavior During Therapy [facility] for tasks assessed/performed                    Patient Active Problem List    Diagnosis Date Noted   History of total knee arthroplasty, left 03/01/2024   Tubular adenoma 02/25/2024   Severe obesity (BMI 35.0-39.9) with comorbidity (HCC) 10/12/2023   Pulmonary nodule 08/21/2023   Hx of total hip arthroplasty, right 10/03/2021   De Quervain's tenosynovitis, right 09/06/2020   B12 deficiency 07/13/2020   Family history of colon cancer 12/01/2017   Benign essential hypertension 03/01/2016   Herpes zoster without complication 03/01/2016   BP (high blood pressure) 09/05/2015   Osteoporosis, post-menopausal 09/05/2015   Postsurgical menopause 09/05/2015   Class 1 obesity 09/05/2015   GERD (gastroesophageal reflux disease) 09/05/2015   Endometriosis 09/05/2015   Status post total hysterectomy and bilateral salpingo-oophorectomy 09/05/2015   Primary osteoarthritis of both knees 09/28/2014   Progress Note Reporting Period 04/12/2024 to 05/14/2024  See note below for Objective Data and Assessment of Progress/Goals.          PCP: Oneil Pinal MD    REFERRING PROVIDER: Fonda Koyanagi  PA-C  Hooten, Lynwood Idalia Raddle., MD (surgeon)   REFERRING DIAG: Left TKA    THERAPY DIAG:  No diagnosis found.   Rationale for Evaluation  and Treatment: Rehabilitation   ONSET DATE: DOS  03/01/2024   SUBJECTIVE:    SUBJECTIVE STATEMENT: Pool felt really good and less restrictive. Lt sciatic nerve is really bothering me on and off still.   Eval: Pt received PT from 04-02/2024 for bilat knee OA.  She received aquatic therapy  Pt underwent L TKA on 03/01/2024.  At this plan her pain is well-controlled.  She has been at home health and done well.  She did not use the cane some at home.  She practiced using the cane at home with home health.  She is using a walker outside the house.  She has an exercise program that she has been working.  Will review the exercise program today.  Her chief complaint at this time is difficulty sleeping.  She was advised that this is normal at this point.      PERTINENT HISTORY: L TKA on 6/302025 OA R knee and L shoulder. Bilat shoulder pain R THA in 2023 HTN Osteoporosis Anxiety Anemia Chronic fatigue PAIN:  Are you having pain? Yes: NPRS scale: 3-4 at worst - because of right knee Pain location: left knee  Pain description: stiff  Aggravating factors: standing and walking, sitting for too long, night time  Relieving factors: medication, Ice , rest    PRECAUTIONS: None   RED FLAGS: None  WEIGHT BEARING RESTRICTIONS: No   FALLS:  Has patient fallen in last 6 months? No   LIVING ENVIRONMENT: 5 steps into the house   OCCUPATION:  Retired    PLOF: Independent   PATIENT GOALS:  To be able to her mother son dance by October at her sons wedding/ to be able to walk      OBJECTIVE:  Note: Objective measures were completed at Evaluation unless otherwise noted.   DIAGNOSTIC FINDINGS:  Nothing post op    PATIENT SURVEYS:  LEFS  Extreme difficulty/unable (0), Quite a bit of difficulty (1), Moderate difficulty (2), Little difficulty (3), No difficulty (4) Survey date:  eval 8/28  Any of your usual work, housework or school activities  4  2. Usual hobbies, recreational  or sporting activities  3  3. Getting into/out of the bath  3  4. Walking between rooms  3  5. Putting on socks/shoes  3  6. Squatting   3  7. Lifting an object, like a bag of groceries from the floor  2  8. Performing light activities around your home  1  9. Performing heavy activities around your home  2  10. Getting into/out of a car  3  11. Walking 2 blocks  1  12. Walking 1 mile  1  13. Going up/down 10 stairs (1 flight)  1  14. Standing for 1 hour  1  15.  sitting for 1 hour  1  16. Running on even ground  1  17. Running on uneven ground  1  18. Making sharp turns while running fast  1  19. Hopping   1  20. Rolling over in bed  2  Score total:  23/80 38/80      COGNITION: Overall cognitive status: Within functional limits for tasks assessed                               SENSATION:   EDEMA:  Circumferential: 44.5 R  48.7 L    MUSCLE LENGTH:   POSTURE: rounded shoulders and forward head   PALPATION: No unexpected TTP    LOWER EXTREMITY ROM:   Passive ROM Right eval Left eval left left Left  8/11  L  8/18  Hip flexion          Hip extension          Hip abduction          Hip adduction          Hip internal rotation          Hip external rotation          Knee flexion   96  100 103 118  123  Knee extension    -3 -3     Ankle dorsiflexion          Ankle plantarflexion          Ankle inversion          Ankle eversion           (Blank rows = not tested)   LOWER EXTREMITY MMT:   MMT (lb) Right 8/21 Left 8/21 Right  Left   Hip flexion     4 4+  Hip extension        Hip abduction     4 4+  Hip adduction        Hip internal rotation  Hip external rotation        Knee flexion  35.3 38.8     Knee extension (age norm 60lb)   46.0 46.1 46.0 ( from previous visit)  46.1 ( from previous visit)   Ankle dorsiflexion        Ankle plantarflexion        Ankle inversion        Ankle eversion         (Blank rows = not tested)        GAIT: Decreased left hip flexion Forward trunk flexion  Decreased left single leg                                                                                                                                    TREATMENT DATE:   9/18 Bike 5 min  Standing squat back from bar Seated pelvis twisting + forward hinge for Lt hip external rotator stretch Removed Lt flip flop- Rt hip hike Gait with Rt hip hike   05/18/24 [facility] Adult PT Treatment:                                             Date:  Pt seen for aquatic therapy today.  Treatment took place in water 3.5-4.75 ft in depth at the [facility] pool. Temp of water was 91.  Pt entered/exited the pool via stairs independently with bil rail.  - unsupported walking forward/ backward - unsupported side stepping  - marching in place-> marching backward and forward walking kick  - UE On yellow hand floats: 3 way straight leg kick x 10 each LE; heel raises x 10 - squats pushing single yellow hand float under water 2x10 - Lt runners step ups x 10 with light UE support on rails - Lt lateral step ups x 10 with light RUE on wall  - straddling noodle without UE support: cycling  - Lt hamstring stretch with heel on 2nd step x 20sec - when dried off: applied reg Rock tape in zigzag pattern to Lt knee incision to assist with scar mobilization and desensitization. Pt instructed in safe tape removal techniques; verbalized understanding.   Pt requires the buoyancy and hydrostatic pressure of water for support, and to offload joints by unweighting joint load by at least 50 % in navel deep water and by at least 75-80% in chest to neck deep water.  Viscosity of the water is needed for resistance of strengthening. Water current perturbations provides challenge to standing balance requiring increased core activation.     9/11 Manual:  Trigger point release to posterior knee and quad  Review of self trigger point release to the quad.   Reviewed use of her muscle roller   There-ex:  SLR 3x10  LAQ 3x10 2lb  SAQ 3x10 2  lb   There-act:  2 inch 2x10  Side step 2x10   Review of ROM and strength measurements     9/9 Manual:  Trigger point release to posterior knee and quad  Review of self trigger point release to the quad.  Reviewed use of her muscle roller   There-ex:  SLR 3x10  LAQ 3x10 2lb  SAQ 3x10 2 lb    There-ex:   Heel/toe raise 3x12  Standing march 3x12    8/28 Recumb bike 10 min LAQ arms across chest, hold with core engagement Roller to hamstrings, gastroc, STM Lt lateral hip Educated on use of theracane.   8/21 Nu step 6 min Manual to right hip Discussion regarding TKA on Rt Trigger point release to gluteal and lower back using a tennis ball  Review of soft tissue mobilization of the back   There-ex:  SLR 3x10  LAQ 3x10 2lb  SAQ 3x10 2lb  LTR 2x10  Hip abduction 3x10 red    8/18 Manual:  Trigger point release to posterior knee and quad  Review of self trigger point release to the quad.  Reviewed use of her muscle roller   There-ex:  SLR 3x10  LAQ 3x10 2lb  SAQ 3x10 2 lb   There-act:  Step up 4 inch 2x10  Heel/toe raise 2x12     PATIENT EDUCATION:  Education details: Reviewed HEP, symptom management, edema control.  Person educated: Patient Education method: Explanation, Demonstration, Tactile cues, Verbal cues, and Handouts Education comprehension: verbalized understanding, returned demonstration, verbal cues required, tactile cues required, and needs further education   HOME EXERCISE PROGRAM: Access Code: [email or web address] URL: [email or web address] Date: 03/22/2024 Prepared by: Harlene Cordon  Exercises - Seated Hamstring Stretch  - 2-3 x daily - 7 x weekly - 2 sets - 5 breaths hold - Seated Heel Slide  - 5 x daily - 7 x weekly - 1 sets - 10 reps - Standing Gluteal Sets  - 5 x daily - 7 x weekly - 1 sets - 10 reps - Standing Weight Shift Side to  Side Eyes Closed  - 5 x daily - 7 x weekly - 1 sets - 10 reps - Mini Squat  - 5 x daily - 7 x weekly - 1 sets - 10 reps - Sidelying Hip Abduction  - 3 x daily - 7 x weekly - 3 sets - 10 reps   ASSESSMENT:   CLINICAL IMPRESSION: LLD resulting from resolution of Lt valgus and pelvis is not level- resulting in deep hip external rotator tightness and sciatic-like symptoms. Pt will insert heel lift to right shoe when she is wearing a closed shoe and continue hip hike in ambulation. Will look into [facility] with pool and consider if PREP program is appropriate for her.   Eval:  Patient is a 63 y.o. female 2 weeks s/p L TKA who was seen today for physical therapy evaluation and treatment for decreased range of motion, strength, ability to ambulate, and general functional mobility.  She who presents with expected limitations in all above deficits.  She is using the walker at this time for primary mobility outside the house.  She has expected swelling in her knee.  She would benefit from skilled therapy to return to active lifestyle.    OBJECTIVE IMPAIRMENTS: Abnormal gait, decreased activity tolerance, decreased mobility, difficulty walking, decreased ROM, decreased strength, increased edema, increased muscle spasms, and pain.    ACTIVITY LIMITATIONS: carrying, lifting, bending, standing, squatting, stairs, transfers, bed mobility, bathing, and  locomotion level   PARTICIPATION LIMITATIONS: meal prep, cleaning, laundry, driving, shopping, community activity, occupation, and yard work   PERSONAL FACTORS: 1-2 comorbidities: hip OA  are also affecting patient's functional outcome.    REHAB POTENTIAL: Good   CLINICAL DECISION MAKING: Stable/uncomplicated   EVALUATION COMPLEXITY: Low     GOALS: Goals reviewed with patient? Yes  04/13/2024     SHORT TERM GOALS: Target date:  Patient will increase passive knee flexion to 128 degrees on the left Baseline: Goal status: Achieved 9/11 goal updated to  128   2.  Patient will increase passive left knee extension to full Baseline:  Goal status: Achieved 9/11 3.  Patient will progress off assistive device and ambulate greater than 500 feet without significant antalgic gait Baseline:  Goal status: Still using cane.  Patient reports she does not feel safe off the cane yet 9/11   4.  Patient will be independent with HEP Baseline:  Goal status: Has initial HEP achieved 9/11     LONG TERM GOALS: Target date: 05/11/2024     Patient will go up and down 12 steps with reciprocal gait pattern without increased pain Baseline:  Goal status: Begin cane training 9/11   2.  Patient will squat pick items up off the ground without increased pain Baseline:  Goal status: Working on squats but not achieved 9/11   3.  Patient will ambulate community distances without pain Baseline:  Goal status: Still using cane and limited distances 9/11   4.  Patient will have complete HEP to promote strengthening Baseline:  Goal status: Continue to progress towards end of HEP 9/11     PLAN:   PT FREQUENCY: 2x/week   PT DURATION: 8 weeks   PLANNED INTERVENTIONS: 97110-Therapeutic exercises, 97530- Therapeutic activity, 97112- Neuromuscular re-education, 97535- Self Care, [id number]- Manual therapy, [id number]- Gait training, [phone]- Aquatic Therapy, [id number]- , [id number]- Ultrasound, Patient/Family education, Stair training, Dry Needling, DME instructions, Cryotherapy, and Moist heat    PLAN FOR NEXT SESSION:  Progressed to functional strengthening such as squats and steps as tolerated.       Referring diagnosis? Left TKA  Treatment diagnosis? (if different than referring diagnosis) LEft knee stiffness  Left knee pain  What was this (referring dx) caused by? [x]  Surgery []  Fall []  Ongoing issue []  Arthritis []  Other: ____________  Laterality: []  Rt [x]  Lt []  Both  Check all possible CPT codes:  *CHOOSE 10 OR LESS*    See Planned Interventions listed in the  Plan section of the Evaluation.    Delon Aquas, PTA 05/20/24 12:42 PM [facility] [address] [place], [place], [address] Phone: [phone]   Fax:  [phone]

## 2024-05-24 ENCOUNTER — Ambulatory Visit
Admission: RE | Admit: 2024-05-24 | Discharge: 2024-05-24 | Disposition: A | Discharge status: Home / Self Care | Source: Ambulatory Visit | Attending: Internal Medicine | Admitting: Internal Medicine

## 2024-05-24 DIAGNOSIS — Z1231 Encounter for screening mammogram for malignant neoplasm of breast: Secondary | ICD-10-CM | POA: Diagnosis present

## 2024-05-25 ENCOUNTER — Ambulatory Visit (HOSPITAL_BASED_OUTPATIENT_CLINIC_OR_DEPARTMENT_OTHER): Admitting: Physical Therapy

## 2024-05-26 ENCOUNTER — Ambulatory Visit (HOSPITAL_BASED_OUTPATIENT_CLINIC_OR_DEPARTMENT_OTHER): Admitting: Physical Therapy

## 2024-05-26 ENCOUNTER — Encounter (HOSPITAL_BASED_OUTPATIENT_CLINIC_OR_DEPARTMENT_OTHER): Payer: Self-pay | Admitting: Physical Therapy

## 2024-05-26 DIAGNOSIS — R2689 Other abnormalities of gait and mobility: Secondary | ICD-10-CM | POA: Diagnosis not present

## 2024-05-26 DIAGNOSIS — M6281 Muscle weakness (generalized): Secondary | ICD-10-CM

## 2024-05-26 DIAGNOSIS — M25562 Pain in left knee: Secondary | ICD-10-CM

## 2024-05-26 NOTE — Therapy (Signed)
 [facility]    OUTPATIENT PHYSICAL THERAPY LOWER EXTREMITY TREATMENT     Patient Name: Nechama Escutia MRN: [id number] DOB:May 18, 1961, 63 y.o., female Today's Date: 03/14/2024   END OF SESSION:    PT End of Session - 05/26/24 1146     Visit Number 19    Number of Visits 32    Date for Recertification  07/09/24    Authorization Type [employer] m[employer]    Authorization Time Period 05/17/24-07/09/24    Authorization - Visit Number 3    Authorization - Number of Visits 16    PT Start Time 1146    PT Stop Time 1226    PT Time Calculation (min) 40 min                    Patient Active Problem List    Diagnosis Date Noted   History of total knee arthroplasty, left 03/01/2024   Tubular adenoma 02/25/2024   Severe obesity (BMI 35.0-39.9) with comorbidity (HCC) 10/12/2023   Pulmonary nodule 08/21/2023   Hx of total hip arthroplasty, right 10/03/2021   De Quervain's tenosynovitis, right 09/06/2020   B12 deficiency 07/13/2020   Family history of colon cancer 12/01/2017   Benign essential hypertension 03/01/2016   Herpes zoster without complication 03/01/2016   BP (high blood pressure) 09/05/2015   Osteoporosis, post-menopausal 09/05/2015   Postsurgical menopause 09/05/2015   Class 1 obesity 09/05/2015   GERD (gastroesophageal reflux disease) 09/05/2015   Endometriosis 09/05/2015   Status post total hysterectomy and bilateral salpingo-oophorectomy 09/05/2015   Primary osteoarthritis of both knees 09/28/2014    PCP: Oneil Pinal MD    REFERRING PROVIDER: Fonda Koyanagi  PA-C  Hooten, Lynwood Idalia Raddle., MD (surgeon)   REFERRING DIAG: Left TKA    THERAPY DIAG:  No diagnosis found.   Rationale for Evaluation and Treatment: Rehabilitation   ONSET DATE: DOS  03/01/2024   SUBJECTIVE:    SUBJECTIVE STATEMENT: The last three days have been awful with my (Lt) sciatica.  Pt reports continued difficulty with squats at home. She states that her husband would  like her to have her Rt knee replaced before the end of the year.     Eval: Pt received PT from 04-02/2024 for bilat knee OA.  She received aquatic therapy  Pt underwent L TKA on 03/01/2024.  At this plan her pain is well-controlled.  She has been at home health and done well.  She did not use the cane some at home.  She practiced using the cane at home with home health.  She is using a walker outside the house.  She has an exercise program that she has been working.  Will review the exercise program today.  Her chief complaint at this time is difficulty sleeping.  She was advised that this is normal at this point.      PERTINENT HISTORY: L TKA on 6/302025 OA R knee and L shoulder. Bilat shoulder pain R THA in 2023 HTN Osteoporosis Anxiety Anemia Chronic fatigue PAIN:  Are you having pain? Yes: NPRS scale: 1/10 L knee; 6/10 Lt SI/ glute Pain location: see above  Pain description: knee-stiff, Lt buttock- agitated  Aggravating factors: standing and walking, sitting for too long, night time  Relieving factors: medication, Ice , rest    PRECAUTIONS: None   RED FLAGS: None           WEIGHT BEARING RESTRICTIONS: No   FALLS:  Has patient fallen in last 6 months?  No   LIVING ENVIRONMENT: 5 steps into the house   OCCUPATION:  Retired    PLOF: Independent   PATIENT GOALS:  To be able to her mother son dance by October at her sons wedding/ to be able to walk      OBJECTIVE:  Note: Objective measures were completed at Evaluation unless otherwise noted.   DIAGNOSTIC FINDINGS:  Nothing post op    PATIENT SURVEYS:  LEFS  Extreme difficulty/unable (0), Quite a bit of difficulty (1), Moderate difficulty (2), Little difficulty (3), No difficulty (4) Survey date:  eval 8/28  Any of your usual work, housework or school activities  4  2. Usual hobbies, recreational or sporting activities  3  3. Getting into/out of the bath  3  4. Walking between rooms  3  5. Putting on  socks/shoes  3  6. Squatting   3  7. Lifting an object, like a bag of groceries from the floor  2  8. Performing light activities around your home  1  9. Performing heavy activities around your home  2  10. Getting into/out of a car  3  11. Walking 2 blocks  1  12. Walking 1 mile  1  13. Going up/down 10 stairs (1 flight)  1  14. Standing for 1 hour  1  15.  sitting for 1 hour  1  16. Running on even ground  1  17. Running on uneven ground  1  18. Making sharp turns while running fast  1  19. Hopping   1  20. Rolling over in bed  2  Score total:  23/80 38/80      COGNITION: Overall cognitive status: Within functional limits for tasks assessed                               SENSATION:   EDEMA:  Circumferential: 44.5 R  48.7 L    MUSCLE LENGTH:   POSTURE: rounded shoulders and forward head   PALPATION: No unexpected TTP    LOWER EXTREMITY ROM:   Passive ROM Right eval Left eval left left Left  8/11  L  8/18  Hip flexion          Hip extension          Hip abduction          Hip adduction          Hip internal rotation          Hip external rotation          Knee flexion   96  100 103 118  123  Knee extension    -3 -3     Ankle dorsiflexion          Ankle plantarflexion          Ankle inversion          Ankle eversion           (Blank rows = not tested)   LOWER EXTREMITY MMT:   MMT (lb) Right 8/21 Left 8/21 Right  Left   Hip flexion     4 4+  Hip extension        Hip abduction     4 4+  Hip adduction        Hip internal rotation        Hip external rotation        Knee flexion  35.3 38.8     Knee extension (age norm 60lb)   46.0 46.1 46.0 ( from previous visit)  46.1 ( from previous visit)   Ankle dorsiflexion        Ankle plantarflexion        Ankle inversion        Ankle eversion         (Blank rows = not tested)       GAIT: Decreased left hip flexion Forward trunk flexion  Decreased left single leg                                                                                                                                     TREATMENT DATE:   [facility] Adult PT Treatment:                                             Date: 05/26/24 Pt seen for aquatic therapy today.  Treatment took place in water 3.5-4.75 ft in depth at the [facility] pool. Temp of water was 91.  Pt entered/exited the pool via stairs independently with bil rail.  - unsupported walking forward/ backward, multiple laps - unsupported side stepping -> with arm add/abdct with rainbow hand floats -  forward walking kick  - suitcase carry with bil rainbow hand floats, marching forward/backward, - Rt/Lt hip hike on first step - heavy cues for technique (limited tolerance in L SLS)  - heel raises x 10 - squats pushing single yellow hand float under water 2x10 - Lt runners step ups 2 x 10 with light UE support on rails - STS at bench in water, with forward RUE reach and slow controlled descent, x 12 - Lt/Rt/Lt hamstring stretch with heel on 2nd step x 20sec  - when dried off: applied reg Rock tape in zigzag pattern to Lt knee incision to assist with scar mobilization and desensitization. Pt instructed in safe tape removal techniques; verbalized understanding.  9/18 Bike 5 min  Standing squat back from bar Seated pelvis twisting + forward hinge for Lt hip external rotator stretch Removed Lt flip flop- Rt hip hike Gait with Rt hip hike    [facility] Adult PT Treatment:                                             Date: 05/18/24 Pt seen for aquatic therapy today.  Treatment took place in water 3.5-4.75 ft in depth at the [facility] pool. Temp of water was 91.  Pt entered/exited the pool via stairs independently with bil rail.  - unsupported walking forward/ backward - unsupported side stepping  - marching in place-> marching backward and forward walking kick  -  UE On yellow hand floats: 3 way straight leg kick x 10 each LE; heel raises x 10 - squats  pushing single yellow hand float under water 2x10 - Lt runners step ups x 10 with light UE support on rails - Lt lateral step ups x 10 with light RUE on wall  - straddling noodle without UE support: cycling  - Lt hamstring stretch with heel on 2nd step x 20sec - when dried off: applied reg Rock tape in zigzag pattern to Lt knee incision to assist with scar mobilization and desensitization. Pt instructed in safe tape removal techniques; verbalized understanding.   Pt requires the buoyancy and hydrostatic pressure of water for support, and to offload joints by unweighting joint load by at least 50 % in navel deep water and by at least 75-80% in chest to neck deep water.  Viscosity of the water is needed for resistance of strengthening. Water current perturbations provides challenge to standing balance requiring increased core activation.     9/11 Manual:  Trigger point release to posterior knee and quad  Review of self trigger point release to the quad.  Reviewed use of her muscle roller   There-ex:  SLR 3x10  LAQ 3x10 2lb  SAQ 3x10 2 lb   There-act:  2 inch 2x10  Side step 2x10   Review of ROM and strength measurements     9/9 Manual:  Trigger point release to posterior knee and quad  Review of self trigger point release to the quad.  Reviewed use of her muscle roller   There-ex:  SLR 3x10  LAQ 3x10 2lb  SAQ 3x10 2 lb    There-ex:   Heel/toe raise 3x12  Standing march 3x12    8/28 Recumb bike 10 min LAQ arms across chest, hold with core engagement Roller to hamstrings, gastroc, STM Lt lateral hip Educated on use of theracane.   8/21 Nu step 6 min Manual to right hip Discussion regarding TKA on Rt Trigger point release to gluteal and lower back using a tennis ball  Review of soft tissue mobilization of the back   There-ex:  SLR 3x10  LAQ 3x10 2lb  SAQ 3x10 2lb  LTR 2x10  Hip abduction 3x10 red    8/18 Manual:  Trigger point release to posterior knee  and quad  Review of self trigger point release to the quad.  Reviewed use of her muscle roller   There-ex:  SLR 3x10  LAQ 3x10 2lb  SAQ 3x10 2 lb   There-act:  Step up 4 inch 2x10  Heel/toe raise 2x12     PATIENT EDUCATION:  Education details:   Person educated: Patient Education method: Programmer, multimedia, Facilities manager, Actor cues, Verbal cues Education comprehension: verbalized understanding, returned demonstration, verbal cues required, tactile cues required, and needs further education   HOME EXERCISE PROGRAM: Access Code: [email or web address] URL: [email or web address] Date: 03/22/2024 Prepared by: Harlene Cordon  Exercises - Seated Hamstring Stretch  - 2-3 x daily - 7 x weekly - 2 sets - 5 breaths hold - Seated Heel Slide  - 5 x daily - 7 x weekly - 1 sets - 10 reps - Standing Gluteal Sets  - 5 x daily - 7 x weekly - 1 sets - 10 reps - Standing Weight Shift Side to Side Eyes Closed  - 5 x daily - 7 x weekly - 1 sets - 10 reps - Mini Squat  - 5 x daily - 7 x weekly - 1 sets - 10 reps -  Sidelying Hip Abduction  - 3 x daily - 7 x weekly - 3 sets - 10 reps   ASSESSMENT:   CLINICAL IMPRESSION: Pt had difficulty tolerating hip hike in pool today, reporting increase Lt glute/hip irritation afterwards.   She reported some relief of symptoms with Lt forward runners step ups.  Plan to work on STS again in hopes for improved carry over on land.  Progressing well towards remaining goals. Will look into [facility] with pool and consider if PREP program is appropriate for her.   Eval:  Patient is a 63 y.o. female 2 weeks s/p L TKA who was seen today for physical therapy evaluation and treatment for decreased range of motion, strength, ability to ambulate, and general functional mobility.  She who presents with expected limitations in all above deficits.  She is using the walker at this time for primary mobility outside the house.  She has expected swelling in her knee.  She would benefit  from skilled therapy to return to active lifestyle.    OBJECTIVE IMPAIRMENTS: Abnormal gait, decreased activity tolerance, decreased mobility, difficulty walking, decreased ROM, decreased strength, increased edema, increased muscle spasms, and pain.    ACTIVITY LIMITATIONS: carrying, lifting, bending, standing, squatting, stairs, transfers, bed mobility, bathing, and locomotion level   PARTICIPATION LIMITATIONS: meal prep, cleaning, laundry, driving, shopping, community activity, occupation, and yard work   PERSONAL FACTORS: 1-2 comorbidities: hip OA  are also affecting patient's functional outcome.    REHAB POTENTIAL: Good   CLINICAL DECISION MAKING: Stable/uncomplicated   EVALUATION COMPLEXITY: Low     GOALS: Goals reviewed with patient? Yes  04/13/2024     SHORT TERM GOALS: Target date:  Patient will increase passive knee flexion to 128 degrees on the left Baseline: Goal status: Achieved 9/11 goal updated to 128   2.  Patient will increase passive left knee extension to full Baseline:  Goal status: Achieved 9/11 3.  Patient will progress off assistive device and ambulate greater than 500 feet without significant antalgic gait Baseline:  Goal status: Still using cane.  Patient reports she does not feel safe off the cane yet 9/11   4.  Patient will be independent with HEP Baseline:  Goal status: Has initial HEP achieved 9/11     LONG TERM GOALS: Target date: POC     Patient will go up and down 12 steps with reciprocal gait pattern without increased pain Baseline:  Goal status: Begin cane training 9/11   2.  Patient will squat pick items up off the ground without increased pain Baseline:  Goal status: Working on squats but not achieved 9/11   3.  Patient will ambulate community distances without pain Baseline:  Goal status: Still using cane and limited distances 9/11   4.  Patient will have complete HEP to promote strengthening Baseline:  Goal status:  Continue to progress towards end of HEP 9/11     PLAN:   PT FREQUENCY: 2x/week   PT DURATION: 8 weeks   PLANNED INTERVENTIONS: 97110-Therapeutic exercises, 97530- Therapeutic activity, 97112- Neuromuscular re-education, 97535- Self Care, [id number]- Manual therapy, [id number]- Gait training, [phone]- Aquatic Therapy, [id number]- , [id number]- Ultrasound, Patient/Family education, Stair training, Dry Needling, DME instructions, Cryotherapy, and Moist heat    PLAN FOR NEXT SESSION:  Progressed to functional strengthening such as squats and steps as tolerated.   Delon Aquas, PTA 05/26/24 12:44 PM [facility] [address] [place], [place], [address] Phone: [phone]   Fax:  [phone]  Referring diagnosis? Left TKA  Treatment diagnosis? (if different than referring diagnosis) LEft knee stiffness  Left knee pain  What was this (referring dx) caused by? [x]  Surgery []  Fall []  Ongoing issue []  Arthritis []  Other: ____________  Laterality: []  Rt [x]  Lt []  Both  Check all possible CPT codes:  *CHOOSE 10 OR LESS*    See Planned Interventions listed in the Plan section of the Evaluation.

## 2024-05-28 ENCOUNTER — Encounter (HOSPITAL_BASED_OUTPATIENT_CLINIC_OR_DEPARTMENT_OTHER): Payer: Self-pay | Admitting: Physical Therapy

## 2024-05-28 ENCOUNTER — Ambulatory Visit (HOSPITAL_BASED_OUTPATIENT_CLINIC_OR_DEPARTMENT_OTHER): Payer: Self-pay | Admitting: Physical Therapy

## 2024-05-28 DIAGNOSIS — M6281 Muscle weakness (generalized): Secondary | ICD-10-CM

## 2024-05-28 DIAGNOSIS — R2689 Other abnormalities of gait and mobility: Secondary | ICD-10-CM

## 2024-05-28 DIAGNOSIS — M25562 Pain in left knee: Secondary | ICD-10-CM

## 2024-05-28 NOTE — Therapy (Signed)
 [facility]    OUTPATIENT PHYSICAL THERAPY LOWER EXTREMITY TREATMENT     Patient Name: Latasha [name] MRN: [id number] DOB:1961-03-27, 63 y.o., female Today's Date: 03/14/2024   END OF SESSION:    PT End of Session - 05/28/24 1527     Visit Number 20    Number of Visits 32    Date for Recertification  07/09/24    Authorization Type [employer] m[employer]    PT Start Time 1515    PT Stop Time 1558    PT Time Calculation (min) 43 min    Activity Tolerance Patient tolerated treatment well    Behavior During Therapy [place] for tasks assessed/performed                    Patient Active Problem List    Diagnosis Date Noted   History of total knee arthroplasty, left 03/01/2024   Tubular adenoma 02/25/2024   Severe obesity (BMI 35.0-39.9) with comorbidity (HCC) 10/12/2023   Pulmonary nodule 08/21/2023   Hx of total hip arthroplasty, right 10/03/2021   De Quervain's tenosynovitis, right 09/06/2020   B12 deficiency 07/13/2020   Family history of colon cancer 12/01/2017   Benign essential hypertension 03/01/2016   Herpes zoster without complication 03/01/2016   BP (high blood pressure) 09/05/2015   Osteoporosis, post-menopausal 09/05/2015   Postsurgical menopause 09/05/2015   Class 1 obesity 09/05/2015   GERD (gastroesophageal reflux disease) 09/05/2015   Endometriosis 09/05/2015   Status post total hysterectomy and bilateral salpingo-oophorectomy 09/05/2015   Primary osteoarthritis of both knees 09/28/2014    PCP: Oneil Pinal MD    REFERRING PROVIDER: Fonda Koyanagi  PA-C  Hooten, Lynwood Idalia Raddle., MD (surgeon)   REFERRING DIAG: Left TKA    THERAPY DIAG:  No diagnosis found.   Rationale for Evaluation and Treatment: Rehabilitation   ONSET DATE: DOS  03/01/2024   SUBJECTIVE:    SUBJECTIVE STATEMENT: Patient reports her sciatica has been ongoing.  She reports overall she has been sleeping better at night.   Eval: Pt received PT from 04-02/2024 for  bilat knee OA.  She received aquatic therapy  Pt underwent L TKA on 03/01/2024.  At this plan her pain is well-controlled.  She has been at home health and done well.  She did not use the cane some at home.  She practiced using the cane at home with home health.  She is using a walker outside the house.  She has an exercise program that she has been working.  Will review the exercise program today.  Her chief complaint at this time is difficulty sleeping.  She was advised that this is normal at this point.      PERTINENT HISTORY: L TKA on 6/302025 OA R knee and L shoulder. Bilat shoulder pain R THA in 2023 HTN Osteoporosis Anxiety Anemia Chronic fatigue PAIN:  Are you having pain? Yes: NPRS scale: 1/10 L knee; 3/10 Lt SI/ glute Pain location: see above  Pain description: knee-stiff, Lt buttock- agitated  Aggravating factors: standing and walking, sitting for too long, night time  Relieving factors: medication, Ice , rest    PRECAUTIONS: None   RED FLAGS: None           WEIGHT BEARING RESTRICTIONS: No   FALLS:  Has patient fallen in last 6 months? No   LIVING ENVIRONMENT: 5 steps into the house   OCCUPATION:  Retired    PLOF: Independent   PATIENT GOALS:  To be able to  her mother son dance by October at her sons wedding/ to be able to walk      OBJECTIVE:  Note: Objective measures were completed at Evaluation unless otherwise noted.   DIAGNOSTIC FINDINGS:  Nothing post op    PATIENT SURVEYS:  LEFS  Extreme difficulty/unable (0), Quite a bit of difficulty (1), Moderate difficulty (2), Little difficulty (3), No difficulty (4) Survey date:  eval 8/28  Any of your usual work, housework or school activities  4  2. Usual hobbies, recreational or sporting activities  3  3. Getting into/out of the bath  3  4. Walking between rooms  3  5. Putting on socks/shoes  3  6. Squatting   3  7. Lifting an object, like a bag of groceries from the floor  2  8. Performing light  activities around your home  1  9. Performing heavy activities around your home  2  10. Getting into/out of a car  3  11. Walking 2 blocks  1  12. Walking 1 mile  1  13. Going up/down 10 stairs (1 flight)  1  14. Standing for 1 hour  1  15.  sitting for 1 hour  1  16. Running on even ground  1  17. Running on uneven ground  1  18. Making sharp turns while running fast  1  19. Hopping   1  20. Rolling over in bed  2  Score total:  23/80 38/80      COGNITION: Overall cognitive status: Within functional limits for tasks assessed                               SENSATION:   EDEMA:  Circumferential: 44.5 R  48.7 L    MUSCLE LENGTH:   POSTURE: rounded shoulders and forward head   PALPATION: No unexpected TTP    LOWER EXTREMITY ROM:   Passive ROM Right eval Left eval left left Left  8/11  L  8/18  Hip flexion          Hip extension          Hip abduction          Hip adduction          Hip internal rotation          Hip external rotation          Knee flexion   96  100 103 118  123  Knee extension    -3 -3     Ankle dorsiflexion          Ankle plantarflexion          Ankle inversion          Ankle eversion           (Blank rows = not tested)   LOWER EXTREMITY MMT:   MMT (lb) Right 8/21 Left 8/21 Right  Left   Hip flexion     4 4+  Hip extension        Hip abduction     4 4+  Hip adduction        Hip internal rotation        Hip external rotation        Knee flexion  35.3 38.8     Knee extension (age norm 60lb)   46.0 46.1 46.0 ( from previous visit)  46.1 ( from previous visit)   Ankle  dorsiflexion        Ankle plantarflexion        Ankle inversion        Ankle eversion         (Blank rows = not tested)       GAIT: Decreased left hip flexion Forward trunk flexion  Decreased left single leg                                                                                                                                    TREATMENT DATE:   [facility]  Adult PT  09/26 Manual:  Trigger point release to posterior knee and quad  Review of self trigger point release to the quad.  Patella ROM  Extension stretching  There-ex:   LAQ 3x10 2lb  SAQ 3x10 2 lb  Hamstring stretch reviewed technique 3 x 20-second hold Quad set 3 x 15   There-act:  2 inch 2x10 step down  4 inch step up     Treatment:                                             Date: 05/26/24 Pt seen for aquatic therapy today.  Treatment took place in water 3.5-4.75 ft in depth at the [facility] pool. Temp of water was 91.  Pt entered/exited the pool via stairs independently with bil rail.  - unsupported walking forward/ backward, multiple laps - unsupported side stepping -> with arm add/abdct with rainbow hand floats -  forward walking kick  - suitcase carry with bil rainbow hand floats, marching forward/backward, - Rt/Lt hip hike on first step - heavy cues for technique (limited tolerance in L SLS)  - heel raises x 10 - squats pushing single yellow hand float under water 2x10 - Lt runners step ups 2 x 10 with light UE support on rails - STS at bench in water, with forward RUE reach and slow controlled descent, x 12 - Lt/Rt/Lt hamstring stretch with heel on 2nd step x 20sec  - when dried off: applied reg Rock tape in zigzag pattern to Lt knee incision to assist with scar mobilization and desensitization. Pt instructed in safe tape removal techniques; verbalized understanding.  9/18 Bike 5 min  Standing squat back from bar Seated pelvis twisting + forward hinge for Lt hip external rotator stretch Removed Lt flip flop- Rt hip hike Gait with Rt hip hike    [facility] Adult PT Treatment:                                             Date: 05/18/24 Pt seen for aquatic therapy today.  Treatment took place in water 3.5-4.75 ft in  depth at the [facility] pool. Temp of water was 91.  Pt entered/exited the pool via stairs independently with bil rail.  -  unsupported walking forward/ backward - unsupported side stepping  - marching in place-> marching backward and forward walking kick  - UE On yellow hand floats: 3 way straight leg kick x 10 each LE; heel raises x 10 - squats pushing single yellow hand float under water 2x10 - Lt runners step ups x 10 with light UE support on rails - Lt lateral step ups x 10 with light RUE on wall  - straddling noodle without UE support: cycling  - Lt hamstring stretch with heel on 2nd step x 20sec - when dried off: applied reg Rock tape in zigzag pattern to Lt knee incision to assist with scar mobilization and desensitization. Pt instructed in safe tape removal techniques; verbalized understanding.   Pt requires the buoyancy and hydrostatic pressure of water for support, and to offload joints by unweighting joint load by at least 50 % in navel deep water and by at least 75-80% in chest to neck deep water.  Viscosity of the water is needed for resistance of strengthening. Water current perturbations provides challenge to standing balance requiring increased core activation.     9/11 Manual:  Trigger point release to posterior knee and quad  Review of self trigger point release to the quad.  Reviewed use of her muscle roller   There-ex:  SLR 3x10  LAQ 3x10 2lb  SAQ 3x10 2 lb   There-act:  2 inch 2x10  Side step 2x10   Review of ROM and strength measurements     9/9 Manual:  Trigger point release to posterior knee and quad  Review of self trigger point release to the quad.  Reviewed use of her muscle roller   There-ex:  SLR 3x10  LAQ 3x10 2lb  SAQ 3x10 2 lb    There-ex:   Heel/toe raise 3x12  Standing march 3x12    8/28 Recumb bike 10 min LAQ arms across chest, hold with core engagement Roller to hamstrings, gastroc, STM Lt lateral hip Educated on use of theracane.   8/21 Nu step 6 min Manual to right hip Discussion regarding TKA on Rt Trigger point release to gluteal and  lower back using a tennis ball  Review of soft tissue mobilization of the back   There-ex:  SLR 3x10  LAQ 3x10 2lb  SAQ 3x10 2lb  LTR 2x10  Hip abduction 3x10 red    8/18 Manual:  Trigger point release to posterior knee and quad  Review of self trigger point release to the quad.  Reviewed use of her muscle roller   There-ex:  SLR 3x10  LAQ 3x10 2lb  SAQ 3x10 2 lb   There-act:  Step up 4 inch 2x10  Heel/toe raise 2x12     PATIENT EDUCATION:  Education details:   Person educated: Patient Education method: Programmer, multimedia, Facilities manager, Actor cues, Verbal cues Education comprehension: verbalized understanding, returned demonstration, verbal cues required, tactile cues required, and needs further education   HOME EXERCISE PROGRAM: Access Code: [email or web address] URL: [email or web address] Date: 03/22/2024 Prepared by: Harlene Cordon  Exercises - Seated Hamstring Stretch  - 2-3 x daily - 7 x weekly - 2 sets - 5 breaths hold - Seated Heel Slide  - 5 x daily - 7 x weekly - 1 sets - 10 reps - Standing Gluteal Sets  - 5 x daily - 7 x weekly - 1 sets -  10 reps - Standing Weight Shift Side to Side Eyes Closed  - 5 x daily - 7 x weekly - 1 sets - 10 reps - Mini Squat  - 5 x daily - 7 x weekly - 1 sets - 10 reps - Sidelying Hip Abduction  - 3 x daily - 7 x weekly - 3 sets - 10 reps   ASSESSMENT:   CLINICAL IMPRESSION: The patient continues to progress well. She tolerated step downs well. She was encouraged to continue with squats at home. She had full ROM. Therapy will continue to progress as tolerated.  Therapy will continue to progress functional strengthneing as tolerated.    Progressing well towards remaining goals. Will look into [facility] with pool and consider if PREP program is appropriate for her.   Eval:  Patient is a 63 y.o. female 2 weeks s/p L TKA who was seen today for physical therapy evaluation and treatment for decreased range of motion, strength, ability  to ambulate, and general functional mobility.  She who presents with expected limitations in all above deficits.  She is using the walker at this time for primary mobility outside the house.  She has expected swelling in her knee.  She would benefit from skilled therapy to return to active lifestyle.    OBJECTIVE IMPAIRMENTS: Abnormal gait, decreased activity tolerance, decreased mobility, difficulty walking, decreased ROM, decreased strength, increased edema, increased muscle spasms, and pain.    ACTIVITY LIMITATIONS: carrying, lifting, bending, standing, squatting, stairs, transfers, bed mobility, bathing, and locomotion level   PARTICIPATION LIMITATIONS: meal prep, cleaning, laundry, driving, shopping, community activity, occupation, and yard work   PERSONAL FACTORS: 1-2 comorbidities: hip OA  are also affecting patient's functional outcome.    REHAB POTENTIAL: Good   CLINICAL DECISION MAKING: Stable/uncomplicated   EVALUATION COMPLEXITY: Low     GOALS: Goals reviewed with patient? Yes  04/13/2024     SHORT TERM GOALS: Target date:  Patient will increase passive knee flexion to 128 degrees on the left Baseline: Goal status: Achieved 9/11 goal updated to 128   2.  Patient will increase passive left knee extension to full Baseline:  Goal status: Achieved 9/11 3.  Patient will progress off assistive device and ambulate greater than 500 feet without significant antalgic gait Baseline:  Goal status: Still using cane.  Patient reports she does not feel safe off the cane yet 9/11   4.  Patient will be independent with HEP Baseline:  Goal status: Has initial HEP achieved 9/11     LONG TERM GOALS: Target date: POC     Patient will go up and down 12 steps with reciprocal gait pattern without increased pain Baseline:  Goal status: Begin cane training 9/11   2.  Patient will squat pick items up off the ground without increased pain Baseline:  Goal status: Working on squats  but not achieved 9/11   3.  Patient will ambulate community distances without pain Baseline:  Goal status: Still using cane and limited distances 9/11   4.  Patient will have complete HEP to promote strengthening Baseline:  Goal status: Continue to progress towards end of HEP 9/11     PLAN:   PT FREQUENCY: 2x/week   PT DURATION: 8 weeks   PLANNED INTERVENTIONS: 97110-Therapeutic exercises, 97530- Therapeutic activity, 97112- Neuromuscular re-education, 97535- Self Care, [id number]- Manual therapy, [id number]- Gait training, [phone]- Aquatic Therapy, [id number]- , [id number]- Ultrasound, Patient/Family education, Stair training, Dry Needling, DME instructions, Cryotherapy, and Moist heat    PLAN  FOR NEXT SESSION:  Progressed to functional strengthening such as squats and steps as tolerated.   Delon Aquas, PTA 05/28/24 3:39 PM [facility] [address] [place], [place], [address] Phone: [phone]   Fax:  [phone]     Referring diagnosis? Left TKA  Treatment diagnosis? (if different than referring diagnosis) LEft knee stiffness  Left knee pain  What was this (referring dx) caused by? [x]  Surgery []  Fall []  Ongoing issue []  Arthritis []  Other: ____________  Laterality: []  Rt [x]  Lt []  Both  Check all possible CPT codes:  *CHOOSE 10 OR LESS*    See Planned Interventions listed in the Plan section of the Evaluation.

## 2024-05-30 ENCOUNTER — Encounter (HOSPITAL_BASED_OUTPATIENT_CLINIC_OR_DEPARTMENT_OTHER): Payer: Self-pay | Admitting: Physical Therapy

## 2024-05-31 ENCOUNTER — Ambulatory Visit (HOSPITAL_BASED_OUTPATIENT_CLINIC_OR_DEPARTMENT_OTHER): Admitting: Physical Therapy

## 2024-06-02 ENCOUNTER — Encounter (HOSPITAL_BASED_OUTPATIENT_CLINIC_OR_DEPARTMENT_OTHER): Payer: Self-pay

## 2024-06-02 ENCOUNTER — Encounter (HOSPITAL_BASED_OUTPATIENT_CLINIC_OR_DEPARTMENT_OTHER): Payer: Self-pay | Admitting: Physical Therapy

## 2024-06-02 ENCOUNTER — Ambulatory Visit (HOSPITAL_BASED_OUTPATIENT_CLINIC_OR_DEPARTMENT_OTHER): Attending: Student | Admitting: Physical Therapy

## 2024-06-02 ENCOUNTER — Ambulatory Visit (HOSPITAL_BASED_OUTPATIENT_CLINIC_OR_DEPARTMENT_OTHER): Admitting: Physical Therapy

## 2024-06-02 DIAGNOSIS — M6281 Muscle weakness (generalized): Secondary | ICD-10-CM | POA: Diagnosis present

## 2024-06-02 DIAGNOSIS — M25561 Pain in right knee: Secondary | ICD-10-CM | POA: Diagnosis present

## 2024-06-02 DIAGNOSIS — M25562 Pain in left knee: Secondary | ICD-10-CM | POA: Insufficient documentation

## 2024-06-02 DIAGNOSIS — G8929 Other chronic pain: Secondary | ICD-10-CM | POA: Diagnosis present

## 2024-06-02 DIAGNOSIS — R2689 Other abnormalities of gait and mobility: Secondary | ICD-10-CM | POA: Diagnosis present

## 2024-06-02 NOTE — Therapy (Signed)
 [facility]    OUTPATIENT PHYSICAL THERAPY LOWER EXTREMITY TREATMENT     Patient Name: Latasha [name] MRN: [id number] DOB:1960-12-01, 63 y.o., female Today's Date: 03/14/2024   END OF SESSION:    PT End of Session - 06/02/24 1628     Visit Number 21    Number of Visits 32    Date for Recertification  07/09/24    Authorization Type [employer] m[employer]    PT Start Time 1617    PT Stop Time 1700    PT Time Calculation (min) 43 min    Activity Tolerance Patient tolerated treatment well    Behavior During Therapy [facility] for tasks assessed/performed                    Patient Active Problem List    Diagnosis Date Noted   History of total knee arthroplasty, left 03/01/2024   Tubular adenoma 02/25/2024   Severe obesity (BMI 35.0-39.9) with comorbidity (HCC) 10/12/2023   Pulmonary nodule 08/21/2023   Hx of total hip arthroplasty, right 10/03/2021   De Quervain's tenosynovitis, right 09/06/2020   B12 deficiency 07/13/2020   Family history of colon cancer 12/01/2017   Benign essential hypertension 03/01/2016   Herpes zoster without complication 03/01/2016   BP (high blood pressure) 09/05/2015   Osteoporosis, post-menopausal 09/05/2015   Postsurgical menopause 09/05/2015   Class 1 obesity 09/05/2015   GERD (gastroesophageal reflux disease) 09/05/2015   Endometriosis 09/05/2015   Status post total hysterectomy and bilateral salpingo-oophorectomy 09/05/2015   Primary osteoarthritis of both knees 09/28/2014    PCP: Oneil Pinal MD    REFERRING PROVIDER: Fonda Koyanagi  PA-C  Hooten, Lynwood Idalia Raddle., MD (surgeon)   REFERRING DIAG: Left TKA    THERAPY DIAG:  No diagnosis found.   Rationale for Evaluation and Treatment: Rehabilitation   ONSET DATE: DOS  03/01/2024   SUBJECTIVE:    SUBJECTIVE STATEMENT: Patient reports her sciatica has increased and has had a throbbing pain in across medial left knee joint line that has been coming and going.. Left knee  pain today 4/10 aggravating pain   Eval: Pt received PT from 04-02/2024 for bilat knee OA.  She received aquatic therapy  Pt underwent L TKA on 03/01/2024.  At this plan her pain is well-controlled.  She has been at home health and done well.  She did not use the cane some at home.  She practiced using the cane at home with home health.  She is using a walker outside the house.  She has an exercise program that she has been working.  Will review the exercise program today.  Her chief complaint at this time is difficulty sleeping.  She was advised that this is normal at this point.      PERTINENT HISTORY: L TKA on 6/302025 OA R knee and L shoulder. Bilat shoulder pain R THA in 2023 HTN Osteoporosis Anxiety Anemia Chronic fatigue PAIN:  Are you having pain? Yes: NPRS scale: 1/10 L knee; 3/10 Lt SI/ glute Pain location: see above  Pain description: knee-stiff, Lt buttock- agitated  Aggravating factors: standing and walking, sitting for too long, night time  Relieving factors: medication, Ice , rest    PRECAUTIONS: None   RED FLAGS: None           WEIGHT BEARING RESTRICTIONS: No   FALLS:  Has patient fallen in last 6 months? No   LIVING ENVIRONMENT: 5 steps into the house   OCCUPATION:  Retired  PLOF: Independent   PATIENT GOALS:  To be able to her mother son dance by October at her sons wedding/ to be able to walk      OBJECTIVE:  Note: Objective measures were completed at Evaluation unless otherwise noted.   DIAGNOSTIC FINDINGS:  Nothing post op    PATIENT SURVEYS:  LEFS  Extreme difficulty/unable (0), Quite a bit of difficulty (1), Moderate difficulty (2), Little difficulty (3), No difficulty (4) Survey date:  eval 8/28  Any of your usual work, housework or school activities  4  2. Usual hobbies, recreational or sporting activities  3  3. Getting into/out of the bath  3  4. Walking between rooms  3  5. Putting on socks/shoes  3  6. Squatting   3  7.  Lifting an object, like a bag of groceries from the floor  2  8. Performing light activities around your home  1  9. Performing heavy activities around your home  2  10. Getting into/out of a car  3  11. Walking 2 blocks  1  12. Walking 1 mile  1  13. Going up/down 10 stairs (1 flight)  1  14. Standing for 1 hour  1  15.  sitting for 1 hour  1  16. Running on even ground  1  17. Running on uneven ground  1  18. Making sharp turns while running fast  1  19. Hopping   1  20. Rolling over in bed  2  Score total:  23/80 38/80      COGNITION: Overall cognitive status: Within functional limits for tasks assessed                               SENSATION:   EDEMA:  Circumferential: 44.5 R  48.7 L    MUSCLE LENGTH:   POSTURE: rounded shoulders and forward head   PALPATION: No unexpected TTP    LOWER EXTREMITY ROM:   Passive ROM Right eval Left eval left left Left  8/11  L  8/18  Hip flexion          Hip extension          Hip abduction          Hip adduction          Hip internal rotation          Hip external rotation          Knee flexion   96  100 103 118  123  Knee extension    -3 -3     Ankle dorsiflexion          Ankle plantarflexion          Ankle inversion          Ankle eversion           (Blank rows = not tested)   LOWER EXTREMITY MMT:   MMT (lb) Right 8/21 Left 8/21 Right  Left   Hip flexion     4 4+  Hip extension        Hip abduction     4 4+  Hip adduction        Hip internal rotation        Hip external rotation        Knee flexion  35.3 38.8     Knee extension (age norm 60lb)   46.0 46.1 46.0 ( from  previous visit)  46.1 ( from previous visit)   Ankle dorsiflexion        Ankle plantarflexion        Ankle inversion        Ankle eversion         (Blank rows = not tested)       GAIT: Decreased left hip flexion Forward trunk flexion  Decreased left single leg                                                                                                                                     TREATMENT DATE:   [facility] Adult PT  Treatment:                                             Date: 06/02/24 Pt seen for aquatic therapy today.  Treatment took place in water 3.5-4.75 ft in depth at the [facility] pool. Temp of water was 91.  Pt entered/exited the pool via stairs independently with bil rail.  - unsupported walking forward/ backward, multiple laps facing lap pool to assist with leveling pelvis - unsupported side stepping -> with arm add/abdct  -decompression position with noodle wrapped posterior across chest cycling -  forward walking kick -backward amb - heel raises x 10 - squats pushing single yellow hand float under water 2x10    - when dried off: applied reg Rock tape in zigzag pattern to Lt knee incision to assist with scar mobilization and desensitization. Pt instructed in safe tape removal techniques; verbalized understanding.  09/26 Manual:  Trigger point release to posterior knee and quad  Review of self trigger point release to the quad.  Patella ROM  Extension stretching  There-ex:   LAQ 3x10 2lb  SAQ 3x10 2 lb  Hamstring stretch reviewed technique 3 x 20-second hold Quad set 3 x 15   There-act:  2 inch 2x10 step down  4 inch step up     Treatment:                                             Date: 05/26/24 Pt seen for aquatic therapy today.  Treatment took place in water 3.5-4.75 ft in depth at the [facility] pool. Temp of water was 91.  Pt entered/exited the pool via stairs independently with bil rail.  - unsupported walking forward/ backward, multiple laps - unsupported side stepping -> with arm add/abdct with rainbow hand floats -  forward walking kick  - suitcase carry with bil rainbow hand floats, marching forward/backward, - Rt/Lt hip hike on first step - heavy cues for technique (limited tolerance in L SLS)  - heel raises x 10 -  squats pushing single yellow hand  float under water 2x10 - Lt runners step ups 2 x 10 with light UE support on rails - STS at bench in water, with forward RUE reach and slow controlled descent, x 12 - Lt/Rt/Lt hamstring stretch with heel on 2nd step x 20sec  - when dried off: applied reg Rock tape in zigzag pattern to Lt knee incision to assist with scar mobilization and desensitization. Pt instructed in safe tape removal techniques; verbalized understanding.  9/18 Bike 5 min  Standing squat back from bar Seated pelvis twisting + forward hinge for Lt hip external rotator stretch Removed Lt flip flop- Rt hip hike Gait with Rt hip hike    [facility] Adult PT Treatment:                                             Date: 05/18/24 Pt seen for aquatic therapy today.  Treatment took place in water 3.5-4.75 ft in depth at the [facility] pool. Temp of water was 91.  Pt entered/exited the pool via stairs independently with bil rail.  - unsupported walking forward/ backward - unsupported side stepping  - marching in place-> marching backward and forward walking kick  - UE On yellow hand floats: 3 way straight leg kick x 10 each LE; heel raises x 10 - squats pushing single yellow hand float under water 2x10 - Lt runners step ups x 10 with light UE support on rails - Lt lateral step ups x 10 with light RUE on wall  - straddling noodle without UE support: cycling  - Lt hamstring stretch with heel on 2nd step x 20sec - when dried off: applied reg Rock tape in zigzag pattern to Lt knee incision to assist with scar mobilization and desensitization. Pt instructed in safe tape removal techniques; verbalized understanding.   Pt requires the buoyancy and hydrostatic pressure of water for support, and to offload joints by unweighting joint load by at least 50 % in navel deep water and by at least 75-80% in chest to neck deep water.  Viscosity of the water is needed for resistance of strengthening. Water current perturbations provides  challenge to standing balance requiring increased core activation.     9/11 Manual:  Trigger point release to posterior knee and quad  Review of self trigger point release to the quad.  Reviewed use of her muscle roller   There-ex:  SLR 3x10  LAQ 3x10 2lb  SAQ 3x10 2 lb   There-act:  2 inch 2x10  Side step 2x10   Review of ROM and strength measurements     9/9 Manual:  Trigger point release to posterior knee and quad  Review of self trigger point release to the quad.  Reviewed use of her muscle roller   There-ex:  SLR 3x10  LAQ 3x10 2lb  SAQ 3x10 2 lb    There-ex:   Heel/toe raise 3x12  Standing march 3x12    8/28 Recumb bike 10 min LAQ arms across chest, hold with core engagement Roller to hamstrings, gastroc, STM Lt lateral hip Educated on use of theracane.   8/21 Nu step 6 min Manual to right hip Discussion regarding TKA on Rt Trigger point release to gluteal and lower back using a tennis ball  Review of soft tissue mobilization of the back   There-ex:  SLR 3x10  LAQ  3x10 2lb  SAQ 3x10 2lb  LTR 2x10  Hip abduction 3x10 red    8/18 Manual:  Trigger point release to posterior knee and quad  Review of self trigger point release to the quad.  Reviewed use of her muscle roller   There-ex:  SLR 3x10  LAQ 3x10 2lb  SAQ 3x10 2 lb   There-act:  Step up 4 inch 2x10  Heel/toe raise 2x12     PATIENT EDUCATION:  Education details:   Person educated: Patient Education method: Programmer, multimedia, Facilities manager, Actor cues, Verbal cues Education comprehension: verbalized understanding, returned demonstration, verbal cues required, tactile cues required, and needs further education   HOME EXERCISE PROGRAM: Access Code: [email or web address] URL: [email or web address] Date: 03/22/2024 Prepared by: Harlene Cordon  Exercises - Seated Hamstring Stretch  - 2-3 x daily - 7 x weekly - 2 sets - 5 breaths hold - Seated Heel Slide  - 5 x daily - 7 x  weekly - 1 sets - 10 reps - Standing Gluteal Sets  - 5 x daily - 7 x weekly - 1 sets - 10 reps - Standing Weight Shift Side to Side Eyes Closed  - 5 x daily - 7 x weekly - 1 sets - 10 reps - Mini Squat  - 5 x daily - 7 x weekly - 1 sets - 10 reps - Sidelying Hip Abduction  - 3 x daily - 7 x weekly - 3 sets - 10 reps   ASSESSMENT:   CLINICAL IMPRESSION: Majority of session spent in pain management of irritated left sciatic nerve pain.  She has a reduction in pain to 0/10 after extended time in decompression position with hip movements in all planes.  Pt instruction on use of cane in LUE rather than right to decrease antalgic limp associated with right knee OA.  Gait improves with more level pelvis likely reducing any irritation to sciatic area due to gait abnormality Progressing well towards remaining goals. Will look into [facility] with pool and consider if PREP program is appropriate for her.    Eval:  Patient is a 63 y.o. female 2 weeks s/p L TKA who was seen today for physical therapy evaluation and treatment for decreased range of motion, strength, ability to ambulate, and general functional mobility.  She who presents with expected limitations in all above deficits.  She is using the walker at this time for primary mobility outside the house.  She has expected swelling in her knee.  She would benefit from skilled therapy to return to active lifestyle.    OBJECTIVE IMPAIRMENTS: Abnormal gait, decreased activity tolerance, decreased mobility, difficulty walking, decreased ROM, decreased strength, increased edema, increased muscle spasms, and pain.    ACTIVITY LIMITATIONS: carrying, lifting, bending, standing, squatting, stairs, transfers, bed mobility, bathing, and locomotion level   PARTICIPATION LIMITATIONS: meal prep, cleaning, laundry, driving, shopping, community activity, occupation, and yard work   PERSONAL FACTORS: 1-2 comorbidities: hip OA  are also affecting patient's functional  outcome.    REHAB POTENTIAL: Good   CLINICAL DECISION MAKING: Stable/uncomplicated   EVALUATION COMPLEXITY: Low     GOALS: Goals reviewed with patient? Yes  04/13/2024     SHORT TERM GOALS: Target date:  Patient will increase passive knee flexion to 128 degrees on the left Baseline: Goal status: Achieved 9/11 goal updated to 128   2.  Patient will increase passive left knee extension to full Baseline:  Goal status: Achieved 9/11 3.  Patient will progress off assistive device and ambulate greater than  500 feet without significant antalgic gait Baseline:  Goal status: Still using cane.  Patient reports she does not feel safe off the cane yet 9/11   4.  Patient will be independent with HEP Baseline:  Goal status: Has initial HEP achieved 9/11     LONG TERM GOALS: Target date: POC     Patient will go up and down 12 steps with reciprocal gait pattern without increased pain Baseline:  Goal status: Begin cane training 9/11   2.  Patient will squat pick items up off the ground without increased pain Baseline:  Goal status: Working on squats but not achieved 9/11   3.  Patient will ambulate community distances without pain Baseline:  Goal status: Still using cane and limited distances 9/11   4.  Patient will have complete HEP to promote strengthening Baseline:  Goal status: Continue to progress towards end of HEP 9/11     PLAN:   PT FREQUENCY: 2x/week   PT DURATION: 8 weeks   PLANNED INTERVENTIONS: 97110-Therapeutic exercises, 97530- Therapeutic activity, 97112- Neuromuscular re-education, 97535- Self Care, [id number]- Manual therapy, [id number]- Gait training, [phone]- Aquatic Therapy, [id number]- , [id number]- Ultrasound, Patient/Family education, Stair training, Dry Needling, DME instructions, Cryotherapy, and Moist heat    PLAN FOR NEXT SESSION:  Progressed to functional strengthening such as squats and steps as tolerated.   Ronal Kingston) Elner Seifert MPT 06/02/24 6:06 PM [facility]  [facility] [address] [place], [place], [address] Phone: [phone]   Fax:  [phone]      Referring diagnosis? Left TKA  Treatment diagnosis? (if different than referring diagnosis) LEft knee stiffness  Left knee pain  What was this (referring dx) caused by? [x]  Surgery []  Fall []  Ongoing issue []  Arthritis []  Other: ____________  Laterality: []  Rt [x]  Lt []  Both  Check all possible CPT codes:  *CHOOSE 10 OR LESS*    See Planned Interventions listed in the Plan section of the Evaluation.

## 2024-06-04 ENCOUNTER — Ambulatory Visit (HOSPITAL_BASED_OUTPATIENT_CLINIC_OR_DEPARTMENT_OTHER): Admitting: Physical Therapy

## 2024-06-04 DIAGNOSIS — M6281 Muscle weakness (generalized): Secondary | ICD-10-CM

## 2024-06-04 DIAGNOSIS — R2689 Other abnormalities of gait and mobility: Secondary | ICD-10-CM | POA: Diagnosis not present

## 2024-06-04 DIAGNOSIS — M25562 Pain in left knee: Secondary | ICD-10-CM

## 2024-06-04 NOTE — Therapy (Signed)
 [facility]    OUTPATIENT PHYSICAL THERAPY LOWER EXTREMITY TREATMENT     Patient Name: Latasha [name] MRN: [id number] DOB:[birth year], 63 y.o., female Today's Date: 03/14/2024   END OF SESSION:    PT End of Session - 06/06/24 1903     Visit Number 22    Number of Visits 32    Date for Recertification  07/09/24    Authorization Type [employer] m[employer]    Authorization Time Period 05/17/24-07/09/24    PT Start Time 1300    PT Stop Time 1342    PT Time Calculation (min) 42 min    Activity Tolerance Patient tolerated treatment well    Behavior During Therapy [facility] for tasks assessed/performed                     Patient Active Problem List    Diagnosis Date Noted   History of total knee arthroplasty, left 03/01/2024   Tubular adenoma 02/25/2024   Severe obesity (BMI 35.0-39.9) with comorbidity (HCC) 10/12/2023   Pulmonary nodule 08/21/2023   Hx of total hip arthroplasty, right 10/03/2021   De Quervain's tenosynovitis, right 09/06/2020   B12 deficiency 07/13/2020   Family history of colon cancer 12/01/2017   Benign essential hypertension 03/01/2016   Herpes zoster without complication 03/01/2016   BP (high blood pressure) 09/05/2015   Osteoporosis, post-menopausal 09/05/2015   Postsurgical menopause 09/05/2015   Class 1 obesity 09/05/2015   GERD (gastroesophageal reflux disease) 09/05/2015   Endometriosis 09/05/2015   Status post total hysterectomy and bilateral salpingo-oophorectomy 09/05/2015   Primary osteoarthritis of both knees 09/28/2014    PCP: Oneil Pinal MD    REFERRING PROVIDER: Fonda Koyanagi  PA-C  Hooten, Lynwood Idalia Raddle., MD (surgeon)   REFERRING DIAG: Left TKA    THERAPY DIAG:  No diagnosis found.   Rationale for Evaluation and Treatment: Rehabilitation   ONSET DATE: DOS  03/01/2024   SUBJECTIVE:    SUBJECTIVE STATEMENT: Continues to report limitations secondary to sciatic pain.  Eval: Pt received PT from 04-02/2024 for  bilat knee OA.  She received aquatic therapy  Pt underwent L TKA on 03/01/2024.  At this plan her pain is well-controlled.  She has been at home health and done well.  She did not use the cane some at home.  She practiced using the cane at home with home health.  She is using a walker outside the house.  She has an exercise program that she has been working.  Will review the exercise program today.  Her chief complaint at this time is difficulty sleeping.  She was advised that this is normal at this point.      PERTINENT HISTORY: L TKA on 6/302025 OA R knee and L shoulder. Bilat shoulder pain R THA in 2023 HTN Osteoporosis Anxiety Anemia Chronic fatigue PAIN:  Are you having pain? Yes: NPRS scale: 1/10 L knee; 3/10 Lt SI/ glute Pain location: see above  Pain description: knee-stiff, Lt buttock- agitated  Aggravating factors: standing and walking, sitting for too long, night time  Relieving factors: medication, Ice , rest    PRECAUTIONS: None   RED FLAGS: None           WEIGHT BEARING RESTRICTIONS: No   FALLS:  Has patient fallen in last 6 months? No   LIVING ENVIRONMENT: 5 steps into the house   OCCUPATION:  Retired    PLOF: Independent   PATIENT GOALS:  To be able to her mother son  dance by October at her sons wedding/ to be able to walk      OBJECTIVE:  Note: Objective measures were completed at Evaluation unless otherwise noted.   DIAGNOSTIC FINDINGS:  Nothing post op    PATIENT SURVEYS:  LEFS  Extreme difficulty/unable (0), Quite a bit of difficulty (1), Moderate difficulty (2), Little difficulty (3), No difficulty (4) Survey date:  eval 8/28  Any of your usual work, housework or school activities  4  2. Usual hobbies, recreational or sporting activities  3  3. Getting into/out of the bath  3  4. Walking between rooms  3  5. Putting on socks/shoes  3  6. Squatting   3  7. Lifting an object, like a bag of groceries from the floor  2  8. Performing light  activities around your home  1  9. Performing heavy activities around your home  2  10. Getting into/out of a car  3  11. Walking 2 blocks  1  12. Walking 1 mile  1  13. Going up/down 10 stairs (1 flight)  1  14. Standing for 1 hour  1  15.  sitting for 1 hour  1  16. Running on even ground  1  17. Running on uneven ground  1  18. Making sharp turns while running fast  1  19. Hopping   1  20. Rolling over in bed  2  Score total:  23/80 38/80      COGNITION: Overall cognitive status: Within functional limits for tasks assessed                               SENSATION:   EDEMA:  Circumferential: 44.5 R  48.7 L    MUSCLE LENGTH:   POSTURE: rounded shoulders and forward head   PALPATION: No unexpected TTP    LOWER EXTREMITY ROM:   Passive ROM Right eval Left eval left left Left  8/11  L  8/18  Hip flexion          Hip extension          Hip abduction          Hip adduction          Hip internal rotation          Hip external rotation          Knee flexion   96  100 103 118  123  Knee extension    -3 -3     Ankle dorsiflexion          Ankle plantarflexion          Ankle inversion          Ankle eversion           (Blank rows = not tested)   LOWER EXTREMITY MMT:   MMT (lb) Right 8/21 Left 8/21 Right  Left   Hip flexion     4 4+  Hip extension        Hip abduction     4 4+  Hip adduction        Hip internal rotation        Hip external rotation        Knee flexion  35.3 38.8     Knee extension (age norm 60lb)   46.0 46.1 46.0 ( from previous visit)  46.1 ( from previous visit)   Ankle dorsiflexion  Ankle plantarflexion        Ankle inversion        Ankle eversion         (Blank rows = not tested)       GAIT: Decreased left hip flexion Forward trunk flexion  Decreased left single leg                                                                                                                                    TREATMENT DATE:   [facility]  Adult PT  10/3 Manual:  Trigger point release to posterior knee and quad  Review of self trigger point release to the quad.  Patella ROM  Extension stretching  There-ex:   LAQ 3x10 2lb  SAQ 3x10 2 lb  Hamstring stretch reviewed technique 3 x 20-second hold Quad set 3 x 15  There-act:  2 inch 2x10 step down  4 inch step up 2 x 10 4 inch lateral step up 2 x 10 Treatment:                                             Date: 06/02/24 Pt seen for aquatic therapy today.  Treatment took place in water 3.5-4.75 ft in depth at the [facility] pool. Temp of water was 91.  Pt entered/exited the pool via stairs independently with bil rail.  - unsupported walking forward/ backward, multiple laps facing lap pool to assist with leveling pelvis - unsupported side stepping -> with arm add/abdct  -decompression position with noodle wrapped posterior across chest cycling -  forward walking kick -backward amb - heel raises x 10 - squats pushing single yellow hand float under water 2x10    - when dried off: applied reg Rock tape in zigzag pattern to Lt knee incision to assist with scar mobilization and desensitization. Pt instructed in safe tape removal techniques; verbalized understanding.  09/26 Manual:  Trigger point release to posterior knee and quad  Review of self trigger point release to the quad.  Patella ROM  Extension stretching  There-ex:   LAQ 3x10 2lb  SAQ 3x10 2 lb  Hamstring stretch reviewed technique 3 x 20-second hold Quad set 3 x 15   There-act:  2 inch 2x10 step down  4 inch step up     Treatment:                                             Date: 05/26/24 Pt seen for aquatic therapy today.  Treatment took place in water 3.5-4.75 ft in depth at the [facility] pool. Temp of water was 91.  Pt entered/exited the pool via stairs independently with bil rail.  -  unsupported walking forward/ backward, multiple laps - unsupported side stepping -> with arm  add/abdct with rainbow hand floats -  forward walking kick  - suitcase carry with bil rainbow hand floats, marching forward/backward, - Rt/Lt hip hike on first step - heavy cues for technique (limited tolerance in L SLS)  - heel raises x 10 - squats pushing single yellow hand float under water 2x10 - Lt runners step ups 2 x 10 with light UE support on rails - STS at bench in water, with forward RUE reach and slow controlled descent, x 12 - Lt/Rt/Lt hamstring stretch with heel on 2nd step x 20sec  - when dried off: applied reg Rock tape in zigzag pattern to Lt knee incision to assist with scar mobilization and desensitization. Pt instructed in safe tape removal techniques; verbalized understanding.  9/18 Bike 5 min  Standing squat back from bar Seated pelvis twisting + forward hinge for Lt hip external rotator stretch Removed Lt flip flop- Rt hip hike Gait with Rt hip hike    [facility] Adult PT Treatment:                                             Date: 05/18/24 Pt seen for aquatic therapy today.  Treatment took place in water 3.5-4.75 ft in depth at the [facility] pool. Temp of water was 91.  Pt entered/exited the pool via stairs independently with bil rail.  - unsupported walking forward/ backward - unsupported side stepping  - marching in place-> marching backward and forward walking kick  - UE On yellow hand floats: 3 way straight leg kick x 10 each LE; heel raises x 10 - squats pushing single yellow hand float under water 2x10 - Lt runners step ups x 10 with light UE support on rails - Lt lateral step ups x 10 with light RUE on wall  - straddling noodle without UE support: cycling  - Lt hamstring stretch with heel on 2nd step x 20sec - when dried off: applied reg Rock tape in zigzag pattern to Lt knee incision to assist with scar mobilization and desensitization. Pt instructed in safe tape removal techniques; verbalized understanding.   Pt requires the buoyancy and  hydrostatic pressure of water for support, and to offload joints by unweighting joint load by at least 50 % in navel deep water and by at least 75-80% in chest to neck deep water.  Viscosity of the water is needed for resistance of strengthening. Water current perturbations provides challenge to standing balance requiring increased core activation.     9/11 Manual:  Trigger point release to posterior knee and quad  Review of self trigger point release to the quad.  Reviewed use of her muscle roller   There-ex:  SLR 3x10  LAQ 3x10 2lb  SAQ 3x10 2 lb   There-act:  2 inch 2x10  Side step 2x10   Review of ROM and strength measurements     9/9 Manual:  Trigger point release to posterior knee and quad  Review of self trigger point release to the quad.  Reviewed use of her muscle roller   There-ex:  SLR 3x10  LAQ 3x10 2lb  SAQ 3x10 2 lb    There-ex:   Heel/toe raise 3x12  Standing march 3x12    8/28 Recumb bike 10 min LAQ arms across chest, hold with core engagement  Roller to hamstrings, gastroc, STM Lt lateral hip Educated on use of theracane.   8/21 Nu step 6 min Manual to right hip Discussion regarding TKA on Rt Trigger point release to gluteal and lower back using a tennis ball  Review of soft tissue mobilization of the back   There-ex:  SLR 3x10  LAQ 3x10 2lb  SAQ 3x10 2lb  LTR 2x10  Hip abduction 3x10 red    8/18 Manual:  Trigger point release to posterior knee and quad  Review of self trigger point release to the quad.  Reviewed use of her muscle roller   There-ex:  SLR 3x10  LAQ 3x10 2lb  SAQ 3x10 2 lb   There-act:  Step up 4 inch 2x10  Heel/toe raise 2x12     PATIENT EDUCATION:  Education details:   Person educated: Patient Education method: Programmer, multimedia, Facilities manager, Actor cues, Verbal cues Education comprehension: verbalized understanding, returned demonstration, verbal cues required, tactile cues required, and needs further  education   HOME EXERCISE PROGRAM: Access Code: [email or web address] URL: [email or web address] Date: 03/22/2024 Prepared by: Harlene Cordon  Exercises - Seated Hamstring Stretch  - 2-3 x daily - 7 x weekly - 2 sets - 5 breaths hold - Seated Heel Slide  - 5 x daily - 7 x weekly - 1 sets - 10 reps - Standing Gluteal Sets  - 5 x daily - 7 x weekly - 1 sets - 10 reps - Standing Weight Shift Side to Side Eyes Closed  - 5 x daily - 7 x weekly - 1 sets - 10 reps - Mini Squat  - 5 x daily - 7 x weekly - 1 sets - 10 reps - Sidelying Hip Abduction  - 3 x daily - 7 x weekly - 3 sets - 10 reps   ASSESSMENT:   CLINICAL IMPRESSION: Therapy worked on manual therapy to her sciatic area. She had a significant improvement in pain following. Her sciatic symptoms are limiting our ability to perform functional strengthening of the knee. She was advised to get a script from her MD so we can formally treat it.. We advised her to hold on SLR's right now. Therapy will continue to progress as tolerated.    goals. Will look into [facility] with pool and consider if PREP program is appropriate for her.    Eval:  Patient is a 63 y.o. female 2 weeks s/p L TKA who was seen today for physical therapy evaluation and treatment for decreased range of motion, strength, ability to ambulate, and general functional mobility.  She who presents with expected limitations in all above deficits.  She is using the walker at this time for primary mobility outside the house.  She has expected swelling in her knee.  She would benefit from skilled therapy to return to active lifestyle.    OBJECTIVE IMPAIRMENTS: Abnormal gait, decreased activity tolerance, decreased mobility, difficulty walking, decreased ROM, decreased strength, increased edema, increased muscle spasms, and pain.    ACTIVITY LIMITATIONS: carrying, lifting, bending, standing, squatting, stairs, transfers, bed mobility, bathing, and locomotion level   PARTICIPATION  LIMITATIONS: meal prep, cleaning, laundry, driving, shopping, community activity, occupation, and yard work   PERSONAL FACTORS: 1-2 comorbidities: hip OA  are also affecting patient's functional outcome.    REHAB POTENTIAL: Good   CLINICAL DECISION MAKING: Stable/uncomplicated   EVALUATION COMPLEXITY: Low     GOALS: Goals reviewed with patient? Yes  04/13/2024     SHORT TERM GOALS: Target date:  Patient will increase passive knee  flexion to 128 degrees on the left Baseline: Goal status: Achieved 9/11 goal updated to 128   2.  Patient will increase passive left knee extension to full Baseline:  Goal status: Achieved 9/11 3.  Patient will progress off assistive device and ambulate greater than 500 feet without significant antalgic gait Baseline:  Goal status: Still using cane.  Patient reports she does not feel safe off the cane yet 9/11   4.  Patient will be independent with HEP Baseline:  Goal status: Has initial HEP achieved 9/11     LONG TERM GOALS: Target date: POC     Patient will go up and down 12 steps with reciprocal gait pattern without increased pain Baseline:  Goal status: Begin cane training 9/11   2.  Patient will squat pick items up off the ground without increased pain Baseline:  Goal status: Working on squats but not achieved 9/11   3.  Patient will ambulate community distances without pain Baseline:  Goal status: Still using cane and limited distances 9/11   4.  Patient will have complete HEP to promote strengthening Baseline:  Goal status: Continue to progress towards end of HEP 9/11     PLAN:   PT FREQUENCY: 2x/week   PT DURATION: 8 weeks   PLANNED INTERVENTIONS: 97110-Therapeutic exercises, 97530- Therapeutic activity, 97112- Neuromuscular re-education, 97535- Self Care, [id number]- Manual therapy, [id number]- Gait training, [phone]- Aquatic Therapy, [id number]- , [id number]- Ultrasound, Patient/Family education, Stair training, Dry Needling, DME  instructions, Cryotherapy, and Moist heat    PLAN FOR NEXT SESSION:  Progressed to functional strengthening such as squats and steps as tolerated.   Alm Don PT DPT 06/06/24 7:09 PM [facility] [address] [place], [place], [address] Phone: [phone]   Fax:  [phone]      Referring diagnosis? Left TKA  Treatment diagnosis? (if different than referring diagnosis) LEft knee stiffness  Left knee pain  What was this (referring dx) caused by? [x]  Surgery []  Fall []  Ongoing issue []  Arthritis []  Other: ____________  Laterality: []  Rt [x]  Lt []  Both  Check all possible CPT codes:  *CHOOSE 10 OR LESS*    See Planned Interventions listed in the Plan section of the Evaluation.

## 2024-06-06 ENCOUNTER — Encounter (HOSPITAL_BASED_OUTPATIENT_CLINIC_OR_DEPARTMENT_OTHER): Payer: Self-pay | Admitting: Physical Therapy

## 2024-06-08 ENCOUNTER — Encounter (HOSPITAL_BASED_OUTPATIENT_CLINIC_OR_DEPARTMENT_OTHER): Payer: Self-pay | Admitting: Physical Therapy

## 2024-06-08 ENCOUNTER — Ambulatory Visit (HOSPITAL_BASED_OUTPATIENT_CLINIC_OR_DEPARTMENT_OTHER): Admitting: Physical Therapy

## 2024-06-08 DIAGNOSIS — M25562 Pain in left knee: Secondary | ICD-10-CM

## 2024-06-08 DIAGNOSIS — M6281 Muscle weakness (generalized): Secondary | ICD-10-CM

## 2024-06-08 DIAGNOSIS — R2689 Other abnormalities of gait and mobility: Secondary | ICD-10-CM | POA: Diagnosis not present

## 2024-06-08 NOTE — Therapy (Signed)
 [facility]    OUTPATIENT PHYSICAL THERAPY LOWER EXTREMITY TREATMENT     Patient Name: Latasha [name] MRN: [id number] DOB:Jun 08, 1961, 63 y.o., female Today's Date: 03/14/2024   END OF SESSION:                 Patient Active Problem List    Diagnosis Date Noted   History of total knee arthroplasty, left 03/01/2024   Tubular adenoma 02/25/2024   Severe obesity (BMI 35.0-39.9) with comorbidity (HCC) 10/12/2023   Pulmonary nodule 08/21/2023   Hx of total hip arthroplasty, right 10/03/2021   De Quervain's tenosynovitis, right 09/06/2020   B12 deficiency 07/13/2020   Family history of colon cancer 12/01/2017   Benign essential hypertension 03/01/2016   Herpes zoster without complication 03/01/2016   BP (high blood pressure) 09/05/2015   Osteoporosis, post-menopausal 09/05/2015   Postsurgical menopause 09/05/2015   Class 1 obesity 09/05/2015   GERD (gastroesophageal reflux disease) 09/05/2015   Endometriosis 09/05/2015   Status post total hysterectomy and bilateral salpingo-oophorectomy 09/05/2015   Primary osteoarthritis of both knees 09/28/2014    PCP: Oneil Pinal MD    REFERRING PROVIDER: Fonda Koyanagi  PA-C  Hooten, Lynwood Idalia Raddle., MD (surgeon)   REFERRING DIAG: Left TKA    THERAPY DIAG:  No diagnosis found.   Rationale for Evaluation and Treatment: Rehabilitation   ONSET DATE: DOS  03/01/2024   SUBJECTIVE:    SUBJECTIVE STATEMENT: Continues to report limitations secondary to sciatic pain.  Eval: Pt received PT from 04-02/2024 for bilat knee OA.  She received aquatic therapy  Pt underwent L TKA on 03/01/2024.  At this plan her pain is well-controlled.  She has been at home health and done well.  She did not use the cane some at home.  She practiced using the cane at home with home health.  She is using a walker outside the house.  She has an exercise program that she has been working.  Will review the exercise program today.  Her chief  complaint at this time is difficulty sleeping.  She was advised that this is normal at this point.      PERTINENT HISTORY: L TKA on 6/302025 OA R knee and L shoulder. Bilat shoulder pain R THA in 2023 HTN Osteoporosis Anxiety Anemia Chronic fatigue PAIN:  Are you having pain? Yes: NPRS scale: 1/10 L knee; 3/10 Lt SI/ glute Pain location: see above  Pain description: knee-stiff, Lt buttock- agitated  Aggravating factors: standing and walking, sitting for too long, night time  Relieving factors: medication, Ice , rest    PRECAUTIONS: None   RED FLAGS: None           WEIGHT BEARING RESTRICTIONS: No   FALLS:  Has patient fallen in last 6 months? No   LIVING ENVIRONMENT: 5 steps into the house   OCCUPATION:  Retired    PLOF: Independent   PATIENT GOALS:  To be able to her mother son dance by October at her sons wedding/ to be able to walk      OBJECTIVE:  Note: Objective measures were completed at Evaluation unless otherwise noted.   DIAGNOSTIC FINDINGS:  Nothing post op    PATIENT SURVEYS:  LEFS  Extreme difficulty/unable (0), Quite a bit of difficulty (1), Moderate difficulty (2), Little difficulty (3), No difficulty (4) Survey date:  eval 8/28  Any of your usual work, housework or school activities  4  2. Usual hobbies, recreational or sporting activities  3  3.  Getting into/out of the bath  3  4. Walking between rooms  3  5. Putting on socks/shoes  3  6. Squatting   3  7. Lifting an object, like a bag of groceries from the floor  2  8. Performing light activities around your home  1  9. Performing heavy activities around your home  2  10. Getting into/out of a car  3  11. Walking 2 blocks  1  12. Walking 1 mile  1  13. Going up/down 10 stairs (1 flight)  1  14. Standing for 1 hour  1  15.  sitting for 1 hour  1  16. Running on even ground  1  17. Running on uneven ground  1  18. Making sharp turns while running fast  1  19. Hopping   1  20.  Rolling over in bed  2  Score total:  23/80 38/80      COGNITION: Overall cognitive status: Within functional limits for tasks assessed                               SENSATION:   EDEMA:  Circumferential: 44.5 R  48.7 L    MUSCLE LENGTH:   POSTURE: rounded shoulders and forward head   PALPATION: No unexpected TTP    LOWER EXTREMITY ROM:   Passive ROM Right eval Left eval left left Left  8/11  L  8/18  Hip flexion          Hip extension          Hip abduction          Hip adduction          Hip internal rotation          Hip external rotation          Knee flexion   96  100 103 118  123  Knee extension    -3 -3     Ankle dorsiflexion          Ankle plantarflexion          Ankle inversion          Ankle eversion           (Blank rows = not tested)   LOWER EXTREMITY MMT:   MMT (lb) Right 8/21 Left 8/21 Right  Left   Hip flexion     4 4+  Hip extension        Hip abduction     4 4+  Hip adduction        Hip internal rotation        Hip external rotation        Knee flexion  35.3 38.8     Knee extension (age norm 60lb)   46.0 46.1 46.0 ( from previous visit)  46.1 ( from previous visit)   Ankle dorsiflexion        Ankle plantarflexion        Ankle inversion        Ankle eversion         (Blank rows = not tested)       GAIT: Decreased left hip flexion Forward trunk flexion  Decreased left single leg  TREATMENT DATE:   [facility] Adult PT   Treatment:                                             Date: 06/08/24 Pt seen for aquatic therapy today.  Treatment took place in water 3.5-4.75 ft in depth at the [facility] pool. Temp of water was 91.  Pt entered/exited the pool via stairs independently with bil rail.  - unsupported walking forward/ backward, multiple laps - unsupported side stepping -> with arm add/abdct with  rainbow hand floats-> lunge -  forward walking kick ue support RBHB - SLS right 2 x 20s hold->dynamic-ue add/abd x 5 (2 LOB) - STS at bench in water, with forward RUE reach and slow controlled descent, x 12 -SL STS from bench onto floor x 5->onto water step x5 (good challenge -Sitting balance on yellow noodle in 3.6 ft (good challenge)    10/3 Manual:  Trigger point release to posterior knee and quad  Review of self trigger point release to the quad.  Patella ROM  Extension stretching  There-ex:   LAQ 3x10 2lb  SAQ 3x10 2 lb  Hamstring stretch reviewed technique 3 x 20-second hold Quad set 3 x 15  There-act:  2 inch 2x10 step down  4 inch step up 2 x 10 4 inch lateral step up 2 x 10 Treatment:                                             Date: 06/02/24 Pt seen for aquatic therapy today.  Treatment took place in water 3.5-4.75 ft in depth at the [facility] pool. Temp of water was 91.  Pt entered/exited the pool via stairs independently with bil rail.  - unsupported walking forward/ backward, multiple laps facing lap pool to assist with leveling pelvis - unsupported side stepping -> with arm add/abdct  -decompression position with noodle wrapped posterior across chest cycling -  forward walking kick -backward amb - heel raises x 10 - squats pushing single yellow hand float under water 2x10    - when dried off: applied reg Rock tape in zigzag pattern to Lt knee incision to assist with scar mobilization and desensitization. Pt instructed in safe tape removal techniques; verbalized understanding.  09/26 Manual:  Trigger point release to posterior knee and quad  Review of self trigger point release to the quad.  Patella ROM  Extension stretching  There-ex:   LAQ 3x10 2lb  SAQ 3x10 2 lb  Hamstring stretch reviewed technique 3 x 20-second hold Quad set 3 x 15   There-act:  2 inch 2x10 step down  4 inch step up     Treatment:                                              Date: 05/26/24 Pt seen for aquatic therapy today.  Treatment took place in water 3.5-4.75 ft in depth at the [facility] pool. Temp of water was 91.  Pt entered/exited the pool via stairs independently with bil rail.  - unsupported walking forward/ backward, multiple laps - unsupported side stepping -> with arm  add/abdct with rainbow hand floats -  forward walking kick  - suitcase carry with bil rainbow hand floats, marching forward/backward, - Rt/Lt hip hike on first step - heavy cues for technique (limited tolerance in L SLS)  - heel raises x 10 - squats pushing single yellow hand float under water 2x10 - Lt runners step ups 2 x 10 with light UE support on rails - STS at bench in water, with forward RUE reach and slow controlled descent, x 12 - Lt/Rt/Lt hamstring stretch with heel on 2nd step x 20sec  - when dried off: applied reg Rock tape in zigzag pattern to Lt knee incision to assist with scar mobilization and desensitization. Pt instructed in safe tape removal techniques; verbalized understanding.  9/18 Bike 5 min  Standing squat back from bar Seated pelvis twisting + forward hinge for Lt hip external rotator stretch Removed Lt flip flop- Rt hip hike Gait with Rt hip hike    [facility] Adult PT Treatment:                                             Date: 05/18/24 Pt seen for aquatic therapy today.  Treatment took place in water 3.5-4.75 ft in depth at the [facility] pool. Temp of water was 91.  Pt entered/exited the pool via stairs independently with bil rail.  - unsupported walking forward/ backward - unsupported side stepping  - marching in place-> marching backward and forward walking kick  - UE On yellow hand floats: 3 way straight leg kick x 10 each LE; heel raises x 10 - squats pushing single yellow hand float under water 2x10 - Lt runners step ups x 10 with light UE support on rails - Lt lateral step ups x 10 with light RUE on wall  -  straddling noodle without UE support: cycling  - Lt hamstring stretch with heel on 2nd step x 20sec - when dried off: applied reg Rock tape in zigzag pattern to Lt knee incision to assist with scar mobilization and desensitization. Pt instructed in safe tape removal techniques; verbalized understanding.   Pt requires the buoyancy and hydrostatic pressure of water for support, and to offload joints by unweighting joint load by at least 50 % in navel deep water and by at least 75-80% in chest to neck deep water.  Viscosity of the water is needed for resistance of strengthening. Water current perturbations provides challenge to standing balance requiring increased core activation.     9/11 Manual:  Trigger point release to posterior knee and quad  Review of self trigger point release to the quad.  Reviewed use of her muscle roller   There-ex:  SLR 3x10  LAQ 3x10 2lb  SAQ 3x10 2 lb   There-act:  2 inch 2x10  Side step 2x10   Review of ROM and strength measurements     9/9 Manual:  Trigger point release to posterior knee and quad  Review of self trigger point release to the quad.  Reviewed use of her muscle roller   There-ex:  SLR 3x10  LAQ 3x10 2lb  SAQ 3x10 2 lb    There-ex:   Heel/toe raise 3x12  Standing march 3x12    8/28 Recumb bike 10 min LAQ arms across chest, hold with core engagement Roller to hamstrings, gastroc, STM Lt lateral hip Educated on use of theracane.  8/21 Nu step 6 min Manual to right hip Discussion regarding TKA on Rt Trigger point release to gluteal and lower back using a tennis ball  Review of soft tissue mobilization of the back   There-ex:  SLR 3x10  LAQ 3x10 2lb  SAQ 3x10 2lb  LTR 2x10  Hip abduction 3x10 red    8/18 Manual:  Trigger point release to posterior knee and quad  Review of self trigger point release to the quad.  Reviewed use of her muscle roller   There-ex:  SLR 3x10  LAQ 3x10 2lb  SAQ 3x10 2 lb    There-act:  Step up 4 inch 2x10  Heel/toe raise 2x12     PATIENT EDUCATION:  Education details:   Person educated: Patient Education method: Programmer, multimedia, Facilities manager, Actor cues, Verbal cues Education comprehension: verbalized understanding, returned demonstration, verbal cues required, tactile cues required, and needs further education   HOME EXERCISE PROGRAM: Access Code: [email or web address] URL: [email or web address] Date: 03/22/2024 Prepared by: Harlene Cordon  Exercises - Seated Hamstring Stretch  - 2-3 x daily - 7 x weekly - 2 sets - 5 breaths hold - Seated Heel Slide  - 5 x daily - 7 x weekly - 1 sets - 10 reps - Standing Gluteal Sets  - 5 x daily - 7 x weekly - 1 sets - 10 reps - Standing Weight Shift Side to Side Eyes Closed  - 5 x daily - 7 x weekly - 1 sets - 10 reps - Mini Squat  - 5 x daily - 7 x weekly - 1 sets - 10 reps - Sidelying Hip Abduction  - 3 x daily - 7 x weekly - 3 sets - 10 reps   ASSESSMENT:   CLINICAL IMPRESSION: Focus today on balance, proprioceptive retraining and left le strength.  She has good toleration and is well challenged with Left SL activities.  No pain in sciatic area or knee.  Good session.  Goals ongoing     Will look into [facility] with pool and consider if PREP program is appropriate for her.    Eval:  Patient is a 63 y.o. female 2 weeks s/p L TKA who was seen today for physical therapy evaluation and treatment for decreased range of motion, strength, ability to ambulate, and general functional mobility.  She who presents with expected limitations in all above deficits.  She is using the walker at this time for primary mobility outside the house.  She has expected swelling in her knee.  She would benefit from skilled therapy to return to active lifestyle.    OBJECTIVE IMPAIRMENTS: Abnormal gait, decreased activity tolerance, decreased mobility, difficulty walking, decreased ROM, decreased strength, increased edema, increased  muscle spasms, and pain.    ACTIVITY LIMITATIONS: carrying, lifting, bending, standing, squatting, stairs, transfers, bed mobility, bathing, and locomotion level   PARTICIPATION LIMITATIONS: meal prep, cleaning, laundry, driving, shopping, community activity, occupation, and yard work   PERSONAL FACTORS: 1-2 comorbidities: hip OA  are also affecting patient's functional outcome.    REHAB POTENTIAL: Good   CLINICAL DECISION MAKING: Stable/uncomplicated   EVALUATION COMPLEXITY: Low     GOALS: Goals reviewed with patient? Yes  04/13/2024     SHORT TERM GOALS: Target date:  Patient will increase passive knee flexion to 128 degrees on the left Baseline: Goal status: Achieved 9/11 goal updated to 128   2.  Patient will increase passive left knee extension to full Baseline:  Goal status: Achieved 9/11 3.  Patient will progress off  assistive device and ambulate greater than 500 feet without significant antalgic gait Baseline:  Goal status: Still using cane.  Patient reports she does not feel safe off the cane yet 9/11   4.  Patient will be independent with HEP Baseline:  Goal status: Has initial HEP achieved 9/11     LONG TERM GOALS: Target date: POC     Patient will go up and down 12 steps with reciprocal gait pattern without increased pain Baseline:  Goal status: Begin cane training 9/11   2.  Patient will squat pick items up off the ground without increased pain Baseline:  Goal status: Working on squats but not achieved 9/11   3.  Patient will ambulate community distances without pain Baseline:  Goal status: Still using cane and limited distances 9/11   4.  Patient will have complete HEP to promote strengthening Baseline:  Goal status: Continue to progress towards end of HEP 9/11     PLAN:   PT FREQUENCY: 2x/week   PT DURATION: 8 weeks   PLANNED INTERVENTIONS: 97110-Therapeutic exercises, 97530- Therapeutic activity, 97112- Neuromuscular re-education,  97535- Self Care, [id number]- Manual therapy, [id number]- Gait training, [phone]- Aquatic Therapy, [id number]- , [id number]- Ultrasound, Patient/Family education, Stair training, Dry Needling, DME instructions, Cryotherapy, and Moist heat    PLAN FOR NEXT SESSION:  Progressed to functional strengthening such as squats and steps as tolerated.   Ronal [place]) Lanessa Shill MPT 06/08/24 12:35 PM [facility] [address] [place], [place], [address] Phone: [phone]   Fax:  [phone]       Referring diagnosis? Left TKA  Treatment diagnosis? (if different than referring diagnosis) LEft knee stiffness  Left knee pain  What was this (referring dx) caused by? [x]  Surgery []  Fall []  Ongoing issue []  Arthritis []  Other: ____________  Laterality: []  Rt [x]  Lt []  Both  Check all possible CPT codes:  *CHOOSE 10 OR LESS*    See Planned Interventions listed in the Plan section of the Evaluation.

## 2024-06-11 ENCOUNTER — Ambulatory Visit (HOSPITAL_BASED_OUTPATIENT_CLINIC_OR_DEPARTMENT_OTHER): Admitting: Physical Therapy

## 2024-06-11 ENCOUNTER — Encounter (HOSPITAL_BASED_OUTPATIENT_CLINIC_OR_DEPARTMENT_OTHER): Payer: Self-pay | Admitting: Physical Therapy

## 2024-06-11 DIAGNOSIS — M6281 Muscle weakness (generalized): Secondary | ICD-10-CM

## 2024-06-11 DIAGNOSIS — M25562 Pain in left knee: Secondary | ICD-10-CM

## 2024-06-11 DIAGNOSIS — R2689 Other abnormalities of gait and mobility: Secondary | ICD-10-CM

## 2024-06-11 NOTE — Therapy (Signed)
 [facility]    OUTPATIENT PHYSICAL THERAPY LOWER EXTREMITY TREATMENT     Patient Name: Latasha [name] MRN: [id number] DOB:[birth year], 63 y.o., female Today's Date: 03/14/2024   END OF SESSION:    PT End of Session - 06/11/24 1318     Visit Number 24    Number of Visits 32    Date for Recertification  07/09/24    Authorization Type [employer] m[employer]    Authorization Time Period 05/17/24-07/09/24    PT Start Time 1310   late pt arrival   PT Stop Time 1340    PT Time Calculation (min) 30 min    Activity Tolerance Patient tolerated treatment well    Behavior During Therapy [facility] for tasks assessed/performed                      Patient Active Problem List    Diagnosis Date Noted   History of total knee arthroplasty, left 03/01/2024   Tubular adenoma 02/25/2024   Severe obesity (BMI 35.0-39.9) with comorbidity (HCC) 10/12/2023   Pulmonary nodule 08/21/2023   Hx of total hip arthroplasty, right 10/03/2021   De Quervain's tenosynovitis, right 09/06/2020   B12 deficiency 07/13/2020   Family history of colon cancer 12/01/2017   Benign essential hypertension 03/01/2016   Herpes zoster without complication 03/01/2016   BP (high blood pressure) 09/05/2015   Osteoporosis, post-menopausal 09/05/2015   Postsurgical menopause 09/05/2015   Class 1 obesity 09/05/2015   GERD (gastroesophageal reflux disease) 09/05/2015   Endometriosis 09/05/2015   Status post total hysterectomy and bilateral salpingo-oophorectomy 09/05/2015   Primary osteoarthritis of both knees 09/28/2014    PCP: Oneil Pinal MD    REFERRING PROVIDER: Fonda Koyanagi  PA-C  Hooten, Lynwood Idalia Raddle., MD (surgeon)   REFERRING DIAG: Left TKA    THERAPY DIAG:  No diagnosis found.   Rationale for Evaluation and Treatment: Rehabilitation   ONSET DATE: DOS  03/01/2024   SUBJECTIVE:    SUBJECTIVE STATEMENT:  Sciatica is better today, had some issues with it the past few days. Have had to do a  lot of running around for my son's wedding, traffic is bad and that's why I was a little late. Next week I'm going to ask for a cortisone shot. Other knee is out of whack.   Eval: Pt received PT from 04-02/2024 for bilat knee OA.  She received aquatic therapy  Pt underwent L TKA on 03/01/2024.  At this plan her pain is well-controlled.  She has been at home health and done well.  She did not use the cane some at home.  She practiced using the cane at home with home health.  She is using a walker outside the house.  She has an exercise program that she has been working.  Will review the exercise program today.  Her chief complaint at this time is difficulty sleeping.  She was advised that this is normal at this point.      PERTINENT HISTORY: L TKA on 6/302025 OA R knee and L shoulder. Bilat shoulder pain R THA in 2023 HTN Osteoporosis Anxiety Anemia Chronic fatigue PAIN:  Are you having pain? Yes: NPRS scale: 0/10 Pain location:  Pain description:  Aggravating factors:  Relieving factors:     PRECAUTIONS: None   RED FLAGS: None           WEIGHT BEARING RESTRICTIONS: No   FALLS:  Has patient fallen in last 6 months? No   LIVING  ENVIRONMENT: 5 steps into the house   OCCUPATION:  Retired    PLOF: Independent   PATIENT GOALS:  To be able to her mother son dance by October at her sons wedding/ to be able to walk      OBJECTIVE:  Note: Objective measures were completed at Evaluation unless otherwise noted.   DIAGNOSTIC FINDINGS:  Nothing post op    PATIENT SURVEYS:  LEFS  Extreme difficulty/unable (0), Quite a bit of difficulty (1), Moderate difficulty (2), Little difficulty (3), No difficulty (4) Survey date:  eval 8/28  Any of your usual work, housework or school activities  4  2. Usual hobbies, recreational or sporting activities  3  3. Getting into/out of the bath  3  4. Walking between rooms  3  5. Putting on socks/shoes  3  6. Squatting   3  7. Lifting an  object, like a bag of groceries from the floor  2  8. Performing light activities around your home  1  9. Performing heavy activities around your home  2  10. Getting into/out of a car  3  11. Walking 2 blocks  1  12. Walking 1 mile  1  13. Going up/down 10 stairs (1 flight)  1  14. Standing for 1 hour  1  15.  sitting for 1 hour  1  16. Running on even ground  1  17. Running on uneven ground  1  18. Making sharp turns while running fast  1  19. Hopping   1  20. Rolling over in bed  2  Score total:  23/80 38/80      COGNITION: Overall cognitive status: Within functional limits for tasks assessed                               SENSATION:   EDEMA:  Circumferential: 44.5 R  48.7 L    MUSCLE LENGTH:   POSTURE: rounded shoulders and forward head   PALPATION: No unexpected TTP    LOWER EXTREMITY ROM:   Passive ROM Right eval Left eval left left Left  8/11  L  8/18  Hip flexion          Hip extension          Hip abduction          Hip adduction          Hip internal rotation          Hip external rotation          Knee flexion   96  100 103 118  123  Knee extension    -3 -3     Ankle dorsiflexion          Ankle plantarflexion          Ankle inversion          Ankle eversion           (Blank rows = not tested)   LOWER EXTREMITY MMT:   MMT (lb) Right 8/21 Left 8/21 Right  Left   Hip flexion     4 4+  Hip extension        Hip abduction     4 4+  Hip adduction        Hip internal rotation        Hip external rotation        Knee flexion  35.3 38.8  Knee extension (age norm 60lb)   46.0 46.1 46.0 ( from previous visit)  46.1 ( from previous visit)   Ankle dorsiflexion        Ankle plantarflexion        Ankle inversion        Ankle eversion         (Blank rows = not tested)       GAIT: Decreased left hip flexion Forward trunk flexion  Decreased left single leg                                                                                                                                     TREATMENT DATE:   [facility] Adult PT    06/11/24  Bike L4x6 minutes for w/u   Kallie- attempted limited by HS cramping  HS isometric digs x10 B Figure 4 stretch 2x30 seconds B (modified on R)  LAQs 3# 2x10 L  Seated TA set + alternating march x20 Seated TA set + clamshell green TB x12 STS green TB around knees x10 from mat table        Treatment:                                             Date: 06/08/24 Pt seen for aquatic therapy today.  Treatment took place in water 3.5-4.75 ft in depth at the [facility] pool. Temp of water was 91.  Pt entered/exited the pool via stairs independently with bil rail.  - unsupported walking forward/ backward, multiple laps - unsupported side stepping -> with arm add/abdct with rainbow hand floats-> lunge -  forward walking kick ue support RBHB - SLS right 2 x 20s hold->dynamic-ue add/abd x 5 (2 LOB) - STS at bench in water, with forward RUE reach and slow controlled descent, x 12 -SL STS from bench onto floor x 5->onto water step x5 (good challenge -Sitting balance on yellow noodle in 3.6 ft (good challenge)    10/3 Manual:  Trigger point release to posterior knee and quad  Review of self trigger point release to the quad.  Patella ROM  Extension stretching  There-ex:   LAQ 3x10 2lb  SAQ 3x10 2 lb  Hamstring stretch reviewed technique 3 x 20-second hold Quad set 3 x 15  There-act:  2 inch 2x10 step down  4 inch step up 2 x 10 4 inch lateral step up 2 x 10 Treatment:                                             Date: 06/02/24 Pt seen for aquatic therapy today.  Treatment took place in water 3.5-4.75 ft in depth at  the [facility] pool. Temp of water was 91.  Pt entered/exited the pool via stairs independently with bil rail.  - unsupported walking forward/ backward, multiple laps facing lap pool to assist with leveling pelvis - unsupported side stepping -> with arm  add/abdct  -decompression position with noodle wrapped posterior across chest cycling -  forward walking kick -backward amb - heel raises x 10 - squats pushing single yellow hand float under water 2x10    - when dried off: applied reg Rock tape in zigzag pattern to Lt knee incision to assist with scar mobilization and desensitization. Pt instructed in safe tape removal techniques; verbalized understanding.  09/26 Manual:  Trigger point release to posterior knee and quad  Review of self trigger point release to the quad.  Patella ROM  Extension stretching  There-ex:   LAQ 3x10 2lb  SAQ 3x10 2 lb  Hamstring stretch reviewed technique 3 x 20-second hold Quad set 3 x 15   There-act:  2 inch 2x10 step down  4 inch step up        PATIENT EDUCATION:  Education details:   Person educated: Patient Education method: Programmer, multimedia, Facilities manager, Actor cues, Verbal cues Education comprehension: verbalized understanding, returned demonstration, verbal cues required, tactile cues required, and needs further education   HOME EXERCISE PROGRAM: Access Code: [email or web address] URL: [email or web address] Date: 03/22/2024 Prepared by: Harlene Cordon  Exercises - Seated Hamstring Stretch  - 2-3 x daily - 7 x weekly - 2 sets - 5 breaths hold - Seated Heel Slide  - 5 x daily - 7 x weekly - 1 sets - 10 reps - Standing Gluteal Sets  - 5 x daily - 7 x weekly - 1 sets - 10 reps - Standing Weight Shift Side to Side Eyes Closed  - 5 x daily - 7 x weekly - 1 sets - 10 reps - Mini Squat  - 5 x daily - 7 x weekly - 1 sets - 10 reps - Sidelying Hip Abduction  - 3 x daily - 7 x weekly - 3 sets - 10 reps   ASSESSMENT:   CLINICAL IMPRESSION:   Arrived a little late today- we worked on functional strengthening as able and time allowed. Her main remaining concerns are sciatic pain as well as her other knee, her surgical knee is feeling pretty well. She may be a great candidate for PREP  program given need for supervised exercise in context of multiple painful joints/body regions. HS and posterior chain very weak in general, focused on this a bit more today.         Eval:  Patient is a 63 y.o. female 2 weeks s/p L TKA who was seen today for physical therapy evaluation and treatment for decreased range of motion, strength, ability to ambulate, and general functional mobility.  She who presents with expected limitations in all above deficits.  She is using the walker at this time for primary mobility outside the house.  She has expected swelling in her knee.  She would benefit from skilled therapy to return to active lifestyle.    OBJECTIVE IMPAIRMENTS: Abnormal gait, decreased activity tolerance, decreased mobility, difficulty walking, decreased ROM, decreased strength, increased edema, increased muscle spasms, and pain.    ACTIVITY LIMITATIONS: carrying, lifting, bending, standing, squatting, stairs, transfers, bed mobility, bathing, and locomotion level   PARTICIPATION LIMITATIONS: meal prep, cleaning, laundry, driving, shopping, community activity, occupation, and yard work   PERSONAL FACTORS: 1-2 comorbidities: hip OA  are also affecting patient's  functional outcome.    REHAB POTENTIAL: Good   CLINICAL DECISION MAKING: Stable/uncomplicated   EVALUATION COMPLEXITY: Low     GOALS: Goals reviewed with patient? Yes  04/13/2024     SHORT TERM GOALS: Target date:  Patient will increase passive knee flexion to 128 degrees on the left Baseline: Goal status: Achieved 9/11 goal updated to 128   2.  Patient will increase passive left knee extension to full Baseline:  Goal status: Achieved 9/11 3.  Patient will progress off assistive device and ambulate greater than 500 feet without significant antalgic gait Baseline:  Goal status: Still using cane.  Patient reports she does not feel safe off the cane yet 9/11   4.  Patient will be independent with HEP Baseline:   Goal status: Has initial HEP achieved 9/11     LONG TERM GOALS: Target date: POC     Patient will go up and down 12 steps with reciprocal gait pattern without increased pain Baseline:  Goal status: Begin cane training 9/11   2.  Patient will squat pick items up off the ground without increased pain Baseline:  Goal status: Working on squats but not achieved 9/11   3.  Patient will ambulate community distances without pain Baseline:  Goal status: Still using cane and limited distances 9/11   4.  Patient will have complete HEP to promote strengthening Baseline:  Goal status: Continue to progress towards end of HEP 9/11     PLAN:   PT FREQUENCY: 2x/week   PT DURATION: 8 weeks   PLANNED INTERVENTIONS: 97110-Therapeutic exercises, 97530- Therapeutic activity, 97112- Neuromuscular re-education, 97535- Self Care, [id number]- Manual therapy, [id number]- Gait training, [phone]- Aquatic Therapy, [id number]- , [id number]- Ultrasound, Patient/Family education, Stair training, Dry Needling, DME instructions, Cryotherapy, and Moist heat    PLAN FOR NEXT SESSION:  Progressed to functional strengthening such as squats and steps as tolerated. Any updates on possible cortisone shot for her sciatica?   Josette Rough, PT, DPT 06/11/24 1:40 PM        Referring diagnosis? Left TKA  Treatment diagnosis? (if different than referring diagnosis) LEft knee stiffness  Left knee pain  What was this (referring dx) caused by? [x]  Surgery []  Fall []  Ongoing issue []  Arthritis []  Other: ____________  Laterality: []  Rt [x]  Lt []  Both  Check all possible CPT codes:  *CHOOSE 10 OR LESS*    See Planned Interventions listed in the Plan section of the Evaluation.

## 2024-06-15 ENCOUNTER — Encounter (HOSPITAL_BASED_OUTPATIENT_CLINIC_OR_DEPARTMENT_OTHER): Payer: Self-pay | Admitting: Physical Therapy

## 2024-06-15 ENCOUNTER — Ambulatory Visit (HOSPITAL_BASED_OUTPATIENT_CLINIC_OR_DEPARTMENT_OTHER): Admitting: Physical Therapy

## 2024-06-15 DIAGNOSIS — R2689 Other abnormalities of gait and mobility: Secondary | ICD-10-CM

## 2024-06-15 DIAGNOSIS — M6281 Muscle weakness (generalized): Secondary | ICD-10-CM

## 2024-06-15 DIAGNOSIS — G8929 Other chronic pain: Secondary | ICD-10-CM

## 2024-06-15 DIAGNOSIS — M25562 Pain in left knee: Secondary | ICD-10-CM

## 2024-06-15 NOTE — Therapy (Signed)
  Outpatient rehab    OUTPATIENT PHYSICAL THERAPY LOWER EXTREMITY TREATMENT     Patient Name: Latasha [name] MRN: [id number] DOB:09/09/1960, 63 y.o., female Today's Date: 03/14/2024   END OF SESSION:    PT End of Session - 06/15/24 1150     Visit Number 25    Number of Visits 32    Date for Recertification  07/09/24    Authorization Type [employer] m[employer]    Authorization Time Period 05/17/24-07/09/24    PT Start Time 1154    PT Stop Time 1232    PT Time Calculation (min) 38 min    Activity Tolerance Patient tolerated treatment well    Behavior During Therapy [facility] for tasks assessed/performed                      Patient Active Problem List    Diagnosis Date Noted   History of total knee arthroplasty, left 03/01/2024   Tubular adenoma 02/25/2024   Severe obesity (BMI 35.0-39.9) with comorbidity (HCC) 10/12/2023   Pulmonary nodule 08/21/2023   Hx of total hip arthroplasty, right 10/03/2021   De Quervain's tenosynovitis, right 09/06/2020   B12 deficiency 07/13/2020   Family history of colon cancer 12/01/2017   Benign essential hypertension 03/01/2016   Herpes zoster without complication 03/01/2016   BP (high blood pressure) 09/05/2015   Osteoporosis, post-menopausal 09/05/2015   Postsurgical menopause 09/05/2015   Class 1 obesity 09/05/2015   GERD (gastroesophageal reflux disease) 09/05/2015   Endometriosis 09/05/2015   Status post total hysterectomy and bilateral salpingo-oophorectomy 09/05/2015   Primary osteoarthritis of both knees 09/28/2014    PCP: Oneil Pinal MD    REFERRING PROVIDER: Fonda Koyanagi  PA-C  Hooten, Lynwood Idalia Raddle., MD (surgeon)   REFERRING DIAG: Left TKA    THERAPY DIAG:  No diagnosis found.   Rationale for Evaluation and Treatment: Rehabilitation   ONSET DATE: DOS  03/01/2024   SUBJECTIVE:    SUBJECTIVE STATEMENT:  Knee and sciatica pain better today.  Knee pain 0/10; sciatic nerve pain 2/10    Eval: Pt received  PT from 04-02/2024 for bilat knee OA.  She received aquatic therapy  Pt underwent L TKA on 03/01/2024.  At this plan her pain is well-controlled.  She has been at home health and done well.  She did not use the cane some at home.  She practiced using the cane at home with home health.  She is using a walker outside the house.  She has an exercise program that she has been working.  Will review the exercise program today.  Her chief complaint at this time is difficulty sleeping.  She was advised that this is normal at this point.      PERTINENT HISTORY: L TKA on 6/302025 OA R knee and L shoulder. Bilat shoulder pain R THA in 2023 HTN Osteoporosis Anxiety Anemia Chronic fatigue PAIN:  Are you having pain? Yes: NPRS scale: 0/10 Pain location:  Pain description:  Aggravating factors:  Relieving factors:     PRECAUTIONS: None   RED FLAGS: None           WEIGHT BEARING RESTRICTIONS: No   FALLS:  Has patient fallen in last 6 months? No   LIVING ENVIRONMENT: 5 steps into the house   OCCUPATION:  Retired    PLOF: Independent   PATIENT GOALS:  To be able to her mother son dance by October at her sons wedding/ to be able to walk  OBJECTIVE:  Note: Objective measures were completed at Evaluation unless otherwise noted.   DIAGNOSTIC FINDINGS:  Nothing post op    PATIENT SURVEYS:  LEFS  Extreme difficulty/unable (0), Quite a bit of difficulty (1), Moderate difficulty (2), Little difficulty (3), No difficulty (4) Survey date:  eval 8/28  Any of your usual work, housework or school activities  4  2. Usual hobbies, recreational or sporting activities  3  3. Getting into/out of the bath  3  4. Walking between rooms  3  5. Putting on socks/shoes  3  6. Squatting   3  7. Lifting an object, like a bag of groceries from the floor  2  8. Performing light activities around your home  1  9. Performing heavy activities around your home  2  10. Getting into/out of a car  3  11.  Walking 2 blocks  1  12. Walking 1 mile  1  13. Going up/down 10 stairs (1 flight)  1  14. Standing for 1 hour  1  15.  sitting for 1 hour  1  16. Running on even ground  1  17. Running on uneven ground  1  18. Making sharp turns while running fast  1  19. Hopping   1  20. Rolling over in bed  2  Score total:  23/80 38/80      COGNITION: Overall cognitive status: Within functional limits for tasks assessed                               SENSATION:   EDEMA:  Circumferential: 44.5 R  48.7 L    MUSCLE LENGTH:   POSTURE: rounded shoulders and forward head   PALPATION: No unexpected TTP    LOWER EXTREMITY ROM:   Passive ROM Right eval Left eval left left Left  8/11  L  8/18  Hip flexion          Hip extension          Hip abduction          Hip adduction          Hip internal rotation          Hip external rotation          Knee flexion   96  100 103 118  123  Knee extension    -3 -3     Ankle dorsiflexion          Ankle plantarflexion          Ankle inversion          Ankle eversion           (Blank rows = not tested)   LOWER EXTREMITY MMT:   MMT (lb) Right 8/21 Left 8/21 Right  Left   Hip flexion     4 4+  Hip extension        Hip abduction     4 4+  Hip adduction        Hip internal rotation        Hip external rotation        Knee flexion  35.3 38.8     Knee extension (age norm 60lb)   46.0 46.1 46.0 ( from previous visit)  46.1 ( from previous visit)   Ankle dorsiflexion        Ankle plantarflexion        Ankle inversion  Ankle eversion         (Blank rows = not tested)       GAIT: Decreased left hip flexion Forward trunk flexion  Decreased left single leg                                                                                                                                    TREATMENT DATE:   [facility] Adult PT  Treatment:                                             Date: 06/15/24 Pt seen for aquatic therapy today.   Treatment took place in water 3.5-4.75 ft in depth at the [facility] pool. Temp of water was 91.  Pt entered/exited the pool via stairs independently with bil rail.  - unsupported walking forward/ backward, multiple laps - unsupported side stepping -> with arm add/abdct with rainbow hand floats-> lunge -  forward walking kick ue support RBHB - STS at bench in water, with forward RUE reach and slow controlled descent, x 12(easy, no LOB) -SL STS from bench onto floor x 10 (no LOB) -hamstring and gastroc stretching - SLS right 2 x 20s hold->dynamic-ue add/abd x 5 (2 LOB) -Sitting balance on yellow noodle in 3.6 ft (good challenge) -STS from 3rd step x 5-> x 5 with add set using BB   06/11/24  Bike L4x6 minutes for w/u   Kallie- attempted limited by HS cramping  HS isometric digs x10 B Figure 4 stretch 2x30 seconds B (modified on R)  LAQs 3# 2x10 L  Seated TA set + alternating march x20 Seated TA set + clamshell green TB x12 STS green TB around knees x10 from mat table        Treatment:                                             Date: 06/08/24 Pt seen for aquatic therapy today.  Treatment took place in water 3.5-4.75 ft in depth at the [facility] pool. Temp of water was 91.  Pt entered/exited the pool via stairs independently with bil rail.  - unsupported walking forward/ backward, multiple laps - unsupported side stepping -> with arm add/abdct with rainbow hand floats-> lunge -  forward walking kick ue support RBHB - SLS right 2 x 20s hold->dynamic-ue add/abd x 5 (2 LOB) - STS at bench in water, with forward RUE reach and slow controlled descent, x 12 -SL STS from bench onto floor x 5->onto water step x5 (good challenge -Sitting balance on yellow noodle in 3.6 ft (good challenge)    10/3 Manual:  Trigger point release to posterior knee and  quad  Review of self trigger point release to the quad.  Patella ROM  Extension stretching  There-ex:   LAQ  3x10 2lb  SAQ 3x10 2 lb  Hamstring stretch reviewed technique 3 x 20-second hold Quad set 3 x 15  There-act:  2 inch 2x10 step down  4 inch step up 2 x 10 4 inch lateral step up 2 x 10 Treatment:                                             Date: 06/02/24 Pt seen for aquatic therapy today.  Treatment took place in water 3.5-4.75 ft in depth at the [facility] pool. Temp of water was 91.  Pt entered/exited the pool via stairs independently with bil rail.  - unsupported walking forward/ backward, multiple laps facing lap pool to assist with leveling pelvis - unsupported side stepping -> with arm add/abdct  -decompression position with noodle wrapped posterior across chest cycling -  forward walking kick -backward amb - heel raises x 10 - squats pushing single yellow hand float under water 2x10    - when dried off: applied reg Rock tape in zigzag pattern to Lt knee incision to assist with scar mobilization and desensitization. Pt instructed in safe tape removal techniques; verbalized understanding.  09/26 Manual:  Trigger point release to posterior knee and quad  Review of self trigger point release to the quad.  Patella ROM  Extension stretching  There-ex:   LAQ 3x10 2lb  SAQ 3x10 2 lb  Hamstring stretch reviewed technique 3 x 20-second hold Quad set 3 x 15   There-act:  2 inch 2x10 step down  4 inch step up        PATIENT EDUCATION:  Education details:   Person educated: Patient Education method: Programmer, multimedia, Facilities manager, Actor cues, Verbal cues Education comprehension: verbalized understanding, returned demonstration, verbal cues required, tactile cues required, and needs further education   HOME EXERCISE PROGRAM: Access Code: [email or web address] URL: [email or web address] Date: 03/22/2024 Prepared by: Harlene Cordon  Exercises - Seated Hamstring Stretch  - 2-3 x daily - 7 x weekly - 2 sets - 5 breaths hold - Seated Heel Slide  - 5 x daily  - 7 x weekly - 1 sets - 10 reps - Standing Gluteal Sets  - 5 x daily - 7 x weekly - 1 sets - 10 reps - Standing Weight Shift Side to Side Eyes Closed  - 5 x daily - 7 x weekly - 1 sets - 10 reps - Mini Squat  - 5 x daily - 7 x weekly - 1 sets - 10 reps - Sidelying Hip Abduction  - 3 x daily - 7 x weekly - 3 sets - 10 reps   ASSESSMENT:   CLINICAL IMPRESSION: Lle strength improving as evidenced by completing SL STS on left x 10 consecutive times without LOB off of ebnch onto pool floor. She was further challenged with maintaining add set using 11 LB buoyancy ball and completing STS from 3rd step (lowly seated) successfully x 5.  Goals ongoing   Eval:  Patient is a 63 y.o. female 2 weeks s/p L TKA who was seen today for physical therapy evaluation and treatment for decreased range of motion, strength, ability to ambulate, and general functional mobility.  She who presents with expected limitations in all above deficits.  She is using the  walker at this time for primary mobility outside the house.  She has expected swelling in her knee.  She would benefit from skilled therapy to return to active lifestyle.    OBJECTIVE IMPAIRMENTS: Abnormal gait, decreased activity tolerance, decreased mobility, difficulty walking, decreased ROM, decreased strength, increased edema, increased muscle spasms, and pain.    ACTIVITY LIMITATIONS: carrying, lifting, bending, standing, squatting, stairs, transfers, bed mobility, bathing, and locomotion level   PARTICIPATION LIMITATIONS: meal prep, cleaning, laundry, driving, shopping, community activity, occupation, and yard work   PERSONAL FACTORS: 1-2 comorbidities: hip OA  are also affecting patient's functional outcome.    REHAB POTENTIAL: Good   CLINICAL DECISION MAKING: Stable/uncomplicated   EVALUATION COMPLEXITY: Low     GOALS: Goals reviewed with patient? Yes  04/13/2024     SHORT TERM GOALS: Target date:  Patient will increase passive knee  flexion to 128 degrees on the left Baseline: Goal status: Achieved 9/11 goal updated to 128   2.  Patient will increase passive left knee extension to full Baseline:  Goal status: Achieved 9/11 3.  Patient will progress off assistive device and ambulate greater than 500 feet without significant antalgic gait Baseline:  Goal status: Still using cane.  Patient reports she does not feel safe off the cane yet 9/11   4.  Patient will be independent with HEP Baseline:  Goal status: Has initial HEP achieved 9/11     LONG TERM GOALS: Target date: POC     Patient will go up and down 12 steps with reciprocal gait pattern without increased pain Baseline:  Goal status: Begin cane training 9/11   2.  Patient will squat pick items up off the ground without increased pain Baseline:  Goal status: Working on squats but not achieved 9/11   3.  Patient will ambulate community distances without pain Baseline:  Goal status: Still using cane and limited distances 9/11   4.  Patient will have complete HEP to promote strengthening Baseline:  Goal status: Continue to progress towards end of HEP 9/11     PLAN:   PT FREQUENCY: 2x/week   PT DURATION: 8 weeks   PLANNED INTERVENTIONS: 97110-Therapeutic exercises, 97530- Therapeutic activity, 97112- Neuromuscular re-education, 97535- Self Care, [id number]- Manual therapy, [id number]- Gait training, [phone]- Aquatic Therapy, [id number]- , [id number]- Ultrasound, Patient/Family education, Stair training, Dry Needling, DME instructions, Cryotherapy, and Moist heat    PLAN FOR NEXT SESSION:  Progressed to functional strengthening such as squats and steps as tolerated. Any updates on possible cortisone shot for her sciatica?   Ronal Lisbon) Sya Nestler MPT 06/15/24 12:00 PM [facility] [address] [place], [place], [address] Phone: [phone]   Fax:  [phone]         Referring diagnosis? Left TKA  Treatment  diagnosis? (if different than referring diagnosis) LEft knee stiffness  Left knee pain  What was this (referring dx) caused by? [x]  Surgery []  Fall []  Ongoing issue []  Arthritis []  Other: ____________  Laterality: []  Rt [x]  Lt []  Both  Check all possible CPT codes:  *CHOOSE 10 OR LESS*    See Planned Interventions listed in the Plan section of the Evaluation.

## 2024-06-18 ENCOUNTER — Ambulatory Visit (HOSPITAL_BASED_OUTPATIENT_CLINIC_OR_DEPARTMENT_OTHER): Admitting: Physical Therapy

## 2024-06-18 ENCOUNTER — Encounter (HOSPITAL_BASED_OUTPATIENT_CLINIC_OR_DEPARTMENT_OTHER): Payer: Self-pay | Admitting: Physical Therapy

## 2024-06-18 DIAGNOSIS — M6281 Muscle weakness (generalized): Secondary | ICD-10-CM

## 2024-06-18 DIAGNOSIS — R2689 Other abnormalities of gait and mobility: Secondary | ICD-10-CM | POA: Diagnosis not present

## 2024-06-18 DIAGNOSIS — M25562 Pain in left knee: Secondary | ICD-10-CM

## 2024-06-18 NOTE — Therapy (Signed)
 [facility]    OUTPATIENT PHYSICAL THERAPY LOWER EXTREMITY TREATMENT     Patient Name: Latasha [name] MRN: [id number] DOB:[birth year], 63 y.o., female Today's Date: 03/14/2024   END OF SESSION:    PT End of Session - 06/18/24 1314     Visit Number 26    Number of Visits 32    Date for Recertification  07/09/24    Authorization Type [employer] mcr Progress note to be done at 26    PT Start Time 1300    PT Stop Time 1343    PT Time Calculation (min) 43 min    Activity Tolerance Patient tolerated treatment well    Behavior During Therapy [facility] for tasks assessed/performed               Progress Note Reporting Period 05/13/2024 to 10/17  See note below for Objective Data and Assessment of Progress/Goals.           Patient Active Problem List    Diagnosis Date Noted   History of total knee arthroplasty, left 03/01/2024   Tubular adenoma 02/25/2024   Severe obesity (BMI 35.0-39.9) with comorbidity (HCC) 10/12/2023   Pulmonary nodule 08/21/2023   Hx of total hip arthroplasty, right 10/03/2021   De Quervain's tenosynovitis, right 09/06/2020   B12 deficiency 07/13/2020   Family history of colon cancer 12/01/2017   Benign essential hypertension 03/01/2016   Herpes zoster without complication 03/01/2016   BP (high blood pressure) 09/05/2015   Osteoporosis, post-menopausal 09/05/2015   Postsurgical menopause 09/05/2015   Class 1 obesity 09/05/2015   GERD (gastroesophageal reflux disease) 09/05/2015   Endometriosis 09/05/2015   Status post total hysterectomy and bilateral salpingo-oophorectomy 09/05/2015   Primary osteoarthritis of both knees 09/28/2014    PCP: Oneil Pinal MD    REFERRING PROVIDER: Fonda Koyanagi  PA-C  Hooten, Lynwood Idalia Raddle., MD (surgeon)   REFERRING DIAG: Left TKA    THERAPY DIAG:  No diagnosis found.   Rationale for Evaluation and Treatment: Rehabilitation   ONSET DATE: DOS  03/01/2024   SUBJECTIVE:    SUBJECTIVE  STATEMENT:The patient has just come from the MD's. She had a cortisone shot in her back. Therapy will focus on manual therapy and light quad strengthening.     Eval: Pt received PT from 04-02/2024 for bilat knee OA.  She received aquatic therapy  Pt underwent L TKA on 03/01/2024.  At this plan her pain is well-controlled.  She has been at home health and done well.  She did not use the cane some at home.  She practiced using the cane at home with home health.  She is using a walker outside the house.  She has an exercise program that she has been working.  Will review the exercise program today.  Her chief complaint at this time is difficulty sleeping.  She was advised that this is normal at this point.      PERTINENT HISTORY: L TKA on 6/302025 OA R knee and L shoulder. Bilat shoulder pain R THA in 2023 HTN Osteoporosis Anxiety Anemia Chronic fatigue PAIN:  Are you having pain? Yes: NPRS scale: 0/10 Pain location:  Pain description:  Aggravating factors:  Relieving factors:     PRECAUTIONS: None   RED FLAGS: None           WEIGHT BEARING RESTRICTIONS: No   FALLS:  Has patient fallen in last 6 months? No   LIVING ENVIRONMENT: 5 steps into the house   OCCUPATION:  Retired    PLOF: Independent   PATIENT GOALS:  To be able to her mother son dance by October at her sons wedding/ to be able to walk      OBJECTIVE:  Note: Objective measures were completed at Evaluation unless otherwise noted.   DIAGNOSTIC FINDINGS:  Nothing post op    PATIENT SURVEYS:  LEFS  Extreme difficulty/unable (0), Quite a bit of difficulty (1), Moderate difficulty (2), Little difficulty (3), No difficulty (4) Survey date:  eval 8/28  Any of your usual work, housework or school activities  4  2. Usual hobbies, recreational or sporting activities  3  3. Getting into/out of the bath  3  4. Walking between rooms  3  5. Putting on socks/shoes  3  6. Squatting   3  7. Lifting an object, like a  bag of groceries from the floor  2  8. Performing light activities around your home  1  9. Performing heavy activities around your home  2  10. Getting into/out of a car  3  11. Walking 2 blocks  1  12. Walking 1 mile  1  13. Going up/down 10 stairs (1 flight)  1  14. Standing for 1 hour  1  15.  sitting for 1 hour  1  16. Running on even ground  1  17. Running on uneven ground  1  18. Making sharp turns while running fast  1  19. Hopping   1  20. Rolling over in bed  2  Score total:  23/80 38/80      COGNITION: Overall cognitive status: Within functional limits for tasks assessed                               SENSATION:   EDEMA:  Circumferential: 44.5 R  48.7 L    MUSCLE LENGTH:   POSTURE: rounded shoulders and forward head   PALPATION: No unexpected TTP    LOWER EXTREMITY ROM:   Passive ROM Right eval Left eval left left Left  8/11  L  8/18 Left  10/17  Hip flexion           Hip extension           Hip abduction           Hip adduction           Hip internal rotation           Hip external rotation           Knee flexion   96  100 103 118  123   Knee extension    -3 -3      Ankle dorsiflexion           Ankle plantarflexion           Ankle inversion           Ankle eversion            (Blank rows = not tested)   LOWER EXTREMITY MMT:   MMT (lb) Right 8/21 Left 8/21 Right  Left  Left   Hip flexion     4 4+ 4+  Hip extension         Hip abduction     4 4+ 4+  Hip adduction         Hip internal rotation         Hip external rotation  Knee flexion  35.3 38.8      Knee extension (age norm 60lb)   46.0 46.1 46.0 ( from previous visit)  46.1 ( from previous visit)    Ankle dorsiflexion         Ankle plantarflexion         Ankle inversion         Ankle eversion          (Blank rows = not tested)       GAIT: Decreased left hip flexion Forward trunk flexion  Decreased left single leg                                                                                                                                     TREATMENT DATE:   [facility] Adult PT  Treatment:                                              10/17 Manual: Scar tissue release to scar Trigger pint release to lateral knee PROM into extension with distraction   There-ex:  PROM into flexion  Quad set 3x12  SAQ 1.5 lbs 3x12  LAQ 3x12 1.5 lbs   Strength and ROM measurements and review of goals   Date: 06/15/24 Pt seen for aquatic therapy today.  Treatment took place in water 3.5-4.75 ft in depth at the [facility] pool. Temp of water was 91.  Pt entered/exited the pool via stairs independently with bil rail.  - unsupported walking forward/ backward, multiple laps - unsupported side stepping -> with arm add/abdct with rainbow hand floats-> lunge -  forward walking kick ue support RBHB - STS at bench in water, with forward RUE reach and slow controlled descent, x 12(easy, no LOB) -SL STS from bench onto floor x 10 (no LOB) -hamstring and gastroc stretching - SLS right 2 x 20s hold->dynamic-ue add/abd x 5 (2 LOB) -Sitting balance on yellow noodle in 3.6 ft (good challenge) -STS from 3rd step x 5-> x 5 with add set using BB   06/11/24  Bike L4x6 minutes for w/u   Kallie- attempted limited by HS cramping  HS isometric digs x10 B Figure 4 stretch 2x30 seconds B (modified on R)  LAQs 3# 2x10 L  Seated TA set + alternating march x20 Seated TA set + clamshell green TB x12 STS green TB around knees x10 from mat table        Treatment:                                             Date: 06/08/24 Pt seen for aquatic therapy today.  Treatment took place in water 3.5-4.75 ft  in depth at the [facility] pool. Temp of water was 91.  Pt entered/exited the pool via stairs independently with bil rail.  - unsupported walking forward/ backward, multiple laps - unsupported side stepping -> with arm add/abdct with rainbow hand floats-> lunge -   forward walking kick ue support RBHB - SLS right 2 x 20s hold->dynamic-ue add/abd x 5 (2 LOB) - STS at bench in water, with forward RUE reach and slow controlled descent, x 12 -SL STS from bench onto floor x 5->onto water step x5 (good challenge -Sitting balance on yellow noodle in 3.6 ft (good challenge)    10/3 Manual:  Trigger point release to posterior knee and quad  Review of self trigger point release to the quad.  Patella ROM  Extension stretching  There-ex:   LAQ 3x10 2lb  SAQ 3x10 2 lb  Hamstring stretch reviewed technique 3 x 20-second hold Quad set 3 x 15  There-act:  2 inch 2x10 step down  4 inch step up 2 x 10 4 inch lateral step up 2 x 10 Treatment:                                             Date: 06/02/24 Pt seen for aquatic therapy today.  Treatment took place in water 3.5-4.75 ft in depth at the [facility] pool. Temp of water was 91.  Pt entered/exited the pool via stairs independently with bil rail.  - unsupported walking forward/ backward, multiple laps facing lap pool to assist with leveling pelvis - unsupported side stepping -> with arm add/abdct  -decompression position with noodle wrapped posterior across chest cycling -  forward walking kick -backward amb - heel raises x 10 - squats pushing single yellow hand float under water 2x10    - when dried off: applied reg Rock tape in zigzag pattern to Lt knee incision to assist with scar mobilization and desensitization. Pt instructed in safe tape removal techniques; verbalized understanding.  09/26 Manual:  Trigger point release to posterior knee and quad  Review of self trigger point release to the quad.  Patella ROM  Extension stretching  There-ex:   LAQ 3x10 2lb  SAQ 3x10 2 lb  Hamstring stretch reviewed technique 3 x 20-second hold Quad set 3 x 15   There-act:  2 inch 2x10 step down  4 inch step up        PATIENT EDUCATION:  Education details:   Person educated:  Patient Education method: Programmer, multimedia, Facilities manager, Actor cues, Verbal cues Education comprehension: verbalized understanding, returned demonstration, verbal cues required, tactile cues required, and needs further education   HOME EXERCISE PROGRAM: Access Code: [email or web address] URL: [email or web address] Date: 03/22/2024 Prepared by: Harlene Cordon  Exercises - Seated Hamstring Stretch  - 2-3 x daily - 7 x weekly - 2 sets - 5 breaths hold - Seated Heel Slide  - 5 x daily - 7 x weekly - 1 sets - 10 reps - Standing Gluteal Sets  - 5 x daily - 7 x weekly - 1 sets - 10 reps - Standing Weight Shift Side to Side Eyes Closed  - 5 x daily - 7 x weekly - 1 sets - 10 reps - Mini Squat  - 5 x daily - 7 x weekly - 1 sets - 10 reps - Sidelying Hip Abduction  - 3 x daily - 7 x weekly -  3 sets - 10 reps   ASSESSMENT:   CLINICAL IMPRESSION: The patient is making good progress with her knee. She is having very little pain overall in her knee she has been very limited by her sciatic pain> She had an injection today. We deferred strength testing this visit  2nd to recent injection. We worked on knee specific exercises. We deferred exercises that involved the back 2nd to shot. We will resume full exercise program next visit. She had full ROM today. Therapy assessed her gait She has her cane but its not using it much. Therapy will continue to advance functional exercises. We have been working on progressing her ability to step up and squat but it has been somewhat limited by her sciatic pain.   Eval:  Patient is a 63 y.o. female 2 weeks s/p L TKA who was seen today for physical therapy evaluation and treatment for decreased range of motion, strength, ability to ambulate, and general functional mobility.  She who presents with expected limitations in all above deficits.  She is using the walker at this time for primary mobility outside the house.  She has expected swelling in her knee.  She would  benefit from skilled therapy to return to active lifestyle.    OBJECTIVE IMPAIRMENTS: Abnormal gait, decreased activity tolerance, decreased mobility, difficulty walking, decreased ROM, decreased strength, increased edema, increased muscle spasms, and pain.    ACTIVITY LIMITATIONS: carrying, lifting, bending, standing, squatting, stairs, transfers, bed mobility, bathing, and locomotion level   PARTICIPATION LIMITATIONS: meal prep, cleaning, laundry, driving, shopping, community activity, occupation, and yard work   PERSONAL FACTORS: 1-2 comorbidities: hip OA  are also affecting patient's functional outcome.    REHAB POTENTIAL: Good   CLINICAL DECISION MAKING: Stable/uncomplicated   EVALUATION COMPLEXITY: Low     GOALS: Goals reviewed with patient? Yes  04/13/2024     SHORT TERM GOALS: Target date:  Patient will increase passive knee flexion to 128 degrees on the left Baseline: Goal status: Achieved 9/11 goal updated to 128   2.  Patient will increase passive left knee extension to full Baseline:  Goal status: Achieved 9/11 3.  Patient will progress off assistive device and ambulate greater than 500 feet without significant antalgic gait Baseline:  Goal status: Still using cane.  Patient reports she does not feel safe off the cane yet 9/11   4.  Patient will be independent with HEP Baseline:  Goal status: Has initial HEP achieved 9/11     LONG TERM GOALS: Target date: POC     Patient will go up and down 12 steps with reciprocal gait pattern without increased pain Baseline:  Goal status: Begin cane training 9/11   2.  Patient will squat pick items up off the ground without increased pain Baseline:  Goal status: Working on squats but not achieved 10/17 2nd to sciatica    3.  Patient will ambulate community distances without pain Baseline:  Goal status: Still using cane somewhat 10/17    4.  Patient will have complete HEP to promote strengthening Baseline:  Goal  status: Continue to progress towards end of HEP  for knee 10/17      PLAN:   PT FREQUENCY: 2x/week   PT DURATION: 8 weeks   PLANNED INTERVENTIONS: 97110-Therapeutic exercises, 97530- Therapeutic activity, 97112- Neuromuscular re-education, 97535- Self Care, [id number]- Manual therapy, [id number]- Gait training, [phone]- Aquatic Therapy, [id number]- , [id number]- Ultrasound, Patient/Family education, Stair training, Dry Needling, DME instructions, Cryotherapy, and Moist heat  PLAN FOR NEXT SESSION:  Progressed to functional strengthening such as squats and steps as tolerated. Any updates on possible cortisone shot for her sciatica?   Alm Don PT DPT 06/18/24 1:28 PM [facility] [address] [place], [place], [address] Phone: [phone]   Fax:  [phone]         Referring diagnosis? Left TKA  Treatment diagnosis? (if different than referring diagnosis) LEft knee stiffness  Left knee pain  What was this (referring dx) caused by? [x]  Surgery []  Fall []  Ongoing issue []  Arthritis []  Other: ____________  Laterality: []  Rt [x]  Lt []  Both  Check all possible CPT codes:  *CHOOSE 10 OR LESS*    See Planned Interventions listed in the Plan section of the Evaluation.

## 2024-06-22 ENCOUNTER — Ambulatory Visit (HOSPITAL_BASED_OUTPATIENT_CLINIC_OR_DEPARTMENT_OTHER): Admitting: Physical Therapy

## 2024-06-23 ENCOUNTER — Ambulatory Visit (HOSPITAL_BASED_OUTPATIENT_CLINIC_OR_DEPARTMENT_OTHER): Admitting: Physical Therapy

## 2024-06-23 ENCOUNTER — Encounter (HOSPITAL_BASED_OUTPATIENT_CLINIC_OR_DEPARTMENT_OTHER): Payer: Self-pay | Admitting: Physical Therapy

## 2024-06-23 DIAGNOSIS — R2689 Other abnormalities of gait and mobility: Secondary | ICD-10-CM | POA: Diagnosis not present

## 2024-06-23 DIAGNOSIS — M25562 Pain in left knee: Secondary | ICD-10-CM

## 2024-06-23 DIAGNOSIS — G8929 Other chronic pain: Secondary | ICD-10-CM

## 2024-06-23 DIAGNOSIS — M6281 Muscle weakness (generalized): Secondary | ICD-10-CM

## 2024-06-23 NOTE — Therapy (Signed)
 [facility]    OUTPATIENT PHYSICAL THERAPY LOWER EXTREMITY TREATMENT     Patient Name: Latasha [name] MRN: [id number] DOB:[birth year], 63 y.o., female Today's Date: 03/14/2024   END OF SESSION:                Patient Active Problem List    Diagnosis Date Noted   History of total knee arthroplasty, left 03/01/2024   Tubular adenoma 02/25/2024   Severe obesity (BMI 35.0-39.9) with comorbidity (HCC) 10/12/2023   Pulmonary nodule 08/21/2023   Hx of total hip arthroplasty, right 10/03/2021   De Quervain's tenosynovitis, right 09/06/2020   B12 deficiency 07/13/2020   Family history of colon cancer 12/01/2017   Benign essential hypertension 03/01/2016   Herpes zoster without complication 03/01/2016   BP (high blood pressure) 09/05/2015   Osteoporosis, post-menopausal 09/05/2015   Postsurgical menopause 09/05/2015   Class 1 obesity 09/05/2015   GERD (gastroesophageal reflux disease) 09/05/2015   Endometriosis 09/05/2015   Status post total hysterectomy and bilateral salpingo-oophorectomy 09/05/2015   Primary osteoarthritis of both knees 09/28/2014    PCP: Oneil Pinal MD    REFERRING PROVIDER: Fonda Koyanagi  PA-C  Hooten, Lynwood Idalia Raddle., MD (surgeon)   REFERRING DIAG: Left TKA    THERAPY DIAG:  No diagnosis found.   Rationale for Evaluation and Treatment: Rehabilitation   ONSET DATE: DOS  03/01/2024   SUBJECTIVE:    SUBJECTIVE STATEMENT: Pt reports Cortizone shot helped some. Has not had any sciatic pain yet today. Current pain 2/10   Eval: Pt received PT from 04-02/2024 for bilat knee OA.  She received aquatic therapy  Pt underwent L TKA on 03/01/2024.  At this plan her pain is well-controlled.  She has been at home health and done well.  She did not use the cane some at home.  She practiced using the cane at home with home health.  She is using a walker outside the house.  She has an exercise program that she has been working.  Will review the  exercise program today.  Her chief complaint at this time is difficulty sleeping.  She was advised that this is normal at this point.      PERTINENT HISTORY: L TKA on 6/302025 OA R knee and L shoulder. Bilat shoulder pain R THA in 2023 HTN Osteoporosis Anxiety Anemia Chronic fatigue PAIN:  Are you having pain? Yes: NPRS scale: 0/10 Pain location:  Pain description:  Aggravating factors:  Relieving factors:     PRECAUTIONS: None   RED FLAGS: None           WEIGHT BEARING RESTRICTIONS: No   FALLS:  Has patient fallen in last 6 months? No   LIVING ENVIRONMENT: 5 steps into the house   OCCUPATION:  Retired    PLOF: Independent   PATIENT GOALS:  To be able to her mother son dance by October at her sons wedding/ to be able to walk      OBJECTIVE:  Note: Objective measures were completed at Evaluation unless otherwise noted.   DIAGNOSTIC FINDINGS:  Nothing post op    PATIENT SURVEYS:  LEFS  Extreme difficulty/unable (0), Quite a bit of difficulty (1), Moderate difficulty (2), Little difficulty (3), No difficulty (4) Survey date:  eval 8/28  Any of your usual work, housework or school activities  4  2. Usual hobbies, recreational or sporting activities  3  3. Getting into/out of the bath  3  4. Walking between rooms  3  5. Putting on socks/shoes  3  6. Squatting   3  7. Lifting an object, like a bag of groceries from the floor  2  8. Performing light activities around your home  1  9. Performing heavy activities around your home  2  10. Getting into/out of a car  3  11. Walking 2 blocks  1  12. Walking 1 mile  1  13. Going up/down 10 stairs (1 flight)  1  14. Standing for 1 hour  1  15.  sitting for 1 hour  1  16. Running on even ground  1  17. Running on uneven ground  1  18. Making sharp turns while running fast  1  19. Hopping   1  20. Rolling over in bed  2  Score total:  23/80 38/80      COGNITION: Overall cognitive status: Within functional  limits for tasks assessed                               SENSATION:   EDEMA:  Circumferential: 44.5 R  48.7 L    MUSCLE LENGTH:   POSTURE: rounded shoulders and forward head   PALPATION: No unexpected TTP    LOWER EXTREMITY ROM:   Passive ROM Right eval Left eval left left Left  8/11  L  8/18 Left  10/17  Hip flexion           Hip extension           Hip abduction           Hip adduction           Hip internal rotation           Hip external rotation           Knee flexion   96  100 103 118  123   Knee extension    -3 -3      Ankle dorsiflexion           Ankle plantarflexion           Ankle inversion           Ankle eversion            (Blank rows = not tested)   LOWER EXTREMITY MMT:   MMT (lb) Right 8/21 Left 8/21 Right  Left  Left   Hip flexion     4 4+ 4+  Hip extension         Hip abduction     4 4+ 4+  Hip adduction         Hip internal rotation         Hip external rotation         Knee flexion  35.3 38.8      Knee extension (age norm 60lb)   46.0 46.1 46.0 ( from previous visit)  46.1 ( from previous visit)    Ankle dorsiflexion         Ankle plantarflexion         Ankle inversion         Ankle eversion          (Blank rows = not tested)       GAIT: Decreased left hip flexion Forward trunk flexion  Decreased left single leg  TREATMENT DATE:   [facility] Adult PT  Treatment:     Date: 06/23/24 Pt seen for aquatic therapy today.  Treatment took place in water 3.5-4.75 ft in depth at the [facility] pool. Temp of water was 91.  Pt entered/exited the pool via stairs independently with bil rail.  - unsupported walking forward/ backward, multiple laps - unsupported side stepping -> with arm add/abdct with rainbow hand floats-> lunge -  forward walking kick ue support RBHB -knee flex/ext CKC straddled on  hollow noodle-> walking across 3.6 ft in same position forward and back - STS from 3rd step unsupported x 10. Good execution.->from 4th from bottom step 2 x 5 (completed well, good challenge) -hamstring and gastroc stretching -Sitting balance on yellow noodle in 3.6 ft (good challenge) lifting alternating ue.   10/17 Manual: Scar tissue release to scar Trigger pint release to lateral knee PROM into extension with distraction   There-ex:  PROM into flexion  Quad set 3x12  SAQ 1.5 lbs 3x12  LAQ 3x12 1.5 lbs   Strength and ROM measurements and review of goals   Date: 06/15/24 Pt seen for aquatic therapy today.  Treatment took place in water 3.5-4.75 ft in depth at the [facility] pool. Temp of water was 91.  Pt entered/exited the pool via stairs independently with bil rail.  - unsupported walking forward/ backward, multiple laps - unsupported side stepping -> with arm add/abdct with rainbow hand floats-> lunge -  forward walking kick ue support RBHB - STS at bench in water, with forward RUE reach and slow controlled descent, x 12(easy, no LOB) -SL STS from bench onto floor x 10 (no LOB) -hamstring and gastroc stretching - SLS right 2 x 20s hold->dynamic-ue add/abd x 5 (2 LOB) -Sitting balance on yellow noodle in 3.6 ft (good challenge) -STS from 3rd step x 5-> x 5 with add set using BB   06/11/24  Bike L4x6 minutes for w/u   Kallie- attempted limited by HS cramping  HS isometric digs x10 B Figure 4 stretch 2x30 seconds B (modified on R)  LAQs 3# 2x10 L  Seated TA set + alternating march x20 Seated TA set + clamshell green TB x12 STS green TB around knees x10 from mat table        Treatment:                                             Date: 06/08/24 Pt seen for aquatic therapy today.  Treatment took place in water 3.5-4.75 ft in depth at the [facility] pool. Temp of water was 91.  Pt entered/exited the pool via stairs independently with bil  rail.  - unsupported walking forward/ backward, multiple laps - unsupported side stepping -> with arm add/abdct with rainbow hand floats-> lunge -  forward walking kick ue support RBHB - SLS right 2 x 20s hold->dynamic-ue add/abd x 5 (2 LOB) - STS at bench in water, with forward RUE reach and slow controlled descent, x 12 -SL STS from bench onto floor x 5->onto water step x5 (good challenge -Sitting balance on yellow noodle in 3.6 ft (good challenge)    10/3 Manual:  Trigger point release to posterior knee and quad  Review of self trigger point release to the quad.  Patella ROM  Extension stretching  There-ex:   LAQ 3x10 2lb  SAQ 3x10 2  lb  Hamstring stretch reviewed technique 3 x 20-second hold Quad set 3 x 15  There-act:  2 inch 2x10 step down  4 inch step up 2 x 10 4 inch lateral step up 2 x 10 Treatment:                                             Date: 06/02/24 Pt seen for aquatic therapy today.  Treatment took place in water 3.5-4.75 ft in depth at the [facility] pool. Temp of water was 91.  Pt entered/exited the pool via stairs independently with bil rail.  - unsupported walking forward/ backward, multiple laps facing lap pool to assist with leveling pelvis - unsupported side stepping -> with arm add/abdct  -decompression position with noodle wrapped posterior across chest cycling -  forward walking kick -backward amb - heel raises x 10 - squats pushing single yellow hand float under water 2x10    - when dried off: applied reg Rock tape in zigzag pattern to Lt knee incision to assist with scar mobilization and desensitization. Pt instructed in safe tape removal techniques; verbalized understanding.  09/26 Manual:  Trigger point release to posterior knee and quad  Review of self trigger point release to the quad.  Patella ROM  Extension stretching  There-ex:   LAQ 3x10 2lb  SAQ 3x10 2 lb  Hamstring stretch reviewed technique 3 x 20-second  hold Quad set 3 x 15   There-act:  2 inch 2x10 step down  4 inch step up        PATIENT EDUCATION:  Education details:   Person educated: Patient Education method: Programmer, multimedia, Facilities manager, Actor cues, Verbal cues Education comprehension: verbalized understanding, returned demonstration, verbal cues required, tactile cues required, and needs further education   HOME EXERCISE PROGRAM: Access Code: [email or web address] URL: [email or web address] Date: 03/22/2024 Prepared by: Harlene Cordon  Exercises - Seated Hamstring Stretch  - 2-3 x daily - 7 x weekly - 2 sets - 5 breaths hold - Seated Heel Slide  - 5 x daily - 7 x weekly - 1 sets - 10 reps - Standing Gluteal Sets  - 5 x daily - 7 x weekly - 1 sets - 10 reps - Standing Weight Shift Side to Side Eyes Closed  - 5 x daily - 7 x weekly - 1 sets - 10 reps - Mini Squat  - 5 x daily - 7 x weekly - 1 sets - 10 reps - Sidelying Hip Abduction  - 3 x daily - 7 x weekly - 3 sets - 10 reps   ASSESSMENT:   CLINICAL IMPRESSION: Worked squatting mechanics and strengthening (STS seated on 3rd then 4th step from bottom) reducing submersion with good execution and toleration. She is challenged with stool scoot on noodle reporting LB and HS engagement and stretching. Provided vc and demonstration with good understanding. Reduced sciatic nerve discomfort. Overall pain reduced post session to 0/10. Progressing nicely towards goal.       OBJECTIVE IMPAIRMENTS: Abnormal gait, decreased activity tolerance, decreased mobility, difficulty walking, decreased ROM, decreased strength, increased edema, increased muscle spasms, and pain.    ACTIVITY LIMITATIONS: carrying, lifting, bending, standing, squatting, stairs, transfers, bed mobility, bathing, and locomotion level   PARTICIPATION LIMITATIONS: meal prep, cleaning, laundry, driving, shopping, community activity, occupation, and yard work   PERSONAL FACTORS: 1-2 comorbidities: hip OA   are also affecting  patient's functional outcome.    REHAB POTENTIAL: Good   CLINICAL DECISION MAKING: Stable/uncomplicated   EVALUATION COMPLEXITY: Low     GOALS: Goals reviewed with patient? Yes  04/13/2024     SHORT TERM GOALS: Target date:  Patient will increase passive knee flexion to 128 degrees on the left Baseline: Goal status: Achieved 9/11 goal updated to 128   2.  Patient will increase passive left knee extension to full Baseline:  Goal status: Achieved 9/11 3.  Patient will progress off assistive device and ambulate greater than 500 feet without significant antalgic gait Baseline:  Goal status: Still using cane.  Patient reports she does not feel safe off the cane yet 9/11   4.  Patient will be independent with HEP Baseline:  Goal status: Has initial HEP achieved 9/11     LONG TERM GOALS: Target date: POC     Patient will go up and down 12 steps with reciprocal gait pattern without increased pain Baseline:  Goal status: Begin cane training 9/11   2.  Patient will squat pick items up off the ground without increased pain Baseline:  Goal status: Working on squats but not achieved 10/17 2nd to sciatica    3.  Patient will ambulate community distances without pain Baseline:  Goal status: Still using cane somewhat 10/17    4.  Patient will have complete HEP to promote strengthening Baseline:  Goal status: Continue to progress towards end of HEP  for knee 10/17      PLAN:   PT FREQUENCY: 2x/week   PT DURATION: 8 weeks   PLANNED INTERVENTIONS: 97110-Therapeutic exercises, 97530- Therapeutic activity, 97112- Neuromuscular re-education, 97535- Self Care, [id number]- Manual therapy, [id number]- Gait training, [phone]- Aquatic Therapy, [id number]- , [id number]- Ultrasound, Patient/Family education, Stair training, Dry Needling, DME instructions, Cryotherapy, and Moist heat    PLAN FOR NEXT SESSION:  Progressed to functional strengthening such as squats and steps as  tolerated. Any updates on possible cortisone shot for her sciatica?   Ronal [place]) Angellica Maddison MPT 06/23/24 12:46 PM [facility] [address] [place], [place], [address] Phone: [phone]   Fax:  [phone]          Referring diagnosis? Left TKA  Treatment diagnosis? (if different than referring diagnosis) LEft knee stiffness  Left knee pain  What was this (referring dx) caused by? [x]  Surgery []  Fall []  Ongoing issue []  Arthritis []  Other: ____________  Laterality: []  Rt [x]  Lt []  Both  Check all possible CPT codes:  *CHOOSE 10 OR LESS*    See Planned Interventions listed in the Plan section of the Evaluation.

## 2024-06-25 ENCOUNTER — Encounter (HOSPITAL_BASED_OUTPATIENT_CLINIC_OR_DEPARTMENT_OTHER): Payer: Self-pay | Admitting: Physical Therapy

## 2024-06-25 ENCOUNTER — Ambulatory Visit (HOSPITAL_BASED_OUTPATIENT_CLINIC_OR_DEPARTMENT_OTHER): Admitting: Physical Therapy

## 2024-06-25 DIAGNOSIS — R2689 Other abnormalities of gait and mobility: Secondary | ICD-10-CM | POA: Diagnosis not present

## 2024-06-25 DIAGNOSIS — M25562 Pain in left knee: Secondary | ICD-10-CM

## 2024-06-25 DIAGNOSIS — M6281 Muscle weakness (generalized): Secondary | ICD-10-CM

## 2024-06-25 NOTE — Therapy (Signed)
 [facility]    OUTPATIENT PHYSICAL THERAPY LOWER EXTREMITY TREATMENT     Patient Name: Latasha [name] MRN: [id number] DOB:1961/01/17, 63 y.o., female Today's Date: 03/14/2024   END OF SESSION:    PT End of Session - 06/25/24 1309     Visit Number 28    Number of Visits 32    Date for Recertification  07/09/24    Authorization Type [employer] mcr Progress note to be done at 26    PT Start Time 1300    PT Stop Time 1342    PT Time Calculation (min) 42 min    Activity Tolerance Patient tolerated treatment well    Behavior During Therapy [facility] for tasks assessed/performed                     Patient Active Problem List    Diagnosis Date Noted   History of total knee arthroplasty, left 03/01/2024   Tubular adenoma 02/25/2024   Severe obesity (BMI 35.0-39.9) with comorbidity (HCC) 10/12/2023   Pulmonary nodule 08/21/2023   Hx of total hip arthroplasty, right 10/03/2021   De Quervain's tenosynovitis, right 09/06/2020   B12 deficiency 07/13/2020   Family history of colon cancer 12/01/2017   Benign essential hypertension 03/01/2016   Herpes zoster without complication 03/01/2016   BP (high blood pressure) 09/05/2015   Osteoporosis, post-menopausal 09/05/2015   Postsurgical menopause 09/05/2015   Class 1 obesity 09/05/2015   GERD (gastroesophageal reflux disease) 09/05/2015   Endometriosis 09/05/2015   Status post total hysterectomy and bilateral salpingo-oophorectomy 09/05/2015   Primary osteoarthritis of both knees 09/28/2014    PCP: Oneil Pinal MD    REFERRING PROVIDER: Fonda Koyanagi  PA-C  Hooten, Lynwood Idalia Raddle., MD (surgeon)   REFERRING DIAG: Left TKA    THERAPY DIAG:  No diagnosis found.   Rationale for Evaluation and Treatment: Rehabilitation   ONSET DATE: DOS  03/01/2024   SUBJECTIVE:    SUBJECTIVE STATEMENT:   The patient had a cortisone injection in her back. She continues to ave high levels of pain. She has her sons wedding  tomorrow. She will have to do a lot of standing and walking .  Eval: Pt received PT from 04-02/2024 for bilat knee OA.  She received aquatic therapy  Pt underwent L TKA on 03/01/2024.  At this plan her pain is well-controlled.  She has been at home health and done well.  She did not use the cane some at home.  She practiced using the cane at home with home health.  She is using a walker outside the house.  She has an exercise program that she has been working.  Will review the exercise program today.  Her chief complaint at this time is difficulty sleeping.  She was advised that this is normal at this point.      PERTINENT HISTORY: L TKA on 6/302025 OA R knee and L shoulder. Bilat shoulder pain R THA in 2023 HTN Osteoporosis Anxiety Anemia Chronic fatigue PAIN:  Are you having pain? Yes: NPRS scale: 0/10 Pain location:  Pain description:  Aggravating factors:  Relieving factors:     PRECAUTIONS: None   RED FLAGS: None           WEIGHT BEARING RESTRICTIONS: No   FALLS:  Has patient fallen in last 6 months? No   LIVING ENVIRONMENT: 5 steps into the house   OCCUPATION:  Retired    PLOF: Independent   PATIENT GOALS:  To be  able to her mother son dance by October at her sons wedding/ to be able to walk      OBJECTIVE:  Note: Objective measures were completed at Evaluation unless otherwise noted.   DIAGNOSTIC FINDINGS:  Nothing post op    PATIENT SURVEYS:  LEFS  Extreme difficulty/unable (0), Quite a bit of difficulty (1), Moderate difficulty (2), Little difficulty (3), No difficulty (4) Survey date:  eval 8/28  Any of your usual work, housework or school activities  4  2. Usual hobbies, recreational or sporting activities  3  3. Getting into/out of the bath  3  4. Walking between rooms  3  5. Putting on socks/shoes  3  6. Squatting   3  7. Lifting an object, like a bag of groceries from the floor  2  8. Performing light activities around your home  1  9.  Performing heavy activities around your home  2  10. Getting into/out of a car  3  11. Walking 2 blocks  1  12. Walking 1 mile  1  13. Going up/down 10 stairs (1 flight)  1  14. Standing for 1 hour  1  15.  sitting for 1 hour  1  16. Running on even ground  1  17. Running on uneven ground  1  18. Making sharp turns while running fast  1  19. Hopping   1  20. Rolling over in bed  2  Score total:  23/80 38/80      COGNITION: Overall cognitive status: Within functional limits for tasks assessed                               SENSATION:   EDEMA:  Circumferential: 44.5 R  48.7 L    MUSCLE LENGTH:   POSTURE: rounded shoulders and forward head   PALPATION: No unexpected TTP    LOWER EXTREMITY ROM:   Passive ROM Right eval Left eval left left Left  8/11  L  8/18 Left  10/17  Hip flexion           Hip extension           Hip abduction           Hip adduction           Hip internal rotation           Hip external rotation           Knee flexion   96  100 103 118  123   Knee extension    -3 -3      Ankle dorsiflexion           Ankle plantarflexion           Ankle inversion           Ankle eversion            (Blank rows = not tested)   LOWER EXTREMITY MMT:   MMT (lb) Right 8/21 Left 8/21 Right  Left  Left   Hip flexion     4 4+ 4+  Hip extension         Hip abduction     4 4+ 4+  Hip adduction         Hip internal rotation         Hip external rotation         Knee flexion  35.3 38.8  Knee extension (age norm 59lb)   46.0 46.1 46.0 ( from previous visit)  46.1 ( from previous visit)    Ankle dorsiflexion         Ankle plantarflexion         Ankle inversion         Ankle eversion          (Blank rows = not tested)       GAIT: Decreased left hip flexion Forward trunk flexion  Decreased left single leg                                                                                                                                    TREATMENT  DATE:   [facility] Adult PT  Treatment:     10/24 Manual: Scar tissue release to scar Trigger point release to lateral knee PROM into extension with distraction of the knee   Trigger point release to QL Trigger point release to gluteal  Trigger point release to lower lumbar spine  There-ex:  Quad set 3x15  SAQ 3x12 3lbs  LAQ 3x12  Bilateral hip abduction 3x10 red    Date: 06/23/24 Pt seen for aquatic therapy today.  Treatment took place in water 3.5-4.75 ft in depth at the [facility] pool. Temp of water was 91.  Pt entered/exited the pool via stairs independently with bil rail.  - unsupported walking forward/ backward, multiple laps - unsupported side stepping -> with arm add/abdct with rainbow hand floats-> lunge -  forward walking kick ue support RBHB -knee flex/ext CKC straddled on hollow noodle-> walking across 3.6 ft in same position forward and back - STS from 3rd step unsupported x 10. Good execution.->from 4th from bottom step 2 x 5 (completed well, good challenge) -hamstring and gastroc stretching -Sitting balance on yellow noodle in 3.6 ft (good challenge) lifting alternating ue.   10/17 Manual: Scar tissue release to scar Trigger pint release to lateral knee PROM into extension with distraction   There-ex:  PROM into flexion  Quad set 3x12  SAQ 1.5 lbs 3x12  LAQ 3x12 1.5 lbs   Strength and ROM measurements and review of goals   Date: 06/15/24 Pt seen for aquatic therapy today.  Treatment took place in water 3.5-4.75 ft in depth at the [facility] pool. Temp of water was 91.  Pt entered/exited the pool via stairs independently with bil rail.  - unsupported walking forward/ backward, multiple laps - unsupported side stepping -> with arm add/abdct with rainbow hand floats-> lunge -  forward walking kick ue support RBHB - STS at bench in water, with forward RUE reach and slow controlled descent, x 12(easy, no LOB) -SL STS from bench onto  floor x 10 (no LOB) -hamstring and gastroc stretching - SLS right 2 x 20s hold->dynamic-ue add/abd x 5 (2 LOB) -Sitting balance on yellow noodle in 3.6 ft (good challenge) -STS from 3rd step x  5-> x 5 with add set using BB   06/11/24  Bike L4x6 minutes for w/u   Kallie- attempted limited by HS cramping  HS isometric digs x10 B Figure 4 stretch 2x30 seconds B (modified on R)  LAQs 3# 2x10 L  Seated TA set + alternating march x20 Seated TA set + clamshell green TB x12 STS green TB around knees x10 from mat table        Treatment:                                             Date: 06/08/24 Pt seen for aquatic therapy today.  Treatment took place in water 3.5-4.75 ft in depth at the [facility] pool. Temp of water was 91.  Pt entered/exited the pool via stairs independently with bil rail.  - unsupported walking forward/ backward, multiple laps - unsupported side stepping -> with arm add/abdct with rainbow hand floats-> lunge -  forward walking kick ue support RBHB - SLS right 2 x 20s hold->dynamic-ue add/abd x 5 (2 LOB) - STS at bench in water, with forward RUE reach and slow controlled descent, x 12 -SL STS from bench onto floor x 5->onto water step x5 (good challenge -Sitting balance on yellow noodle in 3.6 ft (good challenge)    10/3 Manual:  Trigger point release to posterior knee and quad  Review of self trigger point release to the quad.  Patella ROM  Extension stretching  There-ex:   LAQ 3x10 2lb  SAQ 3x10 2 lb  Hamstring stretch reviewed technique 3 x 20-second hold Quad set 3 x 15  There-act:  2 inch 2x10 step down  4 inch step up 2 x 10 4 inch lateral step up 2 x 10 Treatment:                                             Date: 06/02/24 Pt seen for aquatic therapy today.  Treatment took place in water 3.5-4.75 ft in depth at the [facility] pool. Temp of water was 91.  Pt entered/exited the pool via stairs independently with bil rail.  -  unsupported walking forward/ backward, multiple laps facing lap pool to assist with leveling pelvis - unsupported side stepping -> with arm add/abdct  -decompression position with noodle wrapped posterior across chest cycling -  forward walking kick -backward amb - heel raises x 10 - squats pushing single yellow hand float under water 2x10    - when dried off: applied reg Rock tape in zigzag pattern to Lt knee incision to assist with scar mobilization and desensitization. Pt instructed in safe tape removal techniques; verbalized understanding.  09/26 Manual:  Trigger point release to posterior knee and quad  Review of self trigger point release to the quad.  Patella ROM  Extension stretching  There-ex:   LAQ 3x10 2lb  SAQ 3x10 2 lb  Hamstring stretch reviewed technique 3 x 20-second hold Quad set 3 x 15   There-act:  2 inch 2x10 step down  4 inch step up        PATIENT EDUCATION:  Education details:   Person educated: Patient Education method: Programmer, Multimedia, Facilities Manager, Actor cues, Verbal cues Education comprehension: verbalized understanding, returned demonstration, verbal cues required, tactile cues  required, and needs further education   HOME EXERCISE PROGRAM: Access Code: [email or web address] URL: [email or web address] Date: 03/22/2024 Prepared by: Harlene Cordon  Exercises - Seated Hamstring Stretch  - 2-3 x daily - 7 x weekly - 2 sets - 5 breaths hold - Seated Heel Slide  - 5 x daily - 7 x weekly - 1 sets - 10 reps - Standing Gluteal Sets  - 5 x daily - 7 x weekly - 1 sets - 10 reps - Standing Weight Shift Side to Side Eyes Closed  - 5 x daily - 7 x weekly - 1 sets - 10 reps - Mini Squat  - 5 x daily - 7 x weekly - 1 sets - 10 reps - Sidelying Hip Abduction  - 3 x daily - 7 x weekly - 3 sets - 10 reps   ASSESSMENT:   CLINICAL IMPRESSION: The patient presented with significant sciatic and lumbar pain today.  Therapy kept her nonweightbearing  secondary to her having to do a large amount of standing and walking tomorrow.  We worked on manual therapy to reduce spasming in the lumbar spine and gluteals.  She reported significant improvement in pain.  We worked on base table exercises.  Will Vance back into stair and functional training such as squats next visit.      OBJECTIVE IMPAIRMENTS: Abnormal gait, decreased activity tolerance, decreased mobility, difficulty walking, decreased ROM, decreased strength, increased edema, increased muscle spasms, and pain.    ACTIVITY LIMITATIONS: carrying, lifting, bending, standing, squatting, stairs, transfers, bed mobility, bathing, and locomotion level   PARTICIPATION LIMITATIONS: meal prep, cleaning, laundry, driving, shopping, community activity, occupation, and yard work   PERSONAL FACTORS: 1-2 comorbidities: hip OA  are also affecting patient's functional outcome.    REHAB POTENTIAL: Good   CLINICAL DECISION MAKING: Stable/uncomplicated   EVALUATION COMPLEXITY: Low     GOALS: Goals reviewed with patient? Yes  04/13/2024     SHORT TERM GOALS: Target date:  Patient will increase passive knee flexion to 128 degrees on the left Baseline: Goal status: Achieved 9/11 goal updated to 128   2.  Patient will increase passive left knee extension to full Baseline:  Goal status: Achieved 9/11 3.  Patient will progress off assistive device and ambulate greater than 500 feet without significant antalgic gait Baseline:  Goal status: Still using cane.  Patient reports she does not feel safe off the cane yet 9/11   4.  Patient will be independent with HEP Baseline:  Goal status: Has initial HEP achieved 9/11     LONG TERM GOALS: Target date: POC     Patient will go up and down 12 steps with reciprocal gait pattern without increased pain Baseline:  Goal status: Begin cane training 9/11   2.  Patient will squat pick items up off the ground without increased pain Baseline:  Goal  status: Working on squats but not achieved 10/17 2nd to sciatica    3.  Patient will ambulate community distances without pain Baseline:  Goal status: Still using cane somewhat 10/17    4.  Patient will have complete HEP to promote strengthening Baseline:  Goal status: Continue to progress towards end of HEP  for knee 10/17      PLAN:   PT FREQUENCY: 2x/week   PT DURATION: 8 weeks   PLANNED INTERVENTIONS: 97110-Therapeutic exercises, 97530- Therapeutic activity, 97112- Neuromuscular re-education, 97535- Self Care, [id number]- Manual therapy, [id number]- Gait training, [phone]- Aquatic Therapy, [id number]- , [id number]- Ultrasound, Patient/Family education, Stair training,  Dry Needling, DME instructions, Cryotherapy, and Moist heat    PLAN FOR NEXT SESSION:  Progressed to functional strengthening such as squats and steps as tolerated. Any updates on possible cortisone shot for her sciatica?   Ronal Aurora) Ziemba MPT 06/25/24 3:03 PM [facility] [address] [place], [place], [address] Phone: [phone]   Fax:  [phone]          Referring diagnosis? Left TKA  Treatment diagnosis? (if different than referring diagnosis) LEft knee stiffness  Left knee pain  What was this (referring dx) caused by? [x]  Surgery []  Fall []  Ongoing issue []  Arthritis []  Other: ____________  Laterality: []  Rt [x]  Lt []  Both  Check all possible CPT codes:  *CHOOSE 10 OR LESS*    See Planned Interventions listed in the Plan section of the Evaluation.

## 2024-06-27 ENCOUNTER — Encounter (HOSPITAL_BASED_OUTPATIENT_CLINIC_OR_DEPARTMENT_OTHER): Payer: Self-pay | Admitting: Physical Therapy

## 2024-06-29 ENCOUNTER — Ambulatory Visit (HOSPITAL_BASED_OUTPATIENT_CLINIC_OR_DEPARTMENT_OTHER): Admitting: Physical Therapy

## 2024-07-02 ENCOUNTER — Ambulatory Visit (HOSPITAL_BASED_OUTPATIENT_CLINIC_OR_DEPARTMENT_OTHER): Admitting: Physical Therapy

## 2024-07-02 ENCOUNTER — Encounter (HOSPITAL_BASED_OUTPATIENT_CLINIC_OR_DEPARTMENT_OTHER): Payer: Self-pay | Admitting: Physical Therapy

## 2024-07-02 DIAGNOSIS — R2689 Other abnormalities of gait and mobility: Secondary | ICD-10-CM

## 2024-07-02 DIAGNOSIS — M6281 Muscle weakness (generalized): Secondary | ICD-10-CM

## 2024-07-02 DIAGNOSIS — M25562 Pain in left knee: Secondary | ICD-10-CM

## 2024-07-02 NOTE — Therapy (Signed)
 [facility]    OUTPATIENT PHYSICAL THERAPY LOWER EXTREMITY TREATMENT     Patient Name: Latasha [name] MRN: [id number] DOB:02-01-1961, 63 y.o., female Today's Date: 03/14/2024   END OF SESSION:                 Patient Active Problem List    Diagnosis Date Noted   History of total knee arthroplasty, left 03/01/2024   Tubular adenoma 02/25/2024   Severe obesity (BMI 35.0-39.9) with comorbidity (HCC) 10/12/2023   Pulmonary nodule 08/21/2023   Hx of total hip arthroplasty, right 10/03/2021   De Quervain's tenosynovitis, right 09/06/2020   B12 deficiency 07/13/2020   Family history of colon cancer 12/01/2017   Benign essential hypertension 03/01/2016   Herpes zoster without complication 03/01/2016   BP (high blood pressure) 09/05/2015   Osteoporosis, post-menopausal 09/05/2015   Postsurgical menopause 09/05/2015   Class 1 obesity 09/05/2015   GERD (gastroesophageal reflux disease) 09/05/2015   Endometriosis 09/05/2015   Status post total hysterectomy and bilateral salpingo-oophorectomy 09/05/2015   Primary osteoarthritis of both knees 09/28/2014    PCP: Oneil Pinal MD    REFERRING PROVIDER: Fonda Koyanagi  PA-C  Hooten, Lynwood Idalia Raddle., MD (surgeon)   REFERRING DIAG: Left TKA    THERAPY DIAG:  No diagnosis found.   Rationale for Evaluation and Treatment: Rehabilitation   ONSET DATE: DOS  03/01/2024   SUBJECTIVE:    SUBJECTIVE STATEMENT:   The patient feels a little better in her back today. She had pain at the wedding on Saturday. She will see the MD next week.   Eval: Pt received PT from 04-02/2024 for bilat knee OA.  She received aquatic therapy  Pt underwent L TKA on 03/01/2024.  At this plan her pain is well-controlled.  She has been at home health and done well.  She did not use the cane some at home.  She practiced using the cane at home with home health.  She is using a walker outside the house.  She has an exercise program that she  has been working.  Will review the exercise program today.  Her chief complaint at this time is difficulty sleeping.  She was advised that this is normal at this point.      PERTINENT HISTORY: L TKA on 6/302025 OA R knee and L shoulder. Bilat shoulder pain R THA in 2023 HTN Osteoporosis Anxiety Anemia Chronic fatigue PAIN:  Are you having pain? Yes: NPRS scale: 0/10 Pain location:  Pain description:  Aggravating factors:  Relieving factors:     PRECAUTIONS: None   RED FLAGS: None           WEIGHT BEARING RESTRICTIONS: No   FALLS:  Has patient fallen in last 6 months? No   LIVING ENVIRONMENT: 5 steps into the house   OCCUPATION:  Retired    PLOF: Independent   PATIENT GOALS:  To be able to her mother son dance by October at her sons wedding/ to be able to walk      OBJECTIVE:  Note: Objective measures were completed at Evaluation unless otherwise noted.   DIAGNOSTIC FINDINGS:  Nothing post op    PATIENT SURVEYS:  LEFS  Extreme difficulty/unable (0), Quite a bit of difficulty (1), Moderate difficulty (2), Little difficulty (3), No difficulty (4) Survey date:  eval 8/28 10/31  Any of your usual work, housework or school activities  4   2. Usual hobbies, recreational or sporting activities  3   3. Getting  into/out of the bath  3   4. Walking between rooms  3   5. Putting on socks/shoes  3   6. Squatting   3   7. Lifting an object, like a bag of groceries from the floor  2   8. Performing light activities around your home  1   9. Performing heavy activities around your home  2   10. Getting into/out of a car  3   11. Walking 2 blocks  1   12. Walking 1 mile  1   13. Going up/down 10 stairs (1 flight)  1   14. Standing for 1 hour  1   15.  sitting for 1 hour  1   16. Running on even ground  1   17. Running on uneven ground  1   18. Making sharp turns while running fast  1   19. Hopping   1   20. Rolling over in bed  2   Score total:  23/80 38/80 47/80        COGNITION: Overall cognitive status: Within functional limits for tasks assessed                               SENSATION:   EDEMA:  Circumferential: 44.5 R  48.7 L    MUSCLE LENGTH:   POSTURE: rounded shoulders and forward head   PALPATION: No unexpected TTP    LOWER EXTREMITY ROM:   Passive ROM Right eval Left eval left left Left  8/11  L  8/18 Left  10/31  Hip flexion           Hip extension           Hip abduction           Hip adduction           Hip internal rotation           Hip external rotation           Knee flexion   96  100 103 118  123 125  Knee extension    -3 -3      Ankle dorsiflexion           Ankle plantarflexion           Ankle inversion           Ankle eversion            (Blank rows = not tested)   LOWER EXTREMITY MMT:   MMT (lb) Right 8/21 Left 8/21 Right  Left  Left  Right  10/31 Left 10/31  Hip flexion     4 4+ 4+ 27.0 27.5  Hip extension           Hip abduction     4 4+ 4+ 32.9 33.9  Hip adduction           Hip internal rotation           Hip external rotation           Knee flexion  35.3 38.8        Knee extension (age norm 60lb)   46.0 46.1 46.0 ( from previous visit)  46.1 ( from previous visit)   32.7 47.1  Ankle dorsiflexion           Ankle plantarflexion           Ankle inversion  Ankle eversion            (Blank rows = not tested)       GAIT: Decreased left hip flexion Forward trunk flexion  Decreased left single leg                                                                                                                                    TREATMENT DATE:   [facility]  Treatment:    10/31 Manual: Trigger point release to QL Trigger point release to gluteal  Trigger point release to lower lumbar spine  There-ex:   SAQ 3x12 3lbs  LAQ 3x12 2lbs  Bilateral hip abduction 3x10 red  PROM into flexion   Reviewed strength and goals.    10/24 Manual: Scar tissue release to  scar Trigger point release to lateral knee PROM into extension with distraction of the knee   Trigger point release to QL Trigger point release to gluteal  Trigger point release to lower lumbar spine  There-ex:  Quad set 3x15  SAQ 3x12 3lbs  LAQ 3x12  Bilateral hip abduction 3x10 red    Date: 06/23/24 Pt seen for aquatic therapy today.  Treatment took place in water 3.5-4.75 ft in depth at the [facility] pool. Temp of water was 91.  Pt entered/exited the pool via stairs independently with bil rail.  - unsupported walking forward/ backward, multiple laps - unsupported side stepping -> with arm add/abdct with rainbow hand floats-> lunge -  forward walking kick ue support RBHB -knee flex/ext CKC straddled on hollow noodle-> walking across 3.6 ft in same position forward and back - STS from 3rd step unsupported x 10. Good execution.->from 4th from bottom step 2 x 5 (completed well, good challenge) -hamstring and gastroc stretching -Sitting balance on yellow noodle in 3.6 ft (good challenge) lifting alternating ue.   10/17 Manual: Scar tissue release to scar Trigger pint release to lateral knee PROM into extension with distraction   There-ex:  PROM into flexion  Quad set 3x12  SAQ 1.5 lbs 3x12  LAQ 3x12 1.5 lbs   Strength and ROM measurements and review of goals   Date: 06/15/24 Pt seen for aquatic therapy today.  Treatment took place in water 3.5-4.75 ft in depth at the [facility] pool. Temp of water was 91.  Pt entered/exited the pool via stairs independently with bil rail.  - unsupported walking forward/ backward, multiple laps - unsupported side stepping -> with arm add/abdct with rainbow hand floats-> lunge -  forward walking kick ue support RBHB - STS at bench in water, with forward RUE reach and slow controlled descent, x 12(easy, no LOB) -SL STS from bench onto floor x 10 (no LOB) -hamstring and gastroc stretching - SLS right 2 x 20s  hold->dynamic-ue add/abd x 5 (2 LOB) -Sitting balance on yellow noodle in 3.6 ft (good challenge) -STS from 3rd step x 5-> x  5 with add set using BB   06/11/24  Bike L4x6 minutes for w/u   Kallie- attempted limited by HS cramping  HS isometric digs x10 B Figure 4 stretch 2x30 seconds B (modified on R)  LAQs 3# 2x10 L  Seated TA set + alternating march x20 Seated TA set + clamshell green TB x12 STS green TB around knees x10 from mat table        Treatment:                                             Date: 06/08/24 Pt seen for aquatic therapy today.  Treatment took place in water 3.5-4.75 ft in depth at the [facility] pool. Temp of water was 91.  Pt entered/exited the pool via stairs independently with bil rail.  - unsupported walking forward/ backward, multiple laps - unsupported side stepping -> with arm add/abdct with rainbow hand floats-> lunge -  forward walking kick ue support RBHB - SLS right 2 x 20s hold->dynamic-ue add/abd x 5 (2 LOB) - STS at bench in water, with forward RUE reach and slow controlled descent, x 12 -SL STS from bench onto floor x 5->onto water step x5 (good challenge -Sitting balance on yellow noodle in 3.6 ft (good challenge)    10/3 Manual:  Trigger point release to posterior knee and quad  Review of self trigger point release to the quad.  Patella ROM  Extension stretching  There-ex:   LAQ 3x10 2lb  SAQ 3x10 2 lb  Hamstring stretch reviewed technique 3 x 20-second hold Quad set 3 x 15  There-act:  2 inch 2x10 step down  4 inch step up 2 x 10 4 inch lateral step up 2 x 10 Treatment:                                             Date: 06/02/24 Pt seen for aquatic therapy today.  Treatment took place in water 3.5-4.75 ft in depth at the [facility] pool. Temp of water was 91.  Pt entered/exited the pool via stairs independently with bil rail.  - unsupported walking forward/ backward, multiple laps facing lap pool to  assist with leveling pelvis - unsupported side stepping -> with arm add/abdct  -decompression position with noodle wrapped posterior across chest cycling -  forward walking kick -backward amb - heel raises x 10 - squats pushing single yellow hand float under water 2x10    - when dried off: applied reg Rock tape in zigzag pattern to Lt knee incision to assist with scar mobilization and desensitization. Pt instructed in safe tape removal techniques; verbalized understanding.  09/26 Manual:  Trigger point release to posterior knee and quad  Review of self trigger point release to the quad.  Patella ROM  Extension stretching  There-ex:   LAQ 3x10 2lb  SAQ 3x10 2 lb  Hamstring stretch reviewed technique 3 x 20-second hold Quad set 3 x 15   There-act:  2 inch 2x10 step down  4 inch step up        PATIENT EDUCATION:  Education details:   Person educated: Patient Education method: Programmer, Multimedia, Facilities Manager, Actor cues, Verbal cues Education comprehension: verbalized understanding, returned demonstration, verbal cues required, tactile cues required, and  needs further education   HOME EXERCISE PROGRAM: Access Code: [email or web address] URL: [email or web address] Date: 03/22/2024 Prepared by: Harlene Cordon  Exercises - Seated Hamstring Stretch  - 2-3 x daily - 7 x weekly - 2 sets - 5 breaths hold - Seated Heel Slide  - 5 x daily - 7 x weekly - 1 sets - 10 reps - Standing Gluteal Sets  - 5 x daily - 7 x weekly - 1 sets - 10 reps - Standing Weight Shift Side to Side Eyes Closed  - 5 x daily - 7 x weekly - 1 sets - 10 reps - Mini Squat  - 5 x daily - 7 x weekly - 1 sets - 10 reps - Sidelying Hip Abduction  - 3 x daily - 7 x weekly - 3 sets - 10 reps   ASSESSMENT:   CLINICAL IMPRESSION: The patients knee has progressed well. She has full ROM and her strength has improved significantly. She is currently limited by sciatica in her back. We have had some difficulty  progressing her into  her functional training. She is still using a cane. She is also still having difficulty going down steps. She would benefit from further skilled therapy to improve general functional mobility.     OBJECTIVE IMPAIRMENTS: Abnormal gait, decreased activity tolerance, decreased mobility, difficulty walking, decreased ROM, decreased strength, increased edema, increased muscle spasms, and pain.    ACTIVITY LIMITATIONS: carrying, lifting, bending, standing, squatting, stairs, transfers, bed mobility, bathing, and locomotion level   PARTICIPATION LIMITATIONS: meal prep, cleaning, laundry, driving, shopping, community activity, occupation, and yard work   PERSONAL FACTORS: 1-2 comorbidities: hip OA  are also affecting patient's functional outcome.    REHAB POTENTIAL: Good   CLINICAL DECISION MAKING: Stable/uncomplicated   EVALUATION COMPLEXITY: Low     GOALS: Goals reviewed with patient? Yes  04/13/2024     SHORT TERM GOALS: Target date:  Patient will increase passive knee flexion to 128 degrees on the left Baseline: Goal status: achived 10/31  2.  Patient will increase passive left knee extension to full Baseline:  Goal status: Achieved 9/11 3.  Patient will progress off assistive device and ambulate greater than 500 feet without significant antalgic gait Baseline:  Goal status: Still using cane.  Patient reports she does not feel safe off the cane yet 10/31   4.  Patient will be independent with HEP Baseline:  Goal status: Has initial HEP achieved 10/31     LONG TERM GOALS: Target date: POC     Patient will go up and down 12 steps with reciprocal gait pattern without increased pain Baseline:  Goal status: stairs have been limited by her back     2.  Patient will squat pick items up off the ground without increased pain Baseline:  Goal status: Working on squats but not achieved 10/31 2nd to sciatica    3.  Patient will ambulate community distances  without pain Baseline:  Goal status: Still using cane somewhat 10/31    4.  Patient will have complete HEP to promote strengthening Baseline:  Goal status: Continue to progress towards end of HEP  for knee 10/31      PLAN:   PT FREQUENCY: 2x/week   PT DURATION: 8 weeks   PLANNED INTERVENTIONS: 97110-Therapeutic exercises, 97530- Therapeutic activity, 97112- Neuromuscular re-education, 97535- Self Care, [id number]- Manual therapy, [id number]- Gait training, [phone]- Aquatic Therapy, [id number]- , [id number]- Ultrasound, Patient/Family education, Stair training, Dry Needling, DME instructions, Cryotherapy, and Moist heat  PLAN FOR NEXT SESSION:  Progressed to functional strengthening such as squats and steps as tolerated. Re-assess back if prescribed   Alm Don PT DPT 07/02/24 1:05 PM [facility] [address] [place], [place], [address] Phone: [phone]   Fax:  [phone]          Referring diagnosis? Left TKA  Treatment diagnosis? (if different than referring diagnosis) LEft knee stiffness  Left knee pain  What was this (referring dx) caused by? [x]  Surgery []  Fall []  Ongoing issue []  Arthritis []  Other: ____________  Laterality: []  Rt [x]  Lt []  Both  Check all possible CPT codes:  *CHOOSE 10 OR LESS*    See Planned Interventions listed in the Plan section of the Evaluation.

## 2024-07-31 ENCOUNTER — Ambulatory Visit (HOSPITAL_BASED_OUTPATIENT_CLINIC_OR_DEPARTMENT_OTHER): Payer: Self-pay | Attending: Student | Admitting: Physical Therapy

## 2024-07-31 DIAGNOSIS — R2689 Other abnormalities of gait and mobility: Secondary | ICD-10-CM | POA: Diagnosis present

## 2024-07-31 DIAGNOSIS — M6281 Muscle weakness (generalized): Secondary | ICD-10-CM | POA: Diagnosis present

## 2024-07-31 DIAGNOSIS — M25562 Pain in left knee: Secondary | ICD-10-CM | POA: Insufficient documentation

## 2024-07-31 NOTE — Therapy (Signed)
 [facility]    OUTPATIENT PHYSICAL THERAPY LOWER EXTREMITY TREATMENT     Patient Name: Latasha [name] MRN: [id number] DOB:1961/05/19, 63 y.o., female Today's Date: 03/14/2024   END OF SESSION:    PT End of Session - 07/31/24 0950     Visit Number 30    Number of Visits 45    Date for Recertification  08/27/24    Authorization Type [employer] mcr Progress note to be done at 26    PT Start Time 0945    PT Stop Time 1030    PT Time Calculation (min) 45 min    Activity Tolerance Patient tolerated treatment well    Behavior During Therapy [facility] for tasks assessed/performed                      Patient Active Problem List    Diagnosis Date Noted   History of total knee arthroplasty, left 03/01/2024   Tubular adenoma 02/25/2024   Severe obesity (BMI 35.0-39.9) with comorbidity (HCC) 10/12/2023   Pulmonary nodule 08/21/2023   Hx of total hip arthroplasty, right 10/03/2021   De Quervain's tenosynovitis, right 09/06/2020   B12 deficiency 07/13/2020   Family history of colon cancer 12/01/2017   Benign essential hypertension 03/01/2016   Herpes zoster without complication 03/01/2016   BP (high blood pressure) 09/05/2015   Osteoporosis, post-menopausal 09/05/2015   Postsurgical menopause 09/05/2015   Class 1 obesity 09/05/2015   GERD (gastroesophageal reflux disease) 09/05/2015   Endometriosis 09/05/2015   Status post total hysterectomy and bilateral salpingo-oophorectomy 09/05/2015   Primary osteoarthritis of both knees 09/28/2014    PCP: Oneil Pinal MD    REFERRING PROVIDER: Fonda Koyanagi  PA-C  Hooten, Lynwood Idalia Raddle., MD (surgeon)   REFERRING DIAG: Left TKA    THERAPY DIAG:  No diagnosis found.   Rationale for Evaluation and Treatment: Rehabilitation   ONSET DATE: DOS  03/01/2024   SUBJECTIVE:    SUBJECTIVE STATEMENT:   Returns to physical therapy after about a month layoff.  She reports she tried to get on the wait list several times  but has not heard anything back.  Patient reports overall she has not been working on her exercises very much.  She is plan to have surgery on her right knee in March.  She reports overall she has been walking more.  She walked for several hours yesterday.  She had significant pain in her right knee.  She has some pain in her left side as well.  She continues to have sciatic pain.  Eval: Pt received PT from 04-02/2024 for bilat knee OA.  She received aquatic therapy  Pt underwent L TKA on 03/01/2024.  At this plan her pain is well-controlled.  She has been at home health and done well.  She did not use the cane some at home.  She practiced using the cane at home with home health.  She is using a walker outside the house.  She has an exercise program that she has been working.  Will review the exercise program today.  Her chief complaint at this time is difficulty sleeping.  She was advised that this is normal at this point.      PERTINENT HISTORY: L TKA on 6/302025 OA R knee and L shoulder. Bilat shoulder pain R THA in 2023 HTN Osteoporosis Anxiety Anemia Chronic fatigue PAIN:  Are you having pain? Yes: NPRS scale: 0/10 Pain location:  Pain description:  Aggravating factors:  Relieving  factors:     PRECAUTIONS: None   RED FLAGS: None           WEIGHT BEARING RESTRICTIONS: No   FALLS:  Has patient fallen in last 6 months? No   LIVING ENVIRONMENT: 5 steps into the house   OCCUPATION:  Retired    PLOF: Independent   PATIENT GOALS:  To be able to her mother son dance by October at her sons wedding/ to be able to walk      OBJECTIVE:  Note: Objective measures were completed at Evaluation unless otherwise noted.   DIAGNOSTIC FINDINGS:  Nothing post op    PATIENT SURVEYS:  LEFS  Extreme difficulty/unable (0), Quite a bit of difficulty (1), Moderate difficulty (2), Little difficulty (3), No difficulty (4) Survey date:  eval 8/28 10/31  Any of your usual work, housework  or school activities  4   2. Usual hobbies, recreational or sporting activities  3   3. Getting into/out of the bath  3   4. Walking between rooms  3   5. Putting on socks/shoes  3   6. Squatting   3   7. Lifting an object, like a bag of groceries from the floor  2   8. Performing light activities around your home  1   9. Performing heavy activities around your home  2   10. Getting into/out of a car  3   11. Walking 2 blocks  1   12. Walking 1 mile  1   13. Going up/down 10 stairs (1 flight)  1   14. Standing for 1 hour  1   15.  sitting for 1 hour  1   16. Running on even ground  1   17. Running on uneven ground  1   18. Making sharp turns while running fast  1   19. Hopping   1   20. Rolling over in bed  2   Score total:  23/80 38/80 47/80       COGNITION: Overall cognitive status: Within functional limits for tasks assessed                               SENSATION:   EDEMA:  Circumferential: 44.5 R  48.7 L    MUSCLE LENGTH:   POSTURE: rounded shoulders and forward head   PALPATION: No unexpected TTP    LOWER EXTREMITY ROM:   Passive ROM Right eval Left eval left left Left  8/11  L  8/18 Left  10/31  Hip flexion           Hip extension           Hip abduction           Hip adduction           Hip internal rotation           Hip external rotation           Knee flexion   96  100 103 118  123 125  Knee extension    -3 -3      Ankle dorsiflexion           Ankle plantarflexion           Ankle inversion           Ankle eversion            (Blank rows = not tested)   LOWER  EXTREMITY MMT:   MMT (lb) Right 8/21 Left 8/21 Right  Left  Left  Right  10/31 Left 10/31  Hip flexion     4 4+ 4+ 27.0 27.5  Hip extension           Hip abduction     4 4+ 4+ 32.9 33.9  Hip adduction           Hip internal rotation           Hip external rotation           Knee flexion  35.3 38.8        Knee extension (age norm 60lb)   46.0 46.1 46.0 ( from previous visit)   46.1 ( from previous visit)   32.7 47.1  Ankle dorsiflexion           Ankle plantarflexion           Ankle inversion           Ankle eversion            (Blank rows = not tested)       GAIT: Decreased left hip flexion Forward trunk flexion  Decreased left single leg                                                                                                                                    TREATMENT DATE:   [facility]  Treatment:    11/29  There-ex:   SAQ 3x12 3lbs  LAQ 3x12 2lbs  Bilateral hip abduction 3x10 red  Standing weight shift x10 but caused pain on the right side   LTR 2x10    There-act:  Step up 4 inch 3x10 L only  Side step 3x10 4 inch  10/31 Manual: Trigger point release to QL Trigger point release to gluteal  Trigger point release to lower lumbar spine  There-ex:   SAQ 3x12 3lbs  LAQ 3x12 2lbs  Bilateral hip abduction 3x10 red  PROM into flexion   Reviewed strength and goals.    10/24 Manual: Scar tissue release to scar Trigger point release to lateral knee PROM into extension with distraction of the knee   Trigger point release to QL Trigger point release to gluteal  Trigger point release to lower lumbar spine  There-ex:  Quad set 3x15  SAQ 3x12 3lbs  LAQ 3x12  Bilateral hip abduction 3x10 red    Date: 06/23/24 Pt seen for aquatic therapy today.  Treatment took place in water 3.5-4.75 ft in depth at the [facility] pool. Temp of water was 91.  Pt entered/exited the pool via stairs independently with bil rail.  - unsupported walking forward/ backward, multiple laps - unsupported side stepping -> with arm add/abdct with rainbow hand floats-> lunge -  forward walking kick ue support RBHB -knee flex/ext CKC straddled on hollow noodle-> walking across 3.6 ft in same position forward and  back - STS from 3rd step unsupported x 10. Good execution.->from 4th from bottom step 2 x 5 (completed well, good  challenge) -hamstring and gastroc stretching -Sitting balance on yellow noodle in 3.6 ft (good challenge) lifting alternating ue.   10/17 Manual: Scar tissue release to scar Trigger pint release to lateral knee PROM into extension with distraction   There-ex:  PROM into flexion  Quad set 3x12  SAQ 1.5 lbs 3x12  LAQ 3x12 1.5 lbs   Strength and ROM measurements and review of goals   Date: 06/15/24 Pt seen for aquatic therapy today.  Treatment took place in water 3.5-4.75 ft in depth at the [facility] pool. Temp of water was 91.  Pt entered/exited the pool via stairs independently with bil rail.  - unsupported walking forward/ backward, multiple laps - unsupported side stepping -> with arm add/abdct with rainbow hand floats-> lunge -  forward walking kick ue support RBHB - STS at bench in water, with forward RUE reach and slow controlled descent, x 12(easy, no LOB) -SL STS from bench onto floor x 10 (no LOB) -hamstring and gastroc stretching - SLS right 2 x 20s hold->dynamic-ue add/abd x 5 (2 LOB) -Sitting balance on yellow noodle in 3.6 ft (good challenge) -STS from 3rd step x 5-> x 5 with add set using BB   06/11/24  Bike L4x6 minutes for w/u   Kallie- attempted limited by HS cramping  HS isometric digs x10 B Figure 4 stretch 2x30 seconds B (modified on R)  LAQs 3# 2x10 L  Seated TA set + alternating march x20 Seated TA set + clamshell green TB x12 STS green TB around knees x10 from mat table        Treatment:                                             Date: 06/08/24 Pt seen for aquatic therapy today.  Treatment took place in water 3.5-4.75 ft in depth at the [facility] pool. Temp of water was 91.  Pt entered/exited the pool via stairs independently with bil rail.  - unsupported walking forward/ backward, multiple laps - unsupported side stepping -> with arm add/abdct with rainbow hand floats-> lunge -  forward walking kick ue support  RBHB - SLS right 2 x 20s hold->dynamic-ue add/abd x 5 (2 LOB) - STS at bench in water, with forward RUE reach and slow controlled descent, x 12 -SL STS from bench onto floor x 5->onto water step x5 (good challenge -Sitting balance on yellow noodle in 3.6 ft (good challenge)     PATIENT EDUCATION:  Education details:   Person educated: Patient Education method: Programmer, Multimedia, Demonstration, Actor cues, Verbal cues Education comprehension: verbalized understanding, returned demonstration, verbal cues required, tactile cues required, and needs further education   HOME EXERCISE PROGRAM: Access Code: [email or web address] URL: [email or web address] Date: 03/22/2024 Prepared by: Harlene Cordon  Exercises - Seated Hamstring Stretch  - 2-3 x daily - 7 x weekly - 2 sets - 5 breaths hold - Seated Heel Slide  - 5 x daily - 7 x weekly - 1 sets - 10 reps - Standing Gluteal Sets  - 5 x daily - 7 x weekly - 1 sets - 10 reps - Standing Weight Shift Side to Side Eyes Closed  - 5 x daily - 7 x weekly - 1 sets - 10 reps -  Mini Squat  - 5 x daily - 7 x weekly - 1 sets - 10 reps - Sidelying Hip Abduction  - 3 x daily - 7 x weekly - 3 sets - 10 reps   ASSESSMENT:   CLINICAL IMPRESSION: Therapy reviewed the patient's base exercise program.  We strongly encouraged her to continue with her exercise program.  She was advised that she needs to work on her exercises at home.  She tolerated her exercise well.  We are able to continue with the weights that she was doing prior to her layoff.  She continues to have sciatic pain.  We advised her that as we continue to strengthen her leg that should help her sciatic pain in general.  Is likely coming from her gait deficits.     OBJECTIVE IMPAIRMENTS: Abnormal gait, decreased activity tolerance, decreased mobility, difficulty walking, decreased ROM, decreased strength, increased edema, increased muscle spasms, and pain.    ACTIVITY LIMITATIONS: carrying,  lifting, bending, standing, squatting, stairs, transfers, bed mobility, bathing, and locomotion level   PARTICIPATION LIMITATIONS: meal prep, cleaning, laundry, driving, shopping, community activity, occupation, and yard work   PERSONAL FACTORS: 1-2 comorbidities: hip OA  are also affecting patient's functional outcome.    REHAB POTENTIAL: Good   CLINICAL DECISION MAKING: Stable/uncomplicated   EVALUATION COMPLEXITY: Low     GOALS: Goals reviewed with patient? Yes  04/13/2024     SHORT TERM GOALS: Target date:  Patient will increase passive knee flexion to 128 degrees on the left Baseline: Goal status: achived 10/31  2.  Patient will increase passive left knee extension to full Baseline:  Goal status: Achieved 9/11 3.  Patient will progress off assistive device and ambulate greater than 500 feet without significant antalgic gait Baseline:  Goal status: Still using cane.  Patient reports she does not feel safe off the cane yet 10/31   4.  Patient will be independent with HEP Baseline:  Goal status: Has initial HEP achieved 10/31     LONG TERM GOALS: Target date: POC     Patient will go up and down 12 steps with reciprocal gait pattern without increased pain Baseline:  Goal status: stairs have been limited by her back     2.  Patient will squat pick items up off the ground without increased pain Baseline:  Goal status: Working on squats but not achieved 10/31 2nd to sciatica    3.  Patient will ambulate community distances without pain Baseline:  Goal status: Still using cane somewhat 10/31    4.  Patient will have complete HEP to promote strengthening Baseline:  Goal status: Continue to progress towards end of HEP  for knee 10/31      PLAN:   PT FREQUENCY: 2x/week   PT DURATION: 8 weeks   PLANNED INTERVENTIONS: 97110-Therapeutic exercises, 97530- Therapeutic activity, 97112- Neuromuscular re-education, 97535- Self Care, [id number]- Manual therapy, [id number]-  Gait training, [phone]- Aquatic Therapy, [id number]- , [id number]- Ultrasound, Patient/Family education, Stair training, Dry Needling, DME instructions, Cryotherapy, and Moist heat    PLAN FOR NEXT SESSION:  Progressed to functional strengthening such as squats and steps as tolerated. Re-assess back if prescribed   Alm Don PT DPT 07/31/24 9:51 AM [facility] [address] [place], [place], [address] Phone: [phone]   Fax:  [phone]          Referring diagnosis? Left TKA  Treatment diagnosis? (if different than referring diagnosis) LEft knee stiffness  Left knee pain  What was this (referring dx) caused by? [x]  Surgery []  Fall []  Ongoing issue []  Arthritis []  Other: ____________  Laterality: []  Rt [x]  Lt []  Both  Check all possible CPT codes:  *CHOOSE 10 OR LESS*    See Planned Interventions listed in the Plan section of the Evaluation.

## 2024-08-03 ENCOUNTER — Ambulatory Visit (HOSPITAL_BASED_OUTPATIENT_CLINIC_OR_DEPARTMENT_OTHER): Attending: Student | Admitting: Physical Therapy

## 2024-08-03 ENCOUNTER — Encounter (HOSPITAL_BASED_OUTPATIENT_CLINIC_OR_DEPARTMENT_OTHER): Payer: Self-pay | Admitting: Physical Therapy

## 2024-08-03 DIAGNOSIS — M6281 Muscle weakness (generalized): Secondary | ICD-10-CM | POA: Diagnosis present

## 2024-08-03 DIAGNOSIS — M25562 Pain in left knee: Secondary | ICD-10-CM | POA: Diagnosis present

## 2024-08-03 DIAGNOSIS — R2689 Other abnormalities of gait and mobility: Secondary | ICD-10-CM | POA: Diagnosis present

## 2024-08-03 NOTE — Therapy (Signed)
 [facility]    OUTPATIENT PHYSICAL THERAPY LOWER EXTREMITY TREATMENT     Patient Name: Latasha [name] MRN: [id number] DOB:[birth year], 63 y.o., female Today's Date: 03/14/2024   END OF SESSION:    PT End of Session - 08/03/24 1411     Visit Number 31    Number of Visits 45    Date for Recertification  08/27/24    Authorization Type [employer] mcr Progress note to be done at 26    Authorization Time Period 10 visits 11/29  to 12/26    PT Start Time 1358   Patient 13 min late   PT Stop Time 1430    PT Time Calculation (min) 32 min    Activity Tolerance Patient tolerated treatment well    Behavior During Therapy [facility] for tasks assessed/performed                      Patient Active Problem List    Diagnosis Date Noted   History of total knee arthroplasty, left 03/01/2024   Tubular adenoma 02/25/2024   Severe obesity (BMI 35.0-39.9) with comorbidity (HCC) 10/12/2023   Pulmonary nodule 08/21/2023   Hx of total hip arthroplasty, right 10/03/2021   De Quervain's tenosynovitis, right 09/06/2020   B12 deficiency 07/13/2020   Family history of colon cancer 12/01/2017   Benign essential hypertension 03/01/2016   Herpes zoster without complication 03/01/2016   BP (high blood pressure) 09/05/2015   Osteoporosis, post-menopausal 09/05/2015   Postsurgical menopause 09/05/2015   Class 1 obesity 09/05/2015   GERD (gastroesophageal reflux disease) 09/05/2015   Endometriosis 09/05/2015   Status post total hysterectomy and bilateral salpingo-oophorectomy 09/05/2015   Primary osteoarthritis of both knees 09/28/2014    PCP: Oneil Pinal MD    REFERRING PROVIDER: Fonda Koyanagi  PA-C  Hooten, Lynwood Idalia Raddle., MD (surgeon)   REFERRING DIAG: Left TKA    THERAPY DIAG:  No diagnosis found.   Rationale for Evaluation and Treatment: Rehabilitation   ONSET DATE: DOS  03/01/2024   SUBJECTIVE:    SUBJECTIVE STATEMENT:   The patient  reports significant pain in  her right knee today. She feels like it may be weather related.   Eval: Pt received PT from 04-02/2024 for bilat knee OA.  She received aquatic therapy  Pt underwent L TKA on 03/01/2024.  At this plan her pain is well-controlled.  She has been at home health and done well.  She did not use the cane some at home.  She practiced using the cane at home with home health.  She is using a walker outside the house.  She has an exercise program that she has been working.  Will review the exercise program today.  Her chief complaint at this time is difficulty sleeping.  She was advised that this is normal at this point.      PERTINENT HISTORY: L TKA on 6/302025 OA R knee and L shoulder. Bilat shoulder pain R THA in 2023 HTN Osteoporosis Anxiety Anemia Chronic fatigue PAIN:  Are you having pain? Yes: NPRS scale: 0/10 Pain location:  Pain description:  Aggravating factors:  Relieving factors:     PRECAUTIONS: None   RED FLAGS: None           WEIGHT BEARING RESTRICTIONS: No   FALLS:  Has patient fallen in last 6 months? No   LIVING ENVIRONMENT: 5 steps into the house   OCCUPATION:  Retired    PLOF: Independent   PATIENT GOALS:  To be able to her mother son dance by October at her sons wedding/ to be able to walk      OBJECTIVE:  Note: Objective measures were completed at Evaluation unless otherwise noted.   DIAGNOSTIC FINDINGS:  Nothing post op    PATIENT SURVEYS:  LEFS  Extreme difficulty/unable (0), Quite a bit of difficulty (1), Moderate difficulty (2), Little difficulty (3), No difficulty (4) Survey date:  eval 8/28 10/31  Any of your usual work, housework or school activities  4   2. Usual hobbies, recreational or sporting activities  3   3. Getting into/out of the bath  3   4. Walking between rooms  3   5. Putting on socks/shoes  3   6. Squatting   3   7. Lifting an object, like a bag of groceries from the floor  2   8. Performing light activities around your  home  1   9. Performing heavy activities around your home  2   10. Getting into/out of a car  3   11. Walking 2 blocks  1   12. Walking 1 mile  1   13. Going up/down 10 stairs (1 flight)  1   14. Standing for 1 hour  1   15.  sitting for 1 hour  1   16. Running on even ground  1   17. Running on uneven ground  1   18. Making sharp turns while running fast  1   19. Hopping   1   20. Rolling over in bed  2   Score total:  23/80 38/80 47/80       COGNITION: Overall cognitive status: Within functional limits for tasks assessed                               SENSATION:   EDEMA:  Circumferential: 44.5 R  48.7 L    MUSCLE LENGTH:   POSTURE: rounded shoulders and forward head   PALPATION: No unexpected TTP    LOWER EXTREMITY ROM:   Passive ROM Right eval Left eval left left Left  8/11  L  8/18 Left  10/31  Hip flexion           Hip extension           Hip abduction           Hip adduction           Hip internal rotation           Hip external rotation           Knee flexion   96  100 103 118  123 125  Knee extension    -3 -3      Ankle dorsiflexion           Ankle plantarflexion           Ankle inversion           Ankle eversion            (Blank rows = not tested)   LOWER EXTREMITY MMT:   MMT (lb) Right 8/21 Left 8/21 Right  Left  Left  Right  10/31 Left 10/31  Hip flexion     4 4+ 4+ 27.0 27.5  Hip extension           Hip abduction     4 4+ 4+ 32.9 33.9  Hip adduction  Hip internal rotation           Hip external rotation           Knee flexion  35.3 38.8        Knee extension (age norm 60lb)   46.0 46.1 46.0 ( from previous visit)  46.1 ( from previous visit)   32.7 47.1  Ankle dorsiflexion           Ankle plantarflexion           Ankle inversion           Ankle eversion            (Blank rows = not tested)       GAIT: Decreased left hip flexion Forward trunk flexion  Decreased left single leg                                                                                                                                     TREATMENT DATE:   [facility]  Treatment:    12/2 Manual: Edema massage to right knee  Trigger point release to IT band     SAQ 3x12 3lbs  LAQ 3x12 2lbs  Bilateral hip abduction 3x10 Green     11/29  There-ex:   SAQ 3x12 3lbs  LAQ 3x12 2lbs  Bilateral hip abduction 3x10 red  Standing weight shift x10 but caused pain on the right side   LTR 2x10    There-act:  Step up 4 inch 3x10 L only  Side step 3x10 4 inch  10/31 Manual: Trigger point release to QL Trigger point release to gluteal  Trigger point release to lower lumbar spine  There-ex:   SAQ 3x12 3lbs  LAQ 3x12 2lbs  Bilateral hip abduction 3x10 red  PROM into flexion   Reviewed strength and goals.    10/24 Manual: Scar tissue release to scar Trigger point release to lateral knee PROM into extension with distraction of the knee   Trigger point release to QL Trigger point release to gluteal  Trigger point release to lower lumbar spine  There-ex:  Quad set 3x15  SAQ 3x12 3lbs  LAQ 3x12  Bilateral hip abduction 3x10 red    Date: 06/23/24 Pt seen for aquatic therapy today.  Treatment took place in water 3.5-4.75 ft in depth at the [facility] pool. Temp of water was 91.  Pt entered/exited the pool via stairs independently with bil rail.  - unsupported walking forward/ backward, multiple laps - unsupported side stepping -> with arm add/abdct with rainbow hand floats-> lunge -  forward walking kick ue support RBHB -knee flex/ext CKC straddled on hollow noodle-> walking across 3.6 ft in same position forward and back - STS from 3rd step unsupported x 10. Good execution.->from 4th from bottom step 2 x 5 (completed well, good challenge) -hamstring and gastroc stretching -Sitting balance on yellow noodle in 3.6 ft (good  challenge) lifting alternating ue.   10/17 Manual: Scar tissue release to  scar Trigger pint release to lateral knee PROM into extension with distraction   There-ex:  PROM into flexion  Quad set 3x12  SAQ 1.5 lbs 3x12  LAQ 3x12 1.5 lbs   Strength and ROM measurements and review of goals   Date: 06/15/24 Pt seen for aquatic therapy today.  Treatment took place in water 3.5-4.75 ft in depth at the [facility] pool. Temp of water was 91.  Pt entered/exited the pool via stairs independently with bil rail.  - unsupported walking forward/ backward, multiple laps - unsupported side stepping -> with arm add/abdct with rainbow hand floats-> lunge -  forward walking kick ue support RBHB - STS at bench in water, with forward RUE reach and slow controlled descent, x 12(easy, no LOB) -SL STS from bench onto floor x 10 (no LOB) -hamstring and gastroc stretching - SLS right 2 x 20s hold->dynamic-ue add/abd x 5 (2 LOB) -Sitting balance on yellow noodle in 3.6 ft (good challenge) -STS from 3rd step x 5-> x 5 with add set using BB   06/11/24  Bike L4x6 minutes for w/u   Kallie- attempted limited by HS cramping  HS isometric digs x10 B Figure 4 stretch 2x30 seconds B (modified on R)  LAQs 3# 2x10 L  Seated TA set + alternating march x20 Seated TA set + clamshell green TB x12 STS green TB around knees x10 from mat table        Treatment:                                             Date: 06/08/24 Pt seen for aquatic therapy today.  Treatment took place in water 3.5-4.75 ft in depth at the [facility] pool. Temp of water was 91.  Pt entered/exited the pool via stairs independently with bil rail.  - unsupported walking forward/ backward, multiple laps - unsupported side stepping -> with arm add/abdct with rainbow hand floats-> lunge -  forward walking kick ue support RBHB - SLS right 2 x 20s hold->dynamic-ue add/abd x 5 (2 LOB) - STS at bench in water, with forward RUE reach and slow controlled descent, x 12 -SL STS from bench onto floor x  5->onto water step x5 (good challenge -Sitting balance on yellow noodle in 3.6 ft (good challenge)     PATIENT EDUCATION:  Education details:   Person educated: Patient Education method: Programmer, Multimedia, Demonstration, Actor cues, Verbal cues Education comprehension: verbalized understanding, returned demonstration, verbal cues required, tactile cues required, and needs further education   HOME EXERCISE PROGRAM: Access Code: [email or web address] URL: [email or web address] Date: 03/22/2024 Prepared by: Harlene Cordon  Exercises - Seated Hamstring Stretch  - 2-3 x daily - 7 x weekly - 2 sets - 5 breaths hold - Seated Heel Slide  - 5 x daily - 7 x weekly - 1 sets - 10 reps - Standing Gluteal Sets  - 5 x daily - 7 x weekly - 1 sets - 10 reps - Standing Weight Shift Side to Side Eyes Closed  - 5 x daily - 7 x weekly - 1 sets - 10 reps - Mini Squat  - 5 x daily - 7 x weekly - 1 sets - 10 reps - Sidelying Hip Abduction  - 3 x daily - 7 x weekly - 3 sets -  10 reps   ASSESSMENT:   CLINICAL IMPRESSION: The patients right knee was limiting today. We held on weight bearing exercises 2nd to significant swelling in her right knee and pain with weight bearing. We were also limited by time. We reviewed her basic exercises for her left LE. Therapy will continue to progress as tolerated.    OBJECTIVE IMPAIRMENTS: Abnormal gait, decreased activity tolerance, decreased mobility, difficulty walking, decreased ROM, decreased strength, increased edema, increased muscle spasms, and pain.    ACTIVITY LIMITATIONS: carrying, lifting, bending, standing, squatting, stairs, transfers, bed mobility, bathing, and locomotion level   PARTICIPATION LIMITATIONS: meal prep, cleaning, laundry, driving, shopping, community activity, occupation, and yard work   PERSONAL FACTORS: 1-2 comorbidities: hip OA  are also affecting patient's functional outcome.    REHAB POTENTIAL: Good   CLINICAL DECISION MAKING:  Stable/uncomplicated   EVALUATION COMPLEXITY: Low     GOALS: Goals reviewed with patient? Yes  04/13/2024     SHORT TERM GOALS: Target date:  Patient will increase passive knee flexion to 128 degrees on the left Baseline: Goal status: achived 10/31  2.  Patient will increase passive left knee extension to full Baseline:  Goal status: Achieved 9/11 3.  Patient will progress off assistive device and ambulate greater than 500 feet without significant antalgic gait Baseline:  Goal status: Still using cane.  Patient reports she does not feel safe off the cane yet 10/31   4.  Patient will be independent with HEP Baseline:  Goal status: Has initial HEP achieved 10/31     LONG TERM GOALS: Target date: POC     Patient will go up and down 12 steps with reciprocal gait pattern without increased pain Baseline:  Goal status: stairs have been limited by her back     2.  Patient will squat pick items up off the ground without increased pain Baseline:  Goal status: Working on squats but not achieved 10/31 2nd to sciatica    3.  Patient will ambulate community distances without pain Baseline:  Goal status: Still using cane somewhat 10/31    4.  Patient will have complete HEP to promote strengthening Baseline:  Goal status: Continue to progress towards end of HEP  for knee 10/31      PLAN:   PT FREQUENCY: 2x/week   PT DURATION: 8 weeks   PLANNED INTERVENTIONS: 97110-Therapeutic exercises, 97530- Therapeutic activity, 97112- Neuromuscular re-education, 97535- Self Care, [id number]- Manual therapy, [id number]- Gait training, [id number]- Aquatic Therapy, [id number]- , [id number]- Ultrasound, Patient/Family education, Stair training, Dry Needling, DME instructions, Cryotherapy, and Moist heat    PLAN FOR NEXT SESSION:  Progressed to functional strengthening such as squats and steps as tolerated. Re-assess back if prescribed   Alm Don PT DPT 08/03/24 3:29 PM [facility]  [facility] [address] [place], [place], [address] Phone: [phone]   Fax:  [phone]          Referring diagnosis? Left TKA  Treatment diagnosis? (if different than referring diagnosis) LEft knee stiffness  Left knee pain  What was this (referring dx) caused by? [x]  Surgery []  Fall []  Ongoing issue []  Arthritis []  Other: ____________  Laterality: []  Rt [x]  Lt []  Both  Check all possible CPT codes:  *CHOOSE 10 OR LESS*    See Planned Interventions listed in the Plan section of the Evaluation.

## 2024-08-05 ENCOUNTER — Encounter (HOSPITAL_BASED_OUTPATIENT_CLINIC_OR_DEPARTMENT_OTHER): Payer: Self-pay | Admitting: Physical Therapy

## 2024-08-05 ENCOUNTER — Ambulatory Visit (HOSPITAL_BASED_OUTPATIENT_CLINIC_OR_DEPARTMENT_OTHER): Admitting: Physical Therapy

## 2024-08-05 DIAGNOSIS — R2689 Other abnormalities of gait and mobility: Secondary | ICD-10-CM

## 2024-08-05 DIAGNOSIS — M25562 Pain in left knee: Secondary | ICD-10-CM

## 2024-08-05 DIAGNOSIS — M6281 Muscle weakness (generalized): Secondary | ICD-10-CM

## 2024-08-05 NOTE — Therapy (Signed)
 [facility]    OUTPATIENT PHYSICAL THERAPY LOWER EXTREMITY TREATMENT     Patient Name: Latasha [name] MRN: [id number] DOB:05-14-1961, 63 y.o., female Today's Date: 03/14/2024   END OF SESSION:    PT End of Session - 08/05/24 1046     Visit Number 32    Number of Visits 45    Date for Recertification  08/27/24    Authorization Type [employer] mcr Progress note to be done at 26    Authorization Time Period 10 visits 11/29  to 12/26                      Patient Active Problem List    Diagnosis Date Noted   History of total knee arthroplasty, left 03/01/2024   Tubular adenoma 02/25/2024   Severe obesity (BMI 35.0-39.9) with comorbidity (HCC) 10/12/2023   Pulmonary nodule 08/21/2023   Hx of total hip arthroplasty, right 10/03/2021   De Quervain's tenosynovitis, right 09/06/2020   B12 deficiency 07/13/2020   Family history of colon cancer 12/01/2017   Benign essential hypertension 03/01/2016   Herpes zoster without complication 03/01/2016   BP (high blood pressure) 09/05/2015   Osteoporosis, post-menopausal 09/05/2015   Postsurgical menopause 09/05/2015   Class 1 obesity 09/05/2015   GERD (gastroesophageal reflux disease) 09/05/2015   Endometriosis 09/05/2015   Status post total hysterectomy and bilateral salpingo-oophorectomy 09/05/2015   Primary osteoarthritis of both knees 09/28/2014    PCP: Oneil Pinal MD    REFERRING PROVIDER: Fonda Koyanagi  PA-C  Hooten, Lynwood Idalia Raddle., MD (surgeon)   REFERRING DIAG: Left TKA    THERAPY DIAG:  No diagnosis found.   Rationale for Evaluation and Treatment: Rehabilitation   ONSET DATE: DOS  03/01/2024   SUBJECTIVE:    SUBJECTIVE STATEMENT:   The patient  reports significant pain in her right knee today. She feels like it may be weather related.   Eval: Pt received PT from 04-02/2024 for bilat knee OA.  She received aquatic therapy  Pt underwent L TKA on 03/01/2024.  At this plan her pain is  well-controlled.  She has been at home health and done well.  She did not use the cane some at home.  She practiced using the cane at home with home health.  She is using a walker outside the house.  She has an exercise program that she has been working.  Will review the exercise program today.  Her chief complaint at this time is difficulty sleeping.  She was advised that this is normal at this point.      PERTINENT HISTORY: L TKA on 6/302025 OA R knee and L shoulder. Bilat shoulder pain R THA in 2023 HTN Osteoporosis Anxiety Anemia Chronic fatigue PAIN:  Are you having pain? Yes: NPRS scale: 0/10 Pain location:  Pain description:  Aggravating factors:  Relieving factors:     PRECAUTIONS: None   RED FLAGS: None           WEIGHT BEARING RESTRICTIONS: No   FALLS:  Has patient fallen in last 6 months? No   LIVING ENVIRONMENT: 5 steps into the house   OCCUPATION:  Retired    PLOF: Independent   PATIENT GOALS:  To be able to her mother son dance by October at her sons wedding/ to be able to walk      OBJECTIVE:  Note: Objective measures were completed at Evaluation unless otherwise noted.   DIAGNOSTIC FINDINGS:  Nothing post op  PATIENT SURVEYS:  LEFS  Extreme difficulty/unable (0), Quite a bit of difficulty (1), Moderate difficulty (2), Little difficulty (3), No difficulty (4) Survey date:  eval 8/28 10/31  Any of your usual work, housework or school activities  4   2. Usual hobbies, recreational or sporting activities  3   3. Getting into/out of the bath  3   4. Walking between rooms  3   5. Putting on socks/shoes  3   6. Squatting   3   7. Lifting an object, like a bag of groceries from the floor  2   8. Performing light activities around your home  1   9. Performing heavy activities around your home  2   10. Getting into/out of a car  3   11. Walking 2 blocks  1   12. Walking 1 mile  1   13. Going up/down 10 stairs (1 flight)  1   14. Standing for 1  hour  1   15.  sitting for 1 hour  1   16. Running on even ground  1   17. Running on uneven ground  1   18. Making sharp turns while running fast  1   19. Hopping   1   20. Rolling over in bed  2   Score total:  23/80 38/80 47/80       COGNITION: Overall cognitive status: Within functional limits for tasks assessed                               SENSATION:   EDEMA:  Circumferential: 44.5 R  48.7 L    MUSCLE LENGTH:   POSTURE: rounded shoulders and forward head   PALPATION: No unexpected TTP    LOWER EXTREMITY ROM:   Passive ROM Right eval Left eval left left Left  8/11  L  8/18 Left  10/31  Hip flexion           Hip extension           Hip abduction           Hip adduction           Hip internal rotation           Hip external rotation           Knee flexion   96  100 103 118  123 125  Knee extension    -3 -3      Ankle dorsiflexion           Ankle plantarflexion           Ankle inversion           Ankle eversion            (Blank rows = not tested)   LOWER EXTREMITY MMT:   MMT (lb) Right 8/21 Left 8/21 Right  Left  Left  Right  10/31 Left 10/31  Hip flexion     4 4+ 4+ 27.0 27.5  Hip extension           Hip abduction     4 4+ 4+ 32.9 33.9  Hip adduction           Hip internal rotation           Hip external rotation           Knee flexion  35.3 38.8        Knee  extension (age norm 27lb)   46.0 46.1 46.0 ( from previous visit)  46.1 ( from previous visit)   32.7 47.1  Ankle dorsiflexion           Ankle plantarflexion           Ankle inversion           Ankle eversion            (Blank rows = not tested)       GAIT: Decreased left hip flexion Forward trunk flexion  Decreased left single leg                                                                                                                                    TREATMENT DATE:   [facility]  Treatment:    12/4 Manual: Edema massage to right knee  Trigger point release to IT  band   SAQ 3x12 3lbs Quad set 3x12 bilateral  Ball stretch for lower back 10 x 5 sec hold  Lateral stretch 5x each side 5 sec hold  Step up:  6 inch 2x10  Lateral step  up 2x10   12/2 Manual: Edema massage to right knee  Trigger point release to IT band     SAQ 3x12 3lbs  LAQ 3x12 2lbs  Bilateral hip abduction 3x10 Green     11/29  There-ex:   SAQ 3x12 3lbs  LAQ 3x12 2lbs  Bilateral hip abduction 3x10 red  Standing weight shift x10 but caused pain on the right side   LTR 2x10    There-act:  Step up 4 inch 3x10 L only  Side step 3x10 4 inch  10/31 Manual: Trigger point release to QL Trigger point release to gluteal  Trigger point release to lower lumbar spine  There-ex:   SAQ 3x12 3lbs  LAQ 3x12 2lbs  Bilateral hip abduction 3x10 red  PROM into flexion   Reviewed strength and goals.    10/24 Manual: Scar tissue release to scar Trigger point release to lateral knee PROM into extension with distraction of the knee   Trigger point release to QL Trigger point release to gluteal  Trigger point release to lower lumbar spine  There-ex:  Quad set 3x15  SAQ 3x12 3lbs  LAQ 3x12  Bilateral hip abduction 3x10 red    Date: 06/23/24 Pt seen for aquatic therapy today.  Treatment took place in water 3.5-4.75 ft in depth at the [facility] pool. Temp of water was 91.  Pt entered/exited the pool via stairs independently with bil rail.  - unsupported walking forward/ backward, multiple laps - unsupported side stepping -> with arm add/abdct with rainbow hand floats-> lunge -  forward walking kick ue support RBHB -knee flex/ext CKC straddled on hollow noodle-> walking across 3.6 ft in same position forward and back - STS from 3rd step unsupported x 10. Good execution.->from 4th from bottom step 2  x 5 (completed well, good challenge) -hamstring and gastroc stretching -Sitting balance on yellow noodle in 3.6 ft (good challenge) lifting alternating  ue.   10/17 Manual: Scar tissue release to scar Trigger pint release to lateral knee PROM into extension with distraction   There-ex:  PROM into flexion  Quad set 3x12  SAQ 1.5 lbs 3x12  LAQ 3x12 1.5 lbs   Strength and ROM measurements and review of goals   Date: 06/15/24 Pt seen for aquatic therapy today.  Treatment took place in water 3.5-4.75 ft in depth at the [facility] pool. Temp of water was 91.  Pt entered/exited the pool via stairs independently with bil rail.  - unsupported walking forward/ backward, multiple laps - unsupported side stepping -> with arm add/abdct with rainbow hand floats-> lunge -  forward walking kick ue support RBHB - STS at bench in water, with forward RUE reach and slow controlled descent, x 12(easy, no LOB) -SL STS from bench onto floor x 10 (no LOB) -hamstring and gastroc stretching - SLS right 2 x 20s hold->dynamic-ue add/abd x 5 (2 LOB) -Sitting balance on yellow noodle in 3.6 ft (good challenge) -STS from 3rd step x 5-> x 5 with add set using BB   06/11/24  Bike L4x6 minutes for w/u   Kallie- attempted limited by HS cramping  HS isometric digs x10 B Figure 4 stretch 2x30 seconds B (modified on R)  LAQs 3# 2x10 L  Seated TA set + alternating march x20 Seated TA set + clamshell green TB x12 STS green TB around knees x10 from mat table        Treatment:                                             Date: 06/08/24 Pt seen for aquatic therapy today.  Treatment took place in water 3.5-4.75 ft in depth at the [facility] pool. Temp of water was 91.  Pt entered/exited the pool via stairs independently with bil rail.  - unsupported walking forward/ backward, multiple laps - unsupported side stepping -> with arm add/abdct with rainbow hand floats-> lunge -  forward walking kick ue support RBHB - SLS right 2 x 20s hold->dynamic-ue add/abd x 5 (2 LOB) - STS at bench in water, with forward RUE reach and slow controlled  descent, x 12 -SL STS from bench onto floor x 5->onto water step x5 (good challenge -Sitting balance on yellow noodle in 3.6 ft (good challenge)     PATIENT EDUCATION:  Education details:   Person educated: Patient Education method: Programmer, Multimedia, Demonstration, Actor cues, Verbal cues Education comprehension: verbalized understanding, returned demonstration, verbal cues required, tactile cues required, and needs further education   HOME EXERCISE PROGRAM: Access Code: [email or web address] URL: [email or web address] Date: 03/22/2024 Prepared by: Harlene Cordon  Exercises - Seated Hamstring Stretch  - 2-3 x daily - 7 x weekly - 2 sets - 5 breaths hold - Seated Heel Slide  - 5 x daily - 7 x weekly - 1 sets - 10 reps - Standing Gluteal Sets  - 5 x daily - 7 x weekly - 1 sets - 10 reps - Standing Weight Shift Side to Side Eyes Closed  - 5 x daily - 7 x weekly - 1 sets - 10 reps - Mini Squat  - 5 x daily - 7 x weekly - 1 sets -  10 reps - Sidelying Hip Abduction  - 3 x daily - 7 x weekly - 3 sets - 10 reps   ASSESSMENT:   CLINICAL IMPRESSION: The patient continues to report improved pain and stiffness in the right knee with manual therapy. It is improving her gait and lateral left knee pain. We wokred on functional strengthening with the left knee. We also reviewed low back stretching.  We will continue to progress functional strengthening as tolerated.  OBJECTIVE IMPAIRMENTS: Abnormal gait, decreased activity tolerance, decreased mobility, difficulty walking, decreased ROM, decreased strength, increased edema, increased muscle spasms, and pain.    ACTIVITY LIMITATIONS: carrying, lifting, bending, standing, squatting, stairs, transfers, bed mobility, bathing, and locomotion level   PARTICIPATION LIMITATIONS: meal prep, cleaning, laundry, driving, shopping, community activity, occupation, and yard work   PERSONAL FACTORS: 1-2 comorbidities: hip OA  are also affecting patient's  functional outcome.    REHAB POTENTIAL: Good   CLINICAL DECISION MAKING: Stable/uncomplicated   EVALUATION COMPLEXITY: Low     GOALS: Goals reviewed with patient? Yes  04/13/2024     SHORT TERM GOALS: Target date:  Patient will increase passive knee flexion to 128 degrees on the left Baseline: Goal status: achived 10/31  2.  Patient will increase passive left knee extension to full Baseline:  Goal status: Achieved 9/11 3.  Patient will progress off assistive device and ambulate greater than 500 feet without significant antalgic gait Baseline:  Goal status: Still using cane.  Patient reports she does not feel safe off the cane yet 10/31   4.  Patient will be independent with HEP Baseline:  Goal status: Has initial HEP achieved 10/31     LONG TERM GOALS: Target date: POC     Patient will go up and down 12 steps with reciprocal gait pattern without increased pain Baseline:  Goal status: stairs have been limited by her back     2.  Patient will squat pick items up off the ground without increased pain Baseline:  Goal status: Working on squats but not achieved 10/31 2nd to sciatica    3.  Patient will ambulate community distances without pain Baseline:  Goal status: Still using cane somewhat 10/31    4.  Patient will have complete HEP to promote strengthening Baseline:  Goal status: Continue to progress towards end of HEP  for knee 10/31      PLAN:   PT FREQUENCY: 2x/week   PT DURATION: 8 weeks   PLANNED INTERVENTIONS: 97110-Therapeutic exercises, 97530- Therapeutic activity, 97112- Neuromuscular re-education, 97535- Self Care, [id number]- Manual therapy, [id number]- Gait training, [phone]- Aquatic Therapy, [id number]- , [id number]- Ultrasound, Patient/Family education, Stair training, Dry Needling, DME instructions, Cryotherapy, and Moist heat    PLAN FOR NEXT SESSION:  Progressed to functional strengthening such as squats and steps as tolerated. Re-assess back if prescribed    Alm Don PT DPT 08/05/24 10:47 AM [facility] [address] [place], [place], [address] Phone: [phone]   Fax:  [phone]          Referring diagnosis? Left TKA  Treatment diagnosis? (if different than referring diagnosis) LEft knee stiffness  Left knee pain  What was this (referring dx) caused by? [x]  Surgery []  Fall []  Ongoing issue []  Arthritis []  Other: ____________  Laterality: []  Rt [x]  Lt []  Both  Check all possible CPT codes:  *CHOOSE 10 OR LESS*    See Planned Interventions listed in the Plan section of the Evaluation.

## 2024-08-12 ENCOUNTER — Ambulatory Visit (HOSPITAL_BASED_OUTPATIENT_CLINIC_OR_DEPARTMENT_OTHER)

## 2024-08-12 ENCOUNTER — Encounter (HOSPITAL_BASED_OUTPATIENT_CLINIC_OR_DEPARTMENT_OTHER): Payer: Self-pay

## 2024-08-17 ENCOUNTER — Ambulatory Visit (HOSPITAL_BASED_OUTPATIENT_CLINIC_OR_DEPARTMENT_OTHER)

## 2024-08-17 ENCOUNTER — Encounter (HOSPITAL_BASED_OUTPATIENT_CLINIC_OR_DEPARTMENT_OTHER): Payer: Self-pay

## 2024-08-17 DIAGNOSIS — R2689 Other abnormalities of gait and mobility: Secondary | ICD-10-CM

## 2024-08-17 DIAGNOSIS — M25562 Pain in left knee: Secondary | ICD-10-CM

## 2024-08-17 NOTE — Therapy (Signed)
 [facility]    OUTPATIENT PHYSICAL THERAPY LOWER EXTREMITY TREATMENT     Patient Name: Latasha [name] MRN: [id number] DOB:March 13, 1961, 63 y.o., female Today's Date: 03/14/2024   END OF SESSION:    PT End of Session - 08/17/24 1152     Visit Number 33    Number of Visits 45    Date for Recertification  08/27/24    Authorization Type [employer] mcr Progress note to be done at 26    Authorization Time Period 10 visits 11/29  to 12/26    PT Start Time 1150    PT Stop Time 1230    PT Time Calculation (min) 40 min    Activity Tolerance Patient tolerated treatment well    Behavior During Therapy [facility] for tasks assessed/performed                       Patient Active Problem List    Diagnosis Date Noted   History of total knee arthroplasty, left 03/01/2024   Tubular adenoma 02/25/2024   Severe obesity (BMI 35.0-39.9) with comorbidity (HCC) 10/12/2023   Pulmonary nodule 08/21/2023   Hx of total hip arthroplasty, right 10/03/2021   De Quervain's tenosynovitis, right 09/06/2020   B12 deficiency 07/13/2020   Family history of colon cancer 12/01/2017   Benign essential hypertension 03/01/2016   Herpes zoster without complication 03/01/2016   BP (high blood pressure) 09/05/2015   Osteoporosis, post-menopausal 09/05/2015   Postsurgical menopause 09/05/2015   Class 1 obesity 09/05/2015   GERD (gastroesophageal reflux disease) 09/05/2015   Endometriosis 09/05/2015   Status post total hysterectomy and bilateral salpingo-oophorectomy 09/05/2015   Primary osteoarthritis of both knees 09/28/2014    PCP: Oneil Pinal MD    REFERRING PROVIDER: Fonda Koyanagi  PA-C  Hooten, Lynwood Idalia Raddle., MD (surgeon)   REFERRING DIAG: Left TKA    THERAPY DIAG:  No diagnosis found.   Rationale for Evaluation and Treatment: Rehabilitation   ONSET DATE: DOS  03/01/2024   SUBJECTIVE:    SUBJECTIVE STATEMENT:   Pt reports increased pain in L hip as well as increased  sciatica. Also having shoulder pain.   Eval: Pt received PT from 04-02/2024 for bilat knee OA.  She received aquatic therapy  Pt underwent L TKA on 03/01/2024.  At this plan her pain is well-controlled.  She has been at home health and done well.  She did not use the cane some at home.  She practiced using the cane at home with home health.  She is using a walker outside the house.  She has an exercise program that she has been working.  Will review the exercise program today.  Her chief complaint at this time is difficulty sleeping.  She was advised that this is normal at this point.      PERTINENT HISTORY: L TKA on 6/302025 OA R knee and L shoulder. Bilat shoulder pain R THA in 2023 HTN Osteoporosis Anxiety Anemia Chronic fatigue PAIN:  Are you having pain? Yes: NPRS scale: 4/10 Pain location: L knee Pain description:  Aggravating factors:  Relieving factors:     PRECAUTIONS: None   RED FLAGS: None           WEIGHT BEARING RESTRICTIONS: No   FALLS:  Has patient fallen in last 6 months? No   LIVING ENVIRONMENT: 5 steps into the house   OCCUPATION:  Retired    PLOF: Independent   PATIENT GOALS:  To be able to her  mother son dance by October at her sons wedding/ to be able to walk      OBJECTIVE:  Note: Objective measures were completed at Evaluation unless otherwise noted.   DIAGNOSTIC FINDINGS:  Nothing post op    PATIENT SURVEYS:  LEFS  Extreme difficulty/unable (0), Quite a bit of difficulty (1), Moderate difficulty (2), Little difficulty (3), No difficulty (4) Survey date:  eval 8/28 10/31  Any of your usual work, housework or school activities  4   2. Usual hobbies, recreational or sporting activities  3   3. Getting into/out of the bath  3   4. Walking between rooms  3   5. Putting on socks/shoes  3   6. Squatting   3   7. Lifting an object, like a bag of groceries from the floor  2   8. Performing light activities around your home  1   9.  Performing heavy activities around your home  2   10. Getting into/out of a car  3   11. Walking 2 blocks  1   12. Walking 1 mile  1   13. Going up/down 10 stairs (1 flight)  1   14. Standing for 1 hour  1   15.  sitting for 1 hour  1   16. Running on even ground  1   17. Running on uneven ground  1   18. Making sharp turns while running fast  1   19. Hopping   1   20. Rolling over in bed  2   Score total:  23/80 38/80 47/80       COGNITION: Overall cognitive status: Within functional limits for tasks assessed                               SENSATION:   EDEMA:  Circumferential: 44.5 R  48.7 L    MUSCLE LENGTH:   POSTURE: rounded shoulders and forward head   PALPATION: No unexpected TTP    LOWER EXTREMITY ROM:   Passive ROM Right eval Left eval left left Left  8/11  L  8/18 Left  10/31  Hip flexion           Hip extension           Hip abduction           Hip adduction           Hip internal rotation           Hip external rotation           Knee flexion   96  100 103 118  123 125  Knee extension    -3 -3      Ankle dorsiflexion           Ankle plantarflexion           Ankle inversion           Ankle eversion            (Blank rows = not tested)   LOWER EXTREMITY MMT:   MMT (lb) Right 8/21 Left 8/21 Right  Left  Left  Right  10/31 Left 10/31  Hip flexion     4 4+ 4+ 27.0 27.5  Hip extension           Hip abduction     4 4+ 4+ 32.9 33.9  Hip adduction  Hip internal rotation           Hip external rotation           Knee flexion  35.3 38.8        Knee extension (age norm 60lb)   46.0 46.1 46.0 ( from previous visit)  46.1 ( from previous visit)   32.7 47.1  Ankle dorsiflexion           Ankle plantarflexion           Ankle inversion           Ankle eversion            (Blank rows = not tested)       GAIT: Decreased left hip flexion Forward trunk flexion  Decreased left single leg                                                                                                                                     TREATMENT DATE:   [facility] Adult PT  Treatment:     12/16 Seated HSS with stool- 30sec x3ea Standing calf stretch 20sec x3ea Nu step L5 x40min (LE's only) Standing HSC 2x10 ea Standing hip abd/ext 3x10ea bil Standing hip hikes x10ea ITB stretch at wall 2x20sec Step ups 2x10 bottom step L only LTR 5 x10 ea   12/4 Manual: Edema massage to right knee  Trigger point release to IT band   SAQ 3x12 3lbs Quad set 3x12 bilateral  Ball stretch for lower back 10 x 5 sec hold  Lateral stretch 5x each side 5 sec hold  Step up:  6 inch 2x10  Lateral step  up 2x10   12/2 Manual: Edema massage to right knee  Trigger point release to IT band     SAQ 3x12 3lbs  LAQ 3x12 2lbs  Bilateral hip abduction 3x10 Green     11/29  There-ex:   SAQ 3x12 3lbs  LAQ 3x12 2lbs  Bilateral hip abduction 3x10 red  Standing weight shift x10 but caused pain on the right side   LTR 2x10    There-act:  Step up 4 inch 3x10 L only  Side step 3x10 4 inch  10/31 Manual: Trigger point release to QL Trigger point release to gluteal  Trigger point release to lower lumbar spine  There-ex:   SAQ 3x12 3lbs  LAQ 3x12 2lbs  Bilateral hip abduction 3x10 red  PROM into flexion   Reviewed strength and goals.    10/24 Manual: Scar tissue release to scar Trigger point release to lateral knee PROM into extension with distraction of the knee   Trigger point release to QL Trigger point release to gluteal  Trigger point release to lower lumbar spine  There-ex:  Quad set 3x15  SAQ 3x12 3lbs  LAQ 3x12  Bilateral hip abduction 3x10 red    Date: 06/23/24 Pt seen for aquatic therapy today.  Treatment took  place in water 3.5-4.75 ft in depth at the [facility] pool. Temp of water was 91.  Pt entered/exited the pool via stairs independently with bil rail.  - unsupported walking forward/ backward, multiple  laps - unsupported side stepping -> with arm add/abdct with rainbow hand floats-> lunge -  forward walking kick ue support RBHB -knee flex/ext CKC straddled on hollow noodle-> walking across 3.6 ft in same position forward and back - STS from 3rd step unsupported x 10. Good execution.->from 4th from bottom step 2 x 5 (completed well, good challenge) -hamstring and gastroc stretching -Sitting balance on yellow noodle in 3.6 ft (good challenge) lifting alternating ue.   10/17 Manual: Scar tissue release to scar Trigger pint release to lateral knee PROM into extension with distraction   There-ex:  PROM into flexion  Quad set 3x12  SAQ 1.5 lbs 3x12  LAQ 3x12 1.5 lbs   Strength and ROM measurements and review of goals   Date: 06/15/24 Pt seen for aquatic therapy today.  Treatment took place in water 3.5-4.75 ft in depth at the [facility] pool. Temp of water was 91.  Pt entered/exited the pool via stairs independently with bil rail.  - unsupported walking forward/ backward, multiple laps - unsupported side stepping -> with arm add/abdct with rainbow hand floats-> lunge -  forward walking kick ue support RBHB - STS at bench in water, with forward RUE reach and slow controlled descent, x 12(easy, no LOB) -SL STS from bench onto floor x 10 (no LOB) -hamstring and gastroc stretching - SLS right 2 x 20s hold->dynamic-ue add/abd x 5 (2 LOB) -Sitting balance on yellow noodle in 3.6 ft (good challenge) -STS from 3rd step x 5-> x 5 with add set using BB   06/11/24  Bike L4x6 minutes for w/u   Kallie- attempted limited by HS cramping  HS isometric digs x10 B Figure 4 stretch 2x30 seconds B (modified on R)  LAQs 3# 2x10 L  Seated TA set + alternating march x20 Seated TA set + clamshell green TB x12 STS green TB around knees x10 from mat table        Treatment:                                             Date: 06/08/24 Pt seen for aquatic therapy today.  Treatment took  place in water 3.5-4.75 ft in depth at the [facility] pool. Temp of water was 91.  Pt entered/exited the pool via stairs independently with bil rail.  - unsupported walking forward/ backward, multiple laps - unsupported side stepping -> with arm add/abdct with rainbow hand floats-> lunge -  forward walking kick ue support RBHB - SLS right 2 x 20s hold->dynamic-ue add/abd x 5 (2 LOB) - STS at bench in water, with forward RUE reach and slow controlled descent, x 12 -SL STS from bench onto floor x 5->onto water step x5 (good challenge -Sitting balance on yellow noodle in 3.6 ft (good challenge)     PATIENT EDUCATION:  Education details:   Person educated: Patient Education method: Programmer, Multimedia, Demonstration, Actor cues, Verbal cues Education comprehension: verbalized understanding, returned demonstration, verbal cues required, tactile cues required, and needs further education   HOME EXERCISE PROGRAM: Access Code: [email or web address] URL: [email or web address] Date: 03/22/2024 Prepared by: Harlene Cordon  Exercises - Seated Hamstring Stretch  - 2-3 x daily -  7 x weekly - 2 sets - 5 breaths hold - Seated Heel Slide  - 5 x daily - 7 x weekly - 1 sets - 10 reps - Standing Gluteal Sets  - 5 x daily - 7 x weekly - 1 sets - 10 reps - Standing Weight Shift Side to Side Eyes Closed  - 5 x daily - 7 x weekly - 1 sets - 10 reps - Mini Squat  - 5 x daily - 7 x weekly - 1 sets - 10 reps - Sidelying Hip Abduction  - 3 x daily - 7 x weekly - 3 sets - 10 reps   ASSESSMENT:   CLINICAL IMPRESSION: Pt had good tolerance for standing tasks. Felt improved pain level following stretches today including calf, HS, and ITB stretches. Cues for glute activation with step ups leading with L LE only.  Instructed pt in home performance. Discussed HEP frequency and correct performance. Improved pain level by end of session. Will continue to progress as tolerated.    OBJECTIVE IMPAIRMENTS:  Abnormal gait, decreased activity tolerance, decreased mobility, difficulty walking, decreased ROM, decreased strength, increased edema, increased muscle spasms, and pain.    ACTIVITY LIMITATIONS: carrying, lifting, bending, standing, squatting, stairs, transfers, bed mobility, bathing, and locomotion level   PARTICIPATION LIMITATIONS: meal prep, cleaning, laundry, driving, shopping, community activity, occupation, and yard work   PERSONAL FACTORS: 1-2 comorbidities: hip OA  are also affecting patient's functional outcome.    REHAB POTENTIAL: Good   CLINICAL DECISION MAKING: Stable/uncomplicated   EVALUATION COMPLEXITY: Low     GOALS: Goals reviewed with patient? Yes  04/13/2024     SHORT TERM GOALS: Target date:  Patient will increase passive knee flexion to 128 degrees on the left Baseline: Goal status: achived 10/31  2.  Patient will increase passive left knee extension to full Baseline:  Goal status: Achieved 9/11 3.  Patient will progress off assistive device and ambulate greater than 500 feet without significant antalgic gait Baseline:  Goal status: Still using cane.  Patient reports she does not feel safe off the cane yet 10/31   4.  Patient will be independent with HEP Baseline:  Goal status: Has initial HEP achieved 10/31     LONG TERM GOALS: Target date: POC     Patient will go up and down 12 steps with reciprocal gait pattern without increased pain Baseline:  Goal status: stairs have been limited by her back     2.  Patient will squat pick items up off the ground without increased pain Baseline:  Goal status: Working on squats but not achieved 10/31 2nd to sciatica    3.  Patient will ambulate community distances without pain Baseline:  Goal status: Still using cane somewhat 10/31    4.  Patient will have complete HEP to promote strengthening Baseline:  Goal status: Continue to progress towards end of HEP  for knee 10/31      PLAN:   PT  FREQUENCY: 2x/week   PT DURATION: 8 weeks   PLANNED INTERVENTIONS: 97110-Therapeutic exercises, 97530- Therapeutic activity, 97112- Neuromuscular re-education, 97535- Self Care, [id number]- Manual therapy, [id number]- Gait training, [phone]- Aquatic Therapy, [id number]- , [id number]- Ultrasound, Patient/Family education, Stair training, Dry Needling, DME instructions, Cryotherapy, and Moist heat    PLAN FOR NEXT SESSION:  Progressed to functional strengthening such as squats and steps as tolerated. Re-assess back if prescribed    Asberry FORBES Rodes, PTA 08/17/2024, 3:45 PM  Referring diagnosis? Left TKA  Treatment diagnosis? (if different than referring diagnosis) LEft knee stiffness  Left knee pain  What was this (referring dx) caused by? [x]  Surgery []  Fall []  Ongoing issue []  Arthritis []  Other: ____________  Laterality: []  Rt [x]  Lt []  Both  Check all possible CPT codes:  *CHOOSE 10 OR LESS*    See Planned Interventions listed in the Plan section of the Evaluation.

## 2024-08-17 NOTE — Therapy (Deleted)
 [facility]    OUTPATIENT PHYSICAL THERAPY LOWER EXTREMITY TREATMENT     Patient Name: Latasha [name] MRN: [id number] DOB:1961-01-09, 63 y.o., female Today's Date: 03/14/2024   END OF SESSION:                  Patient Active Problem List    Diagnosis Date Noted   History of total knee arthroplasty, left 03/01/2024   Tubular adenoma 02/25/2024   Severe obesity (BMI 35.0-39.9) with comorbidity (HCC) 10/12/2023   Pulmonary nodule 08/21/2023   Hx of total hip arthroplasty, right 10/03/2021   De Quervain's tenosynovitis, right 09/06/2020   B12 deficiency 07/13/2020   Family history of colon cancer 12/01/2017   Benign essential hypertension 03/01/2016   Herpes zoster without complication 03/01/2016   BP (high blood pressure) 09/05/2015   Osteoporosis, post-menopausal 09/05/2015   Postsurgical menopause 09/05/2015   Class 1 obesity 09/05/2015   GERD (gastroesophageal reflux disease) 09/05/2015   Endometriosis 09/05/2015   Status post total hysterectomy and bilateral salpingo-oophorectomy 09/05/2015   Primary osteoarthritis of both knees 09/28/2014    PCP: Oneil Pinal MD    REFERRING PROVIDER: Fonda Koyanagi  PA-C  Hooten, Lynwood Idalia Raddle., MD (surgeon)   REFERRING DIAG: Left TKA    THERAPY DIAG:  No diagnosis found.   Rationale for Evaluation and Treatment: Rehabilitation   ONSET DATE: DOS  03/01/2024   SUBJECTIVE:    SUBJECTIVE STATEMENT:   The patient  reports significant pain in her right knee today. She feels like it may be weather related.   Eval: Pt received PT from 04-02/2024 for bilat knee OA.  She received aquatic therapy  Pt underwent L TKA on 03/01/2024.  At this plan her pain is well-controlled.  She has been at home health and done well.  She did not use the cane some at home.  She practiced using the cane at home with home health.  She is using a walker outside the house.  She has an exercise program that she has been working.   Will review the exercise program today.  Her chief complaint at this time is difficulty sleeping.  She was advised that this is normal at this point.      PERTINENT HISTORY: L TKA on 6/302025 OA R knee and L shoulder. Bilat shoulder pain R THA in 2023 HTN Osteoporosis Anxiety Anemia Chronic fatigue PAIN:  Are you having pain? Yes: NPRS scale: 0/10 Pain location:  Pain description:  Aggravating factors:  Relieving factors:     PRECAUTIONS: None   RED FLAGS: None           WEIGHT BEARING RESTRICTIONS: No   FALLS:  Has patient fallen in last 6 months? No   LIVING ENVIRONMENT: 5 steps into the house   OCCUPATION:  Retired    PLOF: Independent   PATIENT GOALS:  To be able to her mother son dance by October at her sons wedding/ to be able to walk      OBJECTIVE:  Note: Objective measures were completed at Evaluation unless otherwise noted.   DIAGNOSTIC FINDINGS:  Nothing post op    PATIENT SURVEYS:  LEFS  Extreme difficulty/unable (0), Quite a bit of difficulty (1), Moderate difficulty (2), Little difficulty (3), No difficulty (4) Survey date:  eval 8/28 10/31  Any of your usual work, housework or school activities  4   2. Usual hobbies, recreational or sporting activities  3   3. Getting into/out of the bath  3   4. Walking between rooms  3   5. Putting on socks/shoes  3   6. Squatting   3   7. Lifting an object, like a bag of groceries from the floor  2   8. Performing light activities around your home  1   9. Performing heavy activities around your home  2   10. Getting into/out of a car  3   11. Walking 2 blocks  1   12. Walking 1 mile  1   13. Going up/down 10 stairs (1 flight)  1   14. Standing for 1 hour  1   15.  sitting for 1 hour  1   16. Running on even ground  1   17. Running on uneven ground  1   18. Making sharp turns while running fast  1   19. Hopping   1   20. Rolling over in bed  2   Score total:  23/80 38/80 47/80        COGNITION: Overall cognitive status: Within functional limits for tasks assessed                               SENSATION:   EDEMA:  Circumferential: 44.5 R  48.7 L    MUSCLE LENGTH:   POSTURE: rounded shoulders and forward head   PALPATION: No unexpected TTP    LOWER EXTREMITY ROM:   Passive ROM Right eval Left eval left left Left  8/11  L  8/18 Left  10/31  Hip flexion           Hip extension           Hip abduction           Hip adduction           Hip internal rotation           Hip external rotation           Knee flexion   96  100 103 118  123 125  Knee extension    -3 -3      Ankle dorsiflexion           Ankle plantarflexion           Ankle inversion           Ankle eversion            (Blank rows = not tested)   LOWER EXTREMITY MMT:   MMT (lb) Right 8/21 Left 8/21 Right  Left  Left  Right  10/31 Left 10/31  Hip flexion     4 4+ 4+ 27.0 27.5  Hip extension           Hip abduction     4 4+ 4+ 32.9 33.9  Hip adduction           Hip internal rotation           Hip external rotation           Knee flexion  35.3 38.8        Knee extension (age norm 60lb)   46.0 46.1 46.0 ( from previous visit)  46.1 ( from previous visit)   32.7 47.1  Ankle dorsiflexion           Ankle plantarflexion           Ankle inversion           Ankle  eversion            (Blank rows = not tested)       GAIT: Decreased left hip flexion Forward trunk flexion  Decreased left single leg                                                                                                                                    TREATMENT DATE:   [facility]  Treatment:    12/4 Manual: Edema massage to right knee  Trigger point release to IT band   SAQ 3x12 3lbs Quad set 3x12 bilateral  Ball stretch for lower back 10 x 5 sec hold  Lateral stretch 5x each side 5 sec hold  Step up:  6 inch 2x10  Lateral step  up 2x10   12/2 Manual: Edema massage to right knee  Trigger  point release to IT band     SAQ 3x12 3lbs  LAQ 3x12 2lbs  Bilateral hip abduction 3x10 Green     11/29  There-ex:   SAQ 3x12 3lbs  LAQ 3x12 2lbs  Bilateral hip abduction 3x10 red  Standing weight shift x10 but caused pain on the right side   LTR 2x10    There-act:  Step up 4 inch 3x10 L only  Side step 3x10 4 inch  10/31 Manual: Trigger point release to QL Trigger point release to gluteal  Trigger point release to lower lumbar spine  There-ex:   SAQ 3x12 3lbs  LAQ 3x12 2lbs  Bilateral hip abduction 3x10 red  PROM into flexion   Reviewed strength and goals.    10/24 Manual: Scar tissue release to scar Trigger point release to lateral knee PROM into extension with distraction of the knee   Trigger point release to QL Trigger point release to gluteal  Trigger point release to lower lumbar spine  There-ex:  Quad set 3x15  SAQ 3x12 3lbs  LAQ 3x12  Bilateral hip abduction 3x10 red    Date: 06/23/24 Pt seen for aquatic therapy today.  Treatment took place in water 3.5-4.75 ft in depth at the [facility] pool. Temp of water was 91.  Pt entered/exited the pool via stairs independently with bil rail.  - unsupported walking forward/ backward, multiple laps - unsupported side stepping -> with arm add/abdct with rainbow hand floats-> lunge -  forward walking kick ue support RBHB -knee flex/ext CKC straddled on hollow noodle-> walking across 3.6 ft in same position forward and back - STS from 3rd step unsupported x 10. Good execution.->from 4th from bottom step 2 x 5 (completed well, good challenge) -hamstring and gastroc stretching -Sitting balance on yellow noodle in 3.6 ft (good challenge) lifting alternating ue.   10/17 Manual: Scar tissue release to scar Trigger pint release to lateral knee PROM into extension with distraction   There-ex:  PROM into flexion  Quad set 3x12  SAQ 1.5 lbs 3x12  LAQ  3x12 1.5 lbs   Strength and ROM  measurements and review of goals   Date: 06/15/24 Pt seen for aquatic therapy today.  Treatment took place in water 3.5-4.75 ft in depth at the [facility] pool. Temp of water was 91.  Pt entered/exited the pool via stairs independently with bil rail.  - unsupported walking forward/ backward, multiple laps - unsupported side stepping -> with arm add/abdct with rainbow hand floats-> lunge -  forward walking kick ue support RBHB - STS at bench in water, with forward RUE reach and slow controlled descent, x 12(easy, no LOB) -SL STS from bench onto floor x 10 (no LOB) -hamstring and gastroc stretching - SLS right 2 x 20s hold->dynamic-ue add/abd x 5 (2 LOB) -Sitting balance on yellow noodle in 3.6 ft (good challenge) -STS from 3rd step x 5-> x 5 with add set using BB   06/11/24  Bike L4x6 minutes for w/u   Kallie- attempted limited by HS cramping  HS isometric digs x10 B Figure 4 stretch 2x30 seconds B (modified on R)  LAQs 3# 2x10 L  Seated TA set + alternating march x20 Seated TA set + clamshell green TB x12 STS green TB around knees x10 from mat table        Treatment:                                             Date: 06/08/24 Pt seen for aquatic therapy today.  Treatment took place in water 3.5-4.75 ft in depth at the [facility] pool. Temp of water was 91.  Pt entered/exited the pool via stairs independently with bil rail.  - unsupported walking forward/ backward, multiple laps - unsupported side stepping -> with arm add/abdct with rainbow hand floats-> lunge -  forward walking kick ue support RBHB - SLS right 2 x 20s hold->dynamic-ue add/abd x 5 (2 LOB) - STS at bench in water, with forward RUE reach and slow controlled descent, x 12 -SL STS from bench onto floor x 5->onto water step x5 (good challenge -Sitting balance on yellow noodle in 3.6 ft (good challenge)     PATIENT EDUCATION:  Education details:   Person educated: Patient Education  method: Programmer, Multimedia, Demonstration, Actor cues, Verbal cues Education comprehension: verbalized understanding, returned demonstration, verbal cues required, tactile cues required, and needs further education   HOME EXERCISE PROGRAM: Access Code: [email or web address] URL: [email or web address] Date: 03/22/2024 Prepared by: Harlene Cordon  Exercises - Seated Hamstring Stretch  - 2-3 x daily - 7 x weekly - 2 sets - 5 breaths hold - Seated Heel Slide  - 5 x daily - 7 x weekly - 1 sets - 10 reps - Standing Gluteal Sets  - 5 x daily - 7 x weekly - 1 sets - 10 reps - Standing Weight Shift Side to Side Eyes Closed  - 5 x daily - 7 x weekly - 1 sets - 10 reps - Mini Squat  - 5 x daily - 7 x weekly - 1 sets - 10 reps - Sidelying Hip Abduction  - 3 x daily - 7 x weekly - 3 sets - 10 reps   ASSESSMENT:   CLINICAL IMPRESSION: The patient continues to report improved pain and stiffness in the right knee with manual therapy. It is improving her gait and lateral left knee pain. We wokred on functional strengthening with the  left knee. We also reviewed low back stretching.  We will continue to progress functional strengthening as tolerated.  OBJECTIVE IMPAIRMENTS: Abnormal gait, decreased activity tolerance, decreased mobility, difficulty walking, decreased ROM, decreased strength, increased edema, increased muscle spasms, and pain.    ACTIVITY LIMITATIONS: carrying, lifting, bending, standing, squatting, stairs, transfers, bed mobility, bathing, and locomotion level   PARTICIPATION LIMITATIONS: meal prep, cleaning, laundry, driving, shopping, community activity, occupation, and yard work   PERSONAL FACTORS: 1-2 comorbidities: hip OA  are also affecting patient's functional outcome.    REHAB POTENTIAL: Good   CLINICAL DECISION MAKING: Stable/uncomplicated   EVALUATION COMPLEXITY: Low     GOALS: Goals reviewed with patient? Yes  04/13/2024     SHORT TERM GOALS: Target date:  Patient  will increase passive knee flexion to 128 degrees on the left Baseline: Goal status: achived 10/31  2.  Patient will increase passive left knee extension to full Baseline:  Goal status: Achieved 9/11 3.  Patient will progress off assistive device and ambulate greater than 500 feet without significant antalgic gait Baseline:  Goal status: Still using cane.  Patient reports she does not feel safe off the cane yet 10/31   4.  Patient will be independent with HEP Baseline:  Goal status: Has initial HEP achieved 10/31     LONG TERM GOALS: Target date: POC     Patient will go up and down 12 steps with reciprocal gait pattern without increased pain Baseline:  Goal status: stairs have been limited by her back     2.  Patient will squat pick items up off the ground without increased pain Baseline:  Goal status: Working on squats but not achieved 10/31 2nd to sciatica    3.  Patient will ambulate community distances without pain Baseline:  Goal status: Still using cane somewhat 10/31    4.  Patient will have complete HEP to promote strengthening Baseline:  Goal status: Continue to progress towards end of HEP  for knee 10/31      PLAN:   PT FREQUENCY: 2x/week   PT DURATION: 8 weeks   PLANNED INTERVENTIONS: 97110-Therapeutic exercises, 97530- Therapeutic activity, 97112- Neuromuscular re-education, 97535- Self Care, [id number]- Manual therapy, [id number]- Gait training, [phone]- Aquatic Therapy, [id number]- , [id number]- Ultrasound, Patient/Family education, Stair training, Dry Needling, DME instructions, Cryotherapy, and Moist heat    PLAN FOR NEXT SESSION:  Progressed to functional strengthening such as squats and steps as tolerated. Re-assess back if prescribed    Asberry FORBES Rodes, PTA 08/17/2024, 10:51 AM            Referring diagnosis? Left TKA  Treatment diagnosis? (if different than referring diagnosis) LEft knee stiffness  Left knee pain  What was this (referring dx) caused  by? [x]  Surgery []  Fall []  Ongoing issue []  Arthritis []  Other: ____________  Laterality: []  Rt [x]  Lt []  Both  Check all possible CPT codes:  *CHOOSE 10 OR LESS*    See Planned Interventions listed in the Plan section of the Evaluation.

## 2024-08-19 ENCOUNTER — Encounter (HOSPITAL_BASED_OUTPATIENT_CLINIC_OR_DEPARTMENT_OTHER): Payer: Self-pay | Admitting: Physical Therapy

## 2024-08-27 ENCOUNTER — Ambulatory Visit (HOSPITAL_BASED_OUTPATIENT_CLINIC_OR_DEPARTMENT_OTHER): Admitting: Physical Therapy

## 2024-08-27 ENCOUNTER — Encounter (HOSPITAL_BASED_OUTPATIENT_CLINIC_OR_DEPARTMENT_OTHER): Payer: Self-pay | Admitting: Physical Therapy

## 2024-08-27 DIAGNOSIS — R2689 Other abnormalities of gait and mobility: Secondary | ICD-10-CM

## 2024-08-27 DIAGNOSIS — M6281 Muscle weakness (generalized): Secondary | ICD-10-CM

## 2024-08-27 DIAGNOSIS — M25562 Pain in left knee: Secondary | ICD-10-CM

## 2024-08-27 NOTE — Therapy (Signed)
 [facility]    OUTPATIENT PHYSICAL THERAPY LOWER EXTREMITY TREATMENT     Patient Name: Latasha [name] MRN: [id number] DOB:25-Sep-1960, 63 y.o., female Today's Date: 03/14/2024   END OF SESSION:    PT End of Session - 08/27/24 1049     Visit Number 34    Number of Visits 45    Date for Recertification  08/27/24    Authorization Type [employer] mcr Progress note to be done at 26    Authorization Time Period 10 visits 11/29  to 12/26    PT Start Time 1015    PT Stop Time 1058    PT Time Calculation (min) 43 min    Activity Tolerance Patient tolerated treatment well    Behavior During Therapy [facility] for tasks assessed/performed                       Patient Active Problem List    Diagnosis Date Noted   History of total knee arthroplasty, left 03/01/2024   Tubular adenoma 02/25/2024   Severe obesity (BMI 35.0-39.9) with comorbidity (HCC) 10/12/2023   Pulmonary nodule 08/21/2023   Hx of total hip arthroplasty, right 10/03/2021   De Quervain's tenosynovitis, right 09/06/2020   B12 deficiency 07/13/2020   Family history of colon cancer 12/01/2017   Benign essential hypertension 03/01/2016   Herpes zoster without complication 03/01/2016   BP (high blood pressure) 09/05/2015   Osteoporosis, post-menopausal 09/05/2015   Postsurgical menopause 09/05/2015   Class 1 obesity 09/05/2015   GERD (gastroesophageal reflux disease) 09/05/2015   Endometriosis 09/05/2015   Status post total hysterectomy and bilateral salpingo-oophorectomy 09/05/2015   Primary osteoarthritis of both knees 09/28/2014    PCP: Oneil Pinal MD    REFERRING PROVIDER: Fonda Koyanagi  PA-C  Hooten, Lynwood Idalia Raddle., MD (surgeon)   REFERRING DIAG: Left TKA    THERAPY DIAG:  No diagnosis found.   Rationale for Evaluation and Treatment: Rehabilitation   ONSET DATE: DOS  03/01/2024   SUBJECTIVE:    SUBJECTIVE STATEMENT:  Patient continues to report pain in her sciatic area. It was  worse yesterday.  Eval: Pt received PT from 04-02/2024 for bilat knee OA.  She received aquatic therapy  Pt underwent L TKA on 03/01/2024.  At this plan her pain is well-controlled.  She has been at home health and done well.  She did not use the cane some at home.  She practiced using the cane at home with home health.  She is using a walker outside the house.  She has an exercise program that she has been working.  Will review the exercise program today.  Her chief complaint at this time is difficulty sleeping.  She was advised that this is normal at this point.      PERTINENT HISTORY: L TKA on 6/302025 OA R knee and L shoulder. Bilat shoulder pain R THA in 2023 HTN Osteoporosis Anxiety Anemia Chronic fatigue PAIN:  Are you having pain? Yes: NPRS scale: 4/10 Pain location: L knee Pain description:  Aggravating factors:  Relieving factors:     PRECAUTIONS: None   RED FLAGS: None           WEIGHT BEARING RESTRICTIONS: No   FALLS:  Has patient fallen in last 6 months? No   LIVING ENVIRONMENT: 5 steps into the house   OCCUPATION:  Retired    PLOF: Independent   PATIENT GOALS:  To be able to her mother son dance by October  at her sons wedding/ to be able to walk      OBJECTIVE:  Note: Objective measures were completed at Evaluation unless otherwise noted.   DIAGNOSTIC FINDINGS:  Nothing post op    PATIENT SURVEYS:  LEFS  Extreme difficulty/unable (0), Quite a bit of difficulty (1), Moderate difficulty (2), Little difficulty (3), No difficulty (4) Survey date:  eval 8/28 10/31  Any of your usual work, housework or school activities  4   2. Usual hobbies, recreational or sporting activities  3   3. Getting into/out of the bath  3   4. Walking between rooms  3   5. Putting on socks/shoes  3   6. Squatting   3   7. Lifting an object, like a bag of groceries from the floor  2   8. Performing light activities around your home  1   9. Performing heavy activities  around your home  2   10. Getting into/out of a car  3   11. Walking 2 blocks  1   12. Walking 1 mile  1   13. Going up/down 10 stairs (1 flight)  1   14. Standing for 1 hour  1   15.  sitting for 1 hour  1   16. Running on even ground  1   17. Running on uneven ground  1   18. Making sharp turns while running fast  1   19. Hopping   1   20. Rolling over in bed  2   Score total:  23/80 38/80 47/80       COGNITION: Overall cognitive status: Within functional limits for tasks assessed                               SENSATION:   EDEMA:  Circumferential: 44.5 R  48.7 L    MUSCLE LENGTH:   POSTURE: rounded shoulders and forward head   PALPATION: No unexpected TTP    LOWER EXTREMITY ROM:   Passive ROM Right eval Left eval left left Left  8/11  L  8/18 Left  10/31  Hip flexion           Hip extension           Hip abduction           Hip adduction           Hip internal rotation           Hip external rotation           Knee flexion   96  100 103 118  123 125  Knee extension    -3 -3      Ankle dorsiflexion           Ankle plantarflexion           Ankle inversion           Ankle eversion            (Blank rows = not tested)   LOWER EXTREMITY MMT:   MMT (lb) Right 8/21 Left 8/21 Right  Left  Left  Right  10/31 Left 10/31  Hip flexion     4 4+ 4+ 27.0 27.5  Hip extension           Hip abduction     4 4+ 4+ 32.9 33.9  Hip adduction           Hip internal  rotation           Hip external rotation           Knee flexion  35.3 38.8        Knee extension (age norm 60lb)   46.0 46.1 46.0 ( from previous visit)  46.1 ( from previous visit)   32.7 47.1  Ankle dorsiflexion           Ankle plantarflexion           Ankle inversion           Ankle eversion            (Blank rows = not tested)       GAIT: Decreased left hip flexion Forward trunk flexion  Decreased left single leg                                                                                                                                     TREATMENT DATE:   [facility] Adult [facility]  Treatment:    12/26  Manual:  Manual: Trigger point release to QL Trigger point release to gluteal  Trigger point release to lower lumbar spine  Neuro-re-ed: Supine march 3x10  Supine hip abduction 3x10 red  Supine ball squeeze 3x10  LAQ with right 3x10  All with cuing for posture and core breathing.   12/16 Seated HSS with stool- 30sec x3ea Standing calf stretch 20sec x3ea Nu step L5 x16min (LE's only) Standing HSC 2x10 ea Standing hip abd/ext 3x10ea bil Standing hip hikes x10ea ITB stretch at wall 2x20sec Step ups 2x10 bottom step L only LTR 5 x10 ea   12/4 Manual: Edema massage to right knee  Trigger point release to IT band   SAQ 3x12 3lbs Quad set 3x12 bilateral  Ball stretch for lower back 10 x 5 sec hold  Lateral stretch 5x each side 5 sec hold  Step up:  6 inch 2x10  Lateral step  up 2x10   12/2 Manual: Edema massage to right knee  Trigger point release to IT band     SAQ 3x12 3lbs  LAQ 3x12 2lbs  Bilateral hip abduction 3x10 Green     11/29  There-ex:   SAQ 3x12 3lbs  LAQ 3x12 2lbs  Bilateral hip abduction 3x10 red  Standing weight shift x10 but caused pain on the right side   LTR 2x10    There-act:  Step up 4 inch 3x10 L only  Side step 3x10 4 inch  10/31 Manual: Trigger point release to QL Trigger point release to gluteal  Trigger point release to lower lumbar spine  There-ex:   SAQ 3x12 3lbs  LAQ 3x12 2lbs  Bilateral hip abduction 3x10 red  PROM into flexion   Reviewed strength and goals.    10/24 Manual: Scar tissue release to scar Trigger point release to lateral knee PROM into extension with distraction of the knee  Trigger point release to QL Trigger point release to gluteal  Trigger point release to lower lumbar spine  There-ex:  Quad set 3x15  SAQ 3x12 3lbs  LAQ 3x12  Bilateral hip abduction 3x10 red    Date:  06/23/24 Pt seen for aquatic therapy today.  Treatment took place in water 3.5-4.75 ft in depth at the [facility] pool. Temp of water was 91.  Pt entered/exited the pool via stairs independently with bil rail.  - unsupported walking forward/ backward, multiple laps - unsupported side stepping -> with arm add/abdct with rainbow hand floats-> lunge -  forward walking kick ue support RBHB -knee flex/ext CKC straddled on hollow noodle-> walking across 3.6 ft in same position forward and back - STS from 3rd step unsupported x 10. Good execution.->from 4th from bottom step 2 x 5 (completed well, good challenge) -hamstring and gastroc stretching -Sitting balance on yellow noodle in 3.6 ft (good challenge) lifting alternating ue.   10/17 Manual: Scar tissue release to scar Trigger pint release to lateral knee PROM into extension with distraction   There-ex:  PROM into flexion  Quad set 3x12  SAQ 1.5 lbs 3x12  LAQ 3x12 1.5 lbs   Strength and ROM measurements and review of goals   Date: 06/15/24 Pt seen for aquatic therapy today.  Treatment took place in water 3.5-4.75 ft in depth at the [facility] pool. Temp of water was 91.  Pt entered/exited the pool via stairs independently with bil rail.  - unsupported walking forward/ backward, multiple laps - unsupported side stepping -> with arm add/abdct with rainbow hand floats-> lunge -  forward walking kick ue support RBHB - STS at bench in water, with forward RUE reach and slow controlled descent, x 12(easy, no LOB) -SL STS from bench onto floor x 10 (no LOB) -hamstring and gastroc stretching - SLS right 2 x 20s hold->dynamic-ue add/abd x 5 (2 LOB) -Sitting balance on yellow noodle in 3.6 ft (good challenge) -STS from 3rd step x 5-> x 5 with add set using BB   06/11/24  Bike L4x6 minutes for w/u   Kallie- attempted limited by HS cramping  HS isometric digs x10 B Figure 4 stretch 2x30 seconds B (modified on R)   LAQs 3# 2x10 L  Seated TA set + alternating march x20 Seated TA set + clamshell green TB x12 STS green TB around knees x10 from mat table        Treatment:                                             Date: 06/08/24 Pt seen for aquatic therapy today.  Treatment took place in water 3.5-4.75 ft in depth at the [facility] pool. Temp of water was 91.  Pt entered/exited the pool via stairs independently with bil rail.  - unsupported walking forward/ backward, multiple laps - unsupported side stepping -> with arm add/abdct with rainbow hand floats-> lunge -  forward walking kick ue support RBHB - SLS right 2 x 20s hold->dynamic-ue add/abd x 5 (2 LOB) - STS at bench in water, with forward RUE reach and slow controlled descent, x 12 -SL STS from bench onto floor x 5->onto water step x5 (good challenge -Sitting balance on yellow noodle in 3.6 ft (good challenge)     PATIENT EDUCATION:  Education details:   Person educated: Patient  Education method: Explanation, Demonstration, Tactile cues, Verbal cues Education comprehension: verbalized understanding, returned demonstration, verbal cues required, tactile cues required, and needs further education   HOME EXERCISE PROGRAM: Access Code: [email or web address] URL: [email or web address] Date: 03/22/2024 Prepared by: Harlene Cordon  Exercises - Seated Hamstring Stretch  - 2-3 x daily - 7 x weekly - 2 sets - 5 breaths hold - Seated Heel Slide  - 5 x daily - 7 x weekly - 1 sets - 10 reps - Standing Gluteal Sets  - 5 x daily - 7 x weekly - 1 sets - 10 reps - Standing Weight Shift Side to Side Eyes Closed  - 5 x daily - 7 x weekly - 1 sets - 10 reps - Mini Squat  - 5 x daily - 7 x weekly - 1 sets - 10 reps - Sidelying Hip Abduction  - 3 x daily - 7 x weekly - 3 sets - 10 reps   ASSESSMENT:   CLINICAL IMPRESSION: The Patient has significant spasming in her lower lumbar spine and gluteals. Her spasming in the lower lumbar  spine released well with manual therapy. Her gluteal remained spasmed. We worked on core stability exercises.Overall she tolerated well. She was advised to continue with her exercises.    OBJECTIVE IMPAIRMENTS: Abnormal gait, decreased activity tolerance, decreased mobility, difficulty walking, decreased ROM, decreased strength, increased edema, increased muscle spasms, and pain.    ACTIVITY LIMITATIONS: carrying, lifting, bending, standing, squatting, stairs, transfers, bed mobility, bathing, and locomotion level   PARTICIPATION LIMITATIONS: meal prep, cleaning, laundry, driving, shopping, community activity, occupation, and yard work   PERSONAL FACTORS: 1-2 comorbidities: hip OA  are also affecting patient's functional outcome.    REHAB POTENTIAL: Good   CLINICAL DECISION MAKING: Stable/uncomplicated   EVALUATION COMPLEXITY: Low     GOALS: Goals reviewed with patient? Yes  04/13/2024     SHORT TERM GOALS: Target date:  Patient will increase passive knee flexion to 128 degrees on the left Baseline: Goal status: achived 10/31  2.  Patient will increase passive left knee extension to full Baseline:  Goal status: Achieved 9/11 3.  Patient will progress off assistive device and ambulate greater than 500 feet without significant antalgic gait Baseline:  Goal status: Still using cane.  Patient reports she does not feel safe off the cane yet 10/31   4.  Patient will be independent with HEP Baseline:  Goal status: Has initial HEP achieved 10/31     LONG TERM GOALS: Target date: POC     Patient will go up and down 12 steps with reciprocal gait pattern without increased pain Baseline:  Goal status: stairs have been limited by her back     2.  Patient will squat pick items up off the ground without increased pain Baseline:  Goal status: Working on squats but not achieved 10/31 2nd to sciatica    3.  Patient will ambulate community distances without pain Baseline:  Goal  status: Still using cane somewhat 10/31    4.  Patient will have complete HEP to promote strengthening Baseline:  Goal status: Continue to progress towards end of HEP  for knee 10/31      PLAN:   PT FREQUENCY: 2x/week   PT DURATION: 8 weeks   PLANNED INTERVENTIONS: 97110-Therapeutic exercises, 97530- Therapeutic activity, 97112- Neuromuscular re-education, 97535- Self Care, [id number]- Manual therapy, [id number]- Gait training, [phone]- Aquatic Therapy, [id number]- , [id number]- Ultrasound, Patient/Family education, Stair training, Dry Needling, DME instructions, Cryotherapy, and Moist heat  PLAN FOR NEXT SESSION:  Progressed to functional strengthening such as squats and steps as tolerated. Re-assess back if prescribed    Alm JINNY Don, PT 08/27/2024, 10:50 AM           Referring diagnosis? Left TKA  Treatment diagnosis? (if different than referring diagnosis) LEft knee stiffness  Left knee pain  What was this (referring dx) caused by? [x]  Surgery []  Fall []  Ongoing issue []  Arthritis []  Other: ____________  Laterality: []  Rt [x]  Lt []  Both  Check all possible CPT codes:  *CHOOSE 10 OR LESS*    See Planned Interventions listed in the Plan section of the Evaluation.

## 2024-09-01 ENCOUNTER — Encounter (HOSPITAL_BASED_OUTPATIENT_CLINIC_OR_DEPARTMENT_OTHER): Payer: Self-pay | Admitting: Physical Therapy

## 2024-09-01 ENCOUNTER — Ambulatory Visit (HOSPITAL_BASED_OUTPATIENT_CLINIC_OR_DEPARTMENT_OTHER): Admitting: Physical Therapy

## 2024-09-01 DIAGNOSIS — M25562 Pain in left knee: Secondary | ICD-10-CM

## 2024-09-01 DIAGNOSIS — R2689 Other abnormalities of gait and mobility: Secondary | ICD-10-CM

## 2024-09-01 DIAGNOSIS — M6281 Muscle weakness (generalized): Secondary | ICD-10-CM

## 2024-09-01 NOTE — Therapy (Signed)
 [facility]    OUTPATIENT PHYSICAL THERAPY LOWER EXTREMITY TREATMENT     Patient Name: Latasha [name] MRN: [id number] DOB:11-Nov-1960, 63 y.o., female Today's Date: 03/14/2024   END OF SESSION:    PT End of Session - 09/01/24 1107     Visit Number 35    Number of Visits --    Date for Recertification  10/29/24    Authorization Type [employer] m[employer] Progress note to be done at 45    Authorization Time Period 14 visits 11/29  to 2/27    Authorization - Visit Number 6    Authorization - Number of Visits 14    PT Start Time 1103    PT Stop Time 1145    PT Time Calculation (min) 42 min    Activity Tolerance Patient tolerated treatment well    Behavior During Therapy [facility] for tasks assessed/performed                       Patient Active Problem List    Diagnosis Date Noted   History of total knee arthroplasty, left 03/01/2024   Tubular adenoma 02/25/2024   Severe obesity (BMI 35.0-39.9) with comorbidity (HCC) 10/12/2023   Pulmonary nodule 08/21/2023   Hx of total hip arthroplasty, right 10/03/2021   De Quervain's tenosynovitis, right 09/06/2020   B12 deficiency 07/13/2020   Family history of colon cancer 12/01/2017   Benign essential hypertension 03/01/2016   Herpes zoster without complication 03/01/2016   BP (high blood pressure) 09/05/2015   Osteoporosis, post-menopausal 09/05/2015   Postsurgical menopause 09/05/2015   Class 1 obesity 09/05/2015   GERD (gastroesophageal reflux disease) 09/05/2015   Endometriosis 09/05/2015   Status post total hysterectomy and bilateral salpingo-oophorectomy 09/05/2015   Primary osteoarthritis of both knees 09/28/2014    PCP: Oneil Pinal MD    REFERRING PROVIDER: Fonda Koyanagi  PA-C  Hooten, Lynwood Idalia Raddle., MD (surgeon)   REFERRING DIAG: Left TKA    THERAPY DIAG:  No diagnosis found.   Rationale for Evaluation and Treatment: Rehabilitation   ONSET DATE: DOS  03/01/2024   SUBJECTIVE:   Progress  Note Reporting Period 03/15/24 to 09/01/2024   See note below for Objective Data and Assessment of Progress/Goals.       SUBJECTIVE STATEMENT:  Getting sciatic pain down my left leg and cramping in lateral and medial thigh and top of foot. Rt TKA scheduled 11/15/24  Eval: Pt received PT from 04-02/2024 for bilat knee OA.  She received aquatic therapy  Pt underwent L TKA on 03/01/2024.  At this plan her pain is well-controlled.  She has been at home health and done well.  She did not use the cane some at home.  She practiced using the cane at home with home health.  She is using a walker outside the house.  She has an exercise program that she has been working.  Will review the exercise program today.  Her chief complaint at this time is difficulty sleeping.  She was advised that this is normal at this point.      PERTINENT HISTORY: L TKA on 6/302025 OA R knee and L shoulder. Bilat shoulder pain R THA in 2023 HTN Osteoporosis Anxiety Anemia Chronic fatigue PAIN:  Are you having pain? Yes: NPRS scale: 4/10 Pain location: L knee Pain description:  Aggravating factors:  Relieving factors:     PRECAUTIONS: None   RED FLAGS: None  WEIGHT BEARING RESTRICTIONS: No   FALLS:  Has patient fallen in last 6 months? No   LIVING ENVIRONMENT: 5 steps into the house   OCCUPATION:  Retired    PLOF: Independent   PATIENT GOALS:  To be able to her mother son dance by October at her sons wedding/ to be able to walk      OBJECTIVE:  Note: Objective measures were completed at Evaluation unless otherwise noted.   DIAGNOSTIC FINDINGS:  Nothing post op    PATIENT SURVEYS:  LEFS  Extreme difficulty/unable (0), Quite a bit of difficulty (1), Moderate difficulty (2), Little difficulty (3), No difficulty (4) Survey date:  eval 8/28 10/31 12/31- Rt knee 12/31- Lt knee  Any of your usual work, housework or school activities  4  0 3  2. Usual hobbies, recreational or sporting  activities  3  0 3  3. Getting into/out of the bath  3  1 3   4. Walking between rooms  3  1 3   5. Putting on socks/shoes  3  1 2   6. Squatting   3  1 2   7. Lifting an object, like a bag of groceries from the floor  2  1 2   8. Performing light activities around your home  1  1 2   9. Performing heavy activities around your home  2  0 2  10. Getting into/out of a car  3  2 3   11. Walking 2 blocks  1  1 2   12. Walking 1 mile  1  1 2   13. Going up/down 10 stairs (1 flight)  1  1 2   14. Standing for 1 hour  1  0 1  15.  sitting for 1 hour  1  1 1   16. Running on even ground  1  0 0  17. Running on uneven ground  1  0 0  18. Making sharp turns while running fast  1  0 0  19. Hopping   1  0 0  20. Rolling over in bed  2  1 3   Score total:  23/80 38/80 47/80  13/80 34/80      COGNITION: Overall cognitive status: Within functional limits for tasks assessed                               SENSATION:   EDEMA:  Circumferential: 44.5 R  48.7 L    MUSCLE LENGTH:   POSTURE: rounded shoulders and forward head   PALPATION: No unexpected TTP    LOWER EXTREMITY ROM:   Passive ROM Right eval Left eval left left Left  8/11  L  8/18 Left  10/31  Hip flexion           Hip extension           Hip abduction           Hip adduction           Hip internal rotation           Hip external rotation           Knee flexion   96  100 103 118  123 125  Knee extension    -3 -3      Ankle dorsiflexion           Ankle plantarflexion           Ankle inversion  Ankle eversion            (Blank rows = not tested)   LOWER EXTREMITY MMT:   MMT (lb) Right 8/21 Left 8/21 Right  Left  Left  Right  10/31 Left 10/31 Rt/Lt 12/31  Hip flexion     4 4+ 4+ 27.0 27.5   Hip extension            Hip abduction     4 4+ 4+ 32.9 33.9 26.3/18.1  Hip adduction            Hip internal rotation            Hip external rotation            Knee flexion  35.3 38.8       31.0/34.5  Knee extension  (age norm 60lb)   46.0 46.1 46.0 ( from previous visit)  46.1 ( from previous visit)   32.7 47.1 46.2/57.4  Ankle dorsiflexion            Ankle plantarflexion            Ankle inversion            Ankle eversion             (Blank rows = not tested)       GAIT: Decreased left hip flexion Forward trunk flexion  Decreased left single leg                                                                                                                                    TREATMENT DATE:   [facility]  Treatment:    12/26  Manual:  Manual: Trigger point release to QL Trigger point release to gluteal  Trigger point release to lower lumbar spine  Neuro-re-ed: Supine march 3x10  Supine hip abduction 3x10 red  Supine ball squeeze 3x10  LAQ with right 3x10  All with cuing for posture and core breathing.   12/16 Seated HSS with stool- 30sec x3ea Standing calf stretch 20sec x3ea Nu step L5 x29min (LE's only) Standing HSC 2x10 ea Standing hip abd/ext 3x10ea bil Standing hip hikes x10ea ITB stretch at wall 2x20sec Step ups 2x10 bottom step L only LTR 5 x10 ea   12/4 Manual: Edema massage to right knee  Trigger point release to IT band   SAQ 3x12 3lbs Quad set 3x12 bilateral  Ball stretch for lower back 10 x 5 sec hold  Lateral stretch 5x each side 5 sec hold  Step up:  6 inch 2x10  Lateral step  up 2x10   12/2 Manual: Edema massage to right knee  Trigger point release to IT band     SAQ 3x12 3lbs  LAQ 3x12 2lbs  Bilateral hip abduction 3x10 Green     11/29  There-ex:   SAQ 3x12 3lbs  LAQ 3x12 2lbs  Bilateral hip abduction 3x10 red  Standing weight shift x10 but caused pain on the right side   LTR 2x10    There-act:  Step up 4 inch 3x10 L only  Side step 3x10 4 inch  10/31 Manual: Trigger point release to QL Trigger point release to gluteal  Trigger point release to lower lumbar spine  There-ex:   SAQ 3x12 3lbs  LAQ 3x12 2lbs   Bilateral hip abduction 3x10 red  PROM into flexion   Reviewed strength and goals.    10/24 Manual: Scar tissue release to scar Trigger point release to lateral knee PROM into extension with distraction of the knee   Trigger point release to QL Trigger point release to gluteal  Trigger point release to lower lumbar spine  There-ex:  Quad set 3x15  SAQ 3x12 3lbs  LAQ 3x12  Bilateral hip abduction 3x10 red    Date: 06/23/24 Pt seen for aquatic therapy today.  Treatment took place in water 3.5-4.75 ft in depth at the [facility] pool. Temp of water was 91.  Pt entered/exited the pool via stairs independently with bil rail.  - unsupported walking forward/ backward, multiple laps - unsupported side stepping -> with arm add/abdct with rainbow hand floats-> lunge -  forward walking kick ue support RBHB -knee flex/ext CKC straddled on hollow noodle-> walking across 3.6 ft in same position forward and back - STS from 3rd step unsupported x 10. Good execution.->from 4th from bottom step 2 x 5 (completed well, good challenge) -hamstring and gastroc stretching -Sitting balance on yellow noodle in 3.6 ft (good challenge) lifting alternating ue.   10/17 Manual: Scar tissue release to scar Trigger pint release to lateral knee PROM into extension with distraction   There-ex:  PROM into flexion  Quad set 3x12  SAQ 1.5 lbs 3x12  LAQ 3x12 1.5 lbs   Strength and ROM measurements and review of goals   Date: 06/15/24 Pt seen for aquatic therapy today.  Treatment took place in water 3.5-4.75 ft in depth at the [facility] pool. Temp of water was 91.  Pt entered/exited the pool via stairs independently with bil rail.  - unsupported walking forward/ backward, multiple laps - unsupported side stepping -> with arm add/abdct with rainbow hand floats-> lunge -  forward walking kick ue support RBHB - STS at bench in water, with forward RUE reach and slow controlled  descent, x 12(easy, no LOB) -SL STS from bench onto floor x 10 (no LOB) -hamstring and gastroc stretching - SLS right 2 x 20s hold->dynamic-ue add/abd x 5 (2 LOB) -Sitting balance on yellow noodle in 3.6 ft (good challenge) -STS from 3rd step x 5-> x 5 with add set using BB   06/11/24  Bike L4x6 minutes for w/u   Kallie- attempted limited by HS cramping  HS isometric digs x10 B Figure 4 stretch 2x30 seconds B (modified on R)  LAQs 3# 2x10 L  Seated TA set + alternating march x20 Seated TA set + clamshell green TB x12 STS green TB around knees x10 from mat table        Treatment:                                             Date: 06/08/24 Pt seen for aquatic therapy today.  Treatment took place in water 3.5-4.75 ft in depth at the [facility] pool. Temp of water  was 91.  Pt entered/exited the pool via stairs independently with bil rail.  - unsupported walking forward/ backward, multiple laps - unsupported side stepping -> with arm add/abdct with rainbow hand floats-> lunge -  forward walking kick ue support RBHB - SLS right 2 x 20s hold->dynamic-ue add/abd x 5 (2 LOB) - STS at bench in water, with forward RUE reach and slow controlled descent, x 12 -SL STS from bench onto floor x 5->onto water step x5 (good challenge -Sitting balance on yellow noodle in 3.6 ft (good challenge)     PATIENT EDUCATION:  Education details:   Person educated: Patient Education method: Programmer, Multimedia, Demonstration, Actor cues, Verbal cues Education comprehension: verbalized understanding, returned demonstration, verbal cues required, tactile cues required, and needs further education   HOME EXERCISE PROGRAM: Access Code: [email or web address] URL: [email or web address] Date: 03/22/2024 Prepared by: Harlene Cordon  Exercises - Seated Hamstring Stretch  - 2-3 x daily - 7 x weekly - 2 sets - 5 breaths hold - Seated Heel Slide  - 5 x daily - 7 x weekly - 1 sets - 10 reps -  Standing Gluteal Sets  - 5 x daily - 7 x weekly - 1 sets - 10 reps - Standing Weight Shift Side to Side Eyes Closed  - 5 x daily - 7 x weekly - 1 sets - 10 reps - Mini Squat  - 5 x daily - 7 x weekly - 1 sets - 10 reps - Sidelying Hip Abduction  - 3 x daily - 7 x weekly - 3 sets - 10 reps   ASSESSMENT:   CLINICAL IMPRESSION: Time taken today to discuss prehab, POC and progress note measurements. Discussed the importance of strength and mobility going in to surgery. Notable lack of strength in Lt hip that is contributing to LE pain but also contribution from antalgic gait from pain in Rt knee. Extending POC to match insurance auth in order to utilize pool to continue with strength challenges with upright posture to decrease poor movements as result of Rt knee pain.    OBJECTIVE IMPAIRMENTS: Abnormal gait, decreased activity tolerance, decreased mobility, difficulty walking, decreased ROM, decreased strength, increased edema, increased muscle spasms, and pain.    ACTIVITY LIMITATIONS: carrying, lifting, bending, standing, squatting, stairs, transfers, bed mobility, bathing, and locomotion level   PARTICIPATION LIMITATIONS: meal prep, cleaning, laundry, driving, shopping, community activity, occupation, and yard work   PERSONAL FACTORS: 1-2 comorbidities: hip OA  are also affecting patient's functional outcome.    REHAB POTENTIAL: Good   CLINICAL DECISION MAKING: Stable/uncomplicated   EVALUATION COMPLEXITY: Low     GOALS: Goals reviewed with patient? Yes  04/13/2024     SHORT TERM GOALS: Target date:  Patient will increase passive knee flexion to 128 degrees on the left Baseline: Goal status: achived 10/31  2.  Patient will increase passive left knee extension to full Baseline:  Goal status: Achieved 9/11 3.  Patient will progress off assistive device and ambulate greater than 500 feet without significant antalgic gait Baseline:  Goal status: Still using cane.  Patient reports  she does not feel safe off the cane yet 10/31   4.  Patient will be independent with HEP Baseline:  Goal status: Has initial HEP achieved 10/31     LONG TERM GOALS: Target date: POC     Patient will go up and down 12 steps with reciprocal gait pattern without increased pain Baseline:  Goal status: ongoing- does ok but is uncomfortable    2.  Patient will squat pick items up off the ground without increased pain Baseline:  Goal status: improving, significant limitation by Rt knee, lacking necessary strength to fully do the motion   3.  Patient will ambulate community distances without pain Baseline:  Goal status: Lt knee is great, everything else makes it difficult    4.  Patient will have complete HEP to promote strengthening Baseline:  Goal status: requires further progression and management of Rt knee and left hip.       PLAN:   PT FREQUENCY: 1-22x/week   PT DURATION: POC date   PLANNED INTERVENTIONS: 97110-Therapeutic exercises, 97530- Therapeutic activity, [id number]- Neuromuscular re-education, 97535- Self Care, [id number]- Manual therapy, [phone]- Gait training, [phone]- Aquatic Therapy, [id number]- , [phone]- Ultrasound, Patient/Family education, Stair training, Dry Needling, DME instructions, Cryotherapy, and Moist heat    PLAN FOR NEXT SESSION:  Prehab for Rt knee surgery 11/15/24, plan to d/c at 2/27 end date and utilize 2 week [facility] Trial.    Harlene Cordon, PT, DPT 09/01/2024, 1:07 PM           Referring diagnosis? Left TKA  Treatment diagnosis? (if different than referring diagnosis) LEft knee stiffness  Left knee pain  What was this (referring dx) caused by? [x]  Surgery []  Fall []  Ongoing issue []  Arthritis []  Other: ____________  Laterality: []  Rt [x]  Lt []  Both  Check all possible CPT codes:  *CHOOSE 10 OR LESS*    See Planned Interventions listed in the Plan section of the Evaluation.

## 2024-09-03 ENCOUNTER — Encounter (HOSPITAL_BASED_OUTPATIENT_CLINIC_OR_DEPARTMENT_OTHER): Admitting: Physical Therapy

## 2024-09-06 ENCOUNTER — Other Ambulatory Visit: Payer: Self-pay | Admitting: Gastroenterology

## 2024-09-06 DIAGNOSIS — R748 Abnormal levels of other serum enzymes: Secondary | ICD-10-CM

## 2024-09-10 ENCOUNTER — Ambulatory Visit (HOSPITAL_BASED_OUTPATIENT_CLINIC_OR_DEPARTMENT_OTHER): Payer: Self-pay | Attending: Student | Admitting: Physical Therapy

## 2024-09-10 DIAGNOSIS — M25561 Pain in right knee: Secondary | ICD-10-CM | POA: Insufficient documentation

## 2024-09-10 DIAGNOSIS — M25562 Pain in left knee: Secondary | ICD-10-CM | POA: Insufficient documentation

## 2024-09-10 DIAGNOSIS — R2689 Other abnormalities of gait and mobility: Secondary | ICD-10-CM | POA: Diagnosis present

## 2024-09-10 DIAGNOSIS — G8929 Other chronic pain: Secondary | ICD-10-CM | POA: Insufficient documentation

## 2024-09-10 DIAGNOSIS — M6281 Muscle weakness (generalized): Secondary | ICD-10-CM | POA: Insufficient documentation

## 2024-09-10 DIAGNOSIS — R269 Unspecified abnormalities of gait and mobility: Secondary | ICD-10-CM | POA: Insufficient documentation

## 2024-09-10 DIAGNOSIS — M25662 Stiffness of left knee, not elsewhere classified: Secondary | ICD-10-CM | POA: Diagnosis present

## 2024-09-10 NOTE — Therapy (Unsigned)
 [facility]    OUTPATIENT PHYSICAL THERAPY LOWER EXTREMITY TREATMENT     Patient Name: Latasha [name] MRN: [id number] DOB:[birth year], 64 y.o., female Today's Date: 03/14/2024   END OF SESSION:    PT End of Session - 09/01/24 1107     Visit Number 35    Number of Visits --    Date for Recertification  10/29/24    Authorization Type [employer] mcr Progress note to be done at 45    Authorization Time Period 14 visits 11/29  to 2/27    Authorization - Visit Number 6    Authorization - Number of Visits 14    PT Start Time 1103    PT Stop Time 1145    PT Time Calculation (min) 42 min    Activity Tolerance Patient tolerated treatment well    Behavior During Therapy [facility] for tasks assessed/performed                       Patient Active Problem List    Diagnosis Date Noted   History of total knee arthroplasty, left 03/01/2024   Tubular adenoma 02/25/2024   Severe obesity (BMI 35.0-39.9) with comorbidity (HCC) 10/12/2023   Pulmonary nodule 08/21/2023   Hx of total hip arthroplasty, right 10/03/2021   De Quervain's tenosynovitis, right 09/06/2020   B12 deficiency 07/13/2020   Family history of colon cancer 12/01/2017   Benign essential hypertension 03/01/2016   Herpes zoster without complication 03/01/2016   BP (high blood pressure) 09/05/2015   Osteoporosis, post-menopausal 09/05/2015   Postsurgical menopause 09/05/2015   Class 1 obesity 09/05/2015   GERD (gastroesophageal reflux disease) 09/05/2015   Endometriosis 09/05/2015   Status post total hysterectomy and bilateral salpingo-oophorectomy 09/05/2015   Primary osteoarthritis of both knees 09/28/2014    PCP: Oneil Pinal MD    REFERRING PROVIDER: Fonda Koyanagi  PA-C  Hooten, Lynwood Idalia Raddle., MD (surgeon)   REFERRING DIAG: Left TKA    THERAPY DIAG:  No diagnosis found.   Rationale for Evaluation and Treatment: Rehabilitation   ONSET DATE: DOS  03/01/2024   SUBJECTIVE:  Progress  Note Reporting Period 03/15/24 to 09/01/2024   See note below for Objective Data and Assessment of Progress/Goals.       SUBJECTIVE STATEMENT:  Back is feeling terrible however knee was feeling pretty good.Her husband had surgery and she is taking care of him.   Eval: Pt received PT from 04-02/2024 for bilat knee OA.  She received aquatic therapy  Pt underwent L TKA on 03/01/2024.  At this plan her pain is well-controlled.  She has been at home health and done well.  She did not use the cane some at home.  She practiced using the cane at home with home health.  She is using a walker outside the house.  She has an exercise program that she has been working.  Will review the exercise program today.  Her chief complaint at this time is difficulty sleeping.  She was advised that this is normal at this point.      PERTINENT HISTORY: L TKA on 6/302025 OA R knee and L shoulder. Bilat shoulder pain R THA in 2023 HTN Osteoporosis Anxiety Anemia Chronic fatigue PAIN:  Are you having pain? Yes: NPRS scale: 4/10 Pain location: L knee Pain description:  Aggravating factors:  Relieving factors:     PRECAUTIONS: None   RED FLAGS: None           WEIGHT BEARING  RESTRICTIONS: No   FALLS:  Has patient fallen in last 6 months? No   LIVING ENVIRONMENT: 5 steps into the house   OCCUPATION:  Retired    PLOF: Independent   PATIENT GOALS:  To be able to her mother son dance by October at her sons wedding/ to be able to walk      OBJECTIVE:  Note: Objective measures were completed at Evaluation unless otherwise noted.   DIAGNOSTIC FINDINGS:  Nothing post op    PATIENT SURVEYS:  LEFS  Extreme difficulty/unable (0), Quite a bit of difficulty (1), Moderate difficulty (2), Little difficulty (3), No difficulty (4) Survey date:  eval 8/28 10/31 12/31- Rt knee 12/31- Lt knee  Any of your usual work, housework or school activities  4  0 3  2. Usual hobbies, recreational or sporting  activities  3  0 3  3. Getting into/out of the bath  3  1 3   4. Walking between rooms  3  1 3   5. Putting on socks/shoes  3  1 2   6. Squatting   3  1 2   7. Lifting an object, like a bag of groceries from the floor  2  1 2   8. Performing light activities around your home  1  1 2   9. Performing heavy activities around your home  2  0 2  10. Getting into/out of a car  3  2 3   11. Walking 2 blocks  1  1 2   12. Walking 1 mile  1  1 2   13. Going up/down 10 stairs (1 flight)  1  1 2   14. Standing for 1 hour  1  0 1  15.  sitting for 1 hour  1  1 1   16. Running on even ground  1  0 0  17. Running on uneven ground  1  0 0  18. Making sharp turns while running fast  1  0 0  19. Hopping   1  0 0  20. Rolling over in bed  2  1 3   Score total:  23/80 38/80 47/80  13/80 34/80      COGNITION: Overall cognitive status: Within functional limits for tasks assessed                               SENSATION:   EDEMA:  Circumferential: 44.5 R  48.7 L    MUSCLE LENGTH:   POSTURE: rounded shoulders and forward head   PALPATION: No unexpected TTP    LOWER EXTREMITY ROM:   Passive ROM Right eval Left eval left left Left  8/11  L  8/18 Left  10/31  Hip flexion           Hip extension           Hip abduction           Hip adduction           Hip internal rotation           Hip external rotation           Knee flexion   96  100 103 118  123 125  Knee extension    -3 -3      Ankle dorsiflexion           Ankle plantarflexion           Ankle inversion  Ankle eversion            (Blank rows = not tested)   LOWER EXTREMITY MMT:   MMT (lb) Right 8/21 Left 8/21 Right  Left  Left  Right  10/31 Left 10/31 Rt/Lt 12/31  Hip flexion     4 4+ 4+ 27.0 27.5   Hip extension            Hip abduction     4 4+ 4+ 32.9 33.9 26.3/18.1  Hip adduction            Hip internal rotation            Hip external rotation            Knee flexion  35.3 38.8       31.0/34.5  Knee extension  (age norm 60lb)   46.0 46.1 46.0 ( from previous visit)  46.1 ( from previous visit)   32.7 47.1 46.2/57.4  Ankle dorsiflexion            Ankle plantarflexion            Ankle inversion            Ankle eversion             (Blank rows = not tested)       GAIT: Decreased left hip flexion Forward trunk flexion  Decreased left single leg                                                                                                                                    TREATMENT DATE:   [facility]  Treatment:    1/9  Manual:  Trigger point release to QL Trigger point release to gluteal  Trigger point release to lower lumbar spine  There-ex:  LTR 3x10  SAQ 3x10 2lbs LAQ 2lbs 3x10  Hip abduction 3x10   There-act Step up-hold left leg 3x12 4inch All with cuing for posture and core breathing.      PATIENT EDUCATION:  Education details:   Person educated: Patient Education method: Solicitor, Actor cues, Verbal cues Education comprehension: verbalized understanding, returned demonstration, verbal cues required, tactile cues required, and needs further education   HOME EXERCISE PROGRAM: Access Code: [email or web address] URL: [email or web address] Date: 03/22/2024 Prepared by: Harlene Cordon  Exercises - Seated Hamstring Stretch  - 2-3 x daily - 7 x weekly - 2 sets - 5 breaths hold - Seated Heel Slide  - 5 x daily - 7 x weekly - 1 sets - 10 reps - Standing Gluteal Sets  - 5 x daily - 7 x weekly - 1 sets - 10 reps - Standing Weight Shift Side to Side Eyes Closed  - 5 x daily - 7 x weekly - 1 sets - 10 reps - Mini Squat  - 5 x daily - 7 x weekly - 1 sets - 10 reps - Sidelying Hip  Abduction  - 3 x daily - 7 x weekly - 3 sets - 10 reps   ASSESSMENT:   CLINICAL IMPRESSION:  The patient continues to have significant spasming in her gluteal on the left side and sciatic pain down the leg. This seems to be tied to the pain in her right knee. We will  continue to strengthen until her surgery,    OBJECTIVE: Abnormal gait, decreased activity tolerance, decreased mobility, difficulty walking, decreased ROM, decreased strength, increased edema, increased muscle spasms, and pain.    ACTIVITY LIMITATIONS: carrying, lifting, bending, standing, squatting, stairs, transfers, bed mobility, bathing, and locomotion level   PARTICIPATION LIMITATIONS: meal prep, cleaning, laundry, driving, shopping, community activity, occupation, and yard work   PERSONAL FACTORS: 1-2 comorbidities: hip OA  are also affecting patient's functional outcome.    REHAB POTENTIAL: Good   CLINICAL DECISION MAKING: Stable/uncomplicated   EVALUATION COMPLEXITY: Low     GOALS: Goals reviewed with patient? Yes  04/13/2024     SHORT TERM GOALS: Target date:  Patient will increase passive knee flexion to 128 degrees on the left Baseline: Goal status: achived 10/31  2.  Patient will increase passive left knee extension to full Baseline:  Goal status: Achieved 9/11 3.  Patient will progress off assistive device and ambulate greater than 500 feet without significant antalgic gait Baseline:  Goal status: Still using cane.  Patient reports she does not feel safe off the cane yet 10/31   4.  Patient will be independent with HEP Baseline:  Goal status: Has initial HEP achieved 10/31     LONG TERM GOALS: Target date: POC     Patient will go up and down 12 steps with reciprocal gait pattern without increased pain Baseline:  Goal status: ongoing- does ok but is uncomfortable    2.  Patient will squat pick items up off the ground without increased pain Baseline:  Goal status: improving, significant limitation by Rt knee, lacking necessary strength to fully do the motion   3.  Patient will ambulate community distances without pain Baseline:  Goal status: Lt knee is great, everything else makes it difficult    4.  Patient will have complete HEP to promote  strengthening Baseline:  Goal status: requires further progression and management of Rt knee and left hip.       PLAN:   PT FREQUENCY: 1-22x/week   PT DURATION: POC date   PLANNED INTERVENTIONS: 97110-Therapeutic exercises, 97530- Therapeutic activity, [id number]- Neuromuscular re-education, 97535- Self Care, [id number]- Manual therapy, [phone]- Gait training, [phone]- Aquatic Therapy, [id number]- , [phone]- Ultrasound, Patient/Family education, Stair training, Dry Needling, DME instructions, Cryotherapy, and Moist heat    PLAN FOR NEXT SESSION:  Prehab for Rt knee surgery 11/15/24, plan to d/c at 2/27 end date and utilize 2 week [facility] Trial.    Harlene Cordon, PT, DPT 09/01/2024, 1:07 PM           Referring diagnosis? Left TKA  Treatment diagnosis? (if different than referring diagnosis) LEft knee stiffness  Left knee pain  What was this (referring dx) caused by? [x]  Surgery []  Fall []  Ongoing issue []  Arthritis []  Other: ____________  Laterality: []  Rt [x]  Lt []  Both  Check all possible CPT codes:  *CHOOSE 10 OR LESS*    See Planned Interventions listed in the Plan section of the Evaluation.

## 2024-09-12 ENCOUNTER — Encounter (HOSPITAL_BASED_OUTPATIENT_CLINIC_OR_DEPARTMENT_OTHER): Payer: Self-pay | Admitting: Physical Therapy

## 2024-09-14 ENCOUNTER — Encounter (HOSPITAL_BASED_OUTPATIENT_CLINIC_OR_DEPARTMENT_OTHER): Payer: Self-pay

## 2024-09-14 ENCOUNTER — Ambulatory Visit (HOSPITAL_BASED_OUTPATIENT_CLINIC_OR_DEPARTMENT_OTHER)

## 2024-09-14 ENCOUNTER — Ambulatory Visit
Admission: RE | Admit: 2024-09-14 | Discharge: 2024-09-14 | Disposition: A | Discharge status: Home / Self Care | Source: Ambulatory Visit | Attending: Gastroenterology | Admitting: Gastroenterology

## 2024-09-14 DIAGNOSIS — R748 Abnormal levels of other serum enzymes: Secondary | ICD-10-CM | POA: Insufficient documentation

## 2024-09-14 DIAGNOSIS — R2689 Other abnormalities of gait and mobility: Secondary | ICD-10-CM | POA: Diagnosis not present

## 2024-09-14 DIAGNOSIS — M25562 Pain in left knee: Secondary | ICD-10-CM

## 2024-09-14 DIAGNOSIS — M6281 Muscle weakness (generalized): Secondary | ICD-10-CM

## 2024-09-14 NOTE — Therapy (Signed)
 [facility]    OUTPATIENT PHYSICAL THERAPY LOWER EXTREMITY TREATMENT     Patient Name: Latasha [name] MRN: [id number] DOB:1960/10/28, 64 y.o., female Today's Date: 03/14/2024   END OF SESSION:    PT End of Session - 09/01/24 1107     Visit Number 35    Number of Visits --    Date for Recertification  10/29/24    Authorization Type [employer] mcr Progress note to be done at 45    Authorization Time Period 14 visits 11/29  to 2/27    Authorization - Visit Number 6    Authorization - Number of Visits 14    PT Start Time 1103    PT Stop Time 1145    PT Time Calculation (min) 42 min    Activity Tolerance Patient tolerated treatment well    Behavior During Therapy [facility] for tasks assessed/performed                       Patient Active Problem List    Diagnosis Date Noted   History of total knee arthroplasty, left 03/01/2024   Tubular adenoma 02/25/2024   Severe obesity (BMI 35.0-39.9) with comorbidity (HCC) 10/12/2023   Pulmonary nodule 08/21/2023   Hx of total hip arthroplasty, right 10/03/2021   De Quervain's tenosynovitis, right 09/06/2020   B12 deficiency 07/13/2020   Family history of colon cancer 12/01/2017   Benign essential hypertension 03/01/2016   Herpes zoster without complication 03/01/2016   BP (high blood pressure) 09/05/2015   Osteoporosis, post-menopausal 09/05/2015   Postsurgical menopause 09/05/2015   Class 1 obesity 09/05/2015   GERD (gastroesophageal reflux disease) 09/05/2015   Endometriosis 09/05/2015   Status post total hysterectomy and bilateral salpingo-oophorectomy 09/05/2015   Primary osteoarthritis of both knees 09/28/2014    PCP: Oneil Pinal MD    REFERRING PROVIDER: Fonda Koyanagi  PA-C  Hooten, Lynwood Idalia Raddle., MD (surgeon)   REFERRING DIAG: Left TKA    THERAPY DIAG:  No diagnosis found.   Rationale for Evaluation and Treatment: Rehabilitation   ONSET DATE: DOS  03/01/2024   SUBJECTIVE:        SUBJECTIVE STATEMENT:  Pt has been having increased pain throughout body. Has been caring for husband who recently had hip surgery. Pt also attributes some pain to cold weather. Having TKA in March.  Eval: Pt received PT from 04-02/2024 for bilat knee OA.  She received aquatic therapy  Pt underwent L TKA on 03/01/2024.  At this plan her pain is well-controlled.  She has been at home health and done well.  She did not use the cane some at home.  She practiced using the cane at home with home health.  She is using a walker outside the house.  She has an exercise program that she has been working.  Will review the exercise program today.  Her chief complaint at this time is difficulty sleeping.  She was advised that this is normal at this point.      PERTINENT HISTORY: L TKA on 6/302025 OA R knee and L shoulder. Bilat shoulder pain R THA in 2023 HTN Osteoporosis Anxiety Anemia Chronic fatigue PAIN:  Are you having pain? Yes: NPRS scale: 4/10 Pain location: L knee Pain description:  Aggravating factors:  Relieving factors:     PRECAUTIONS: None   RED FLAGS: None           WEIGHT BEARING RESTRICTIONS: No   FALLS:  Has patient fallen in last  6 months? No   LIVING ENVIRONMENT: 5 steps into the house   OCCUPATION:  Retired    PLOF: Independent   PATIENT GOALS:  To be able to her mother son dance by October at her sons wedding/ to be able to walk      OBJECTIVE:  Note: Objective measures were completed at Evaluation unless otherwise noted.   DIAGNOSTIC FINDINGS:  Nothing post op    PATIENT SURVEYS:  LEFS  Extreme difficulty/unable (0), Quite a bit of difficulty (1), Moderate difficulty (2), Little difficulty (3), No difficulty (4) Survey date:  eval 8/28 10/31 12/31- Rt knee 12/31- Lt knee  Any of your usual work, housework or school activities  4  0 3  2. Usual hobbies, recreational or sporting activities  3  0 3  3. Getting into/out of the bath  3  1 3   4. Walking  between rooms  3  1 3   5. Putting on socks/shoes  3  1 2   6. Squatting   3  1 2   7. Lifting an object, like a bag of groceries from the floor  2  1 2   8. Performing light activities around your home  1  1 2   9. Performing heavy activities around your home  2  0 2  10. Getting into/out of a car  3  2 3   11. Walking 2 blocks  1  1 2   12. Walking 1 mile  1  1 2   13. Going up/down 10 stairs (1 flight)  1  1 2   14. Standing for 1 hour  1  0 1  15.  sitting for 1 hour  1  1 1   16. Running on even ground  1  0 0  17. Running on uneven ground  1  0 0  18. Making sharp turns while running fast  1  0 0  19. Hopping   1  0 0  20. Rolling over in bed  2  1 3   Score total:  23/80 38/80 47/80  13/80 34/80      COGNITION: Overall cognitive status: Within functional limits for tasks assessed                               SENSATION:   EDEMA:  Circumferential: 44.5 R  48.7 L    MUSCLE LENGTH:   POSTURE: rounded shoulders and forward head   PALPATION: No unexpected TTP    LOWER EXTREMITY ROM:   Passive ROM Right eval Left eval left left Left  8/11  L  8/18 Left  10/31  Hip flexion           Hip extension           Hip abduction           Hip adduction           Hip internal rotation           Hip external rotation           Knee flexion   96  100 103 118  123 125  Knee extension    -3 -3      Ankle dorsiflexion           Ankle plantarflexion           Ankle inversion           Ankle eversion            (  Blank rows = not tested)   LOWER EXTREMITY MMT:   MMT (lb) Right 8/21 Left 8/21 Right  Left  Left  Right  10/31 Left 10/31 Rt/Lt 12/31  Hip flexion     4 4+ 4+ 27.0 27.5   Hip extension            Hip abduction     4 4+ 4+ 32.9 33.9 26.3/18.1  Hip adduction            Hip internal rotation            Hip external rotation            Knee flexion  35.3 38.8       31.0/34.5  Knee extension (age norm 60lb)   46.0 46.1 46.0 ( from previous visit)  46.1 ( from previous  visit)   32.7 47.1 46.2/57.4  Ankle dorsiflexion            Ankle plantarflexion            Ankle inversion            Ankle eversion             (Blank rows = not tested)       GAIT: Decreased left hip flexion Forward trunk flexion  Decreased left single leg                                                                                                                                    TREATMENT DATE:   [facility] PT  Treatment:     1/13 Nu step L4 x23min (LE's only) Long sit HSS 30sec x3 Standing calf stretch 20sec x2ea LAQ 2x10ea Supine SLR 2x10bil S/l hip abduction 2x10bil Step ups 2x10 4L only, 6 x5 HEP update and review    1/9  Manual:  Trigger point release to QL Trigger point release to gluteal  Trigger point release to lower lumbar spine  There-ex:  LTR 3x10  SAQ 3x10 2lbs LAQ 2lbs 3x10  Hip abduction 3x10   There-act Step up-hold left leg 3x12 4inch All with cuing for posture and core breathing.      PATIENT EDUCATION:  Education details:   Person educated: Patient Education method: Solicitor, Actor cues, Verbal cues Education comprehension: verbalized understanding, returned demonstration, verbal cues required, tactile cues required, and needs further education   HOME EXERCISE PROGRAM: Access Code: [email or web address] URL: [email or web address] Date: 03/22/2024 Prepared by: Harlene Cordon  Exercises - Seated Hamstring Stretch  - 2-3 x daily - 7 x weekly - 2 sets - 5 breaths hold - Seated Heel Slide  - 5 x daily - 7 x weekly - 1 sets - 10 reps - Standing Gluteal Sets  - 5 x daily - 7 x weekly - 1 sets - 10 reps - Standing Weight Shift Side to Side Eyes Closed  - 5 x daily - 7 x  weekly - 1 sets - 10 reps - Mini Squat  - 5 x daily - 7 x weekly - 1 sets - 10 reps - Sidelying Hip Abduction  - 3 x daily - 7 x weekly - 3 sets - 10 reps   ASSESSMENT:   CLINICAL IMPRESSION:  Focused on open chain strengthening for  bil hips with good tolerance. She dos have some discomfort in L shoulder when laying on her side, but able to alleviate this a little by rolling back onto pillow behind her. Pt educated on how to use pillow to support her in this position at home for sleeping/exercising. Pt felt greater weakness in L glute medius with s/l abduction compared to R. Performed step ups leading with L LE which were well tolerated. Instructed pt in how to perform these on bottom stair at home. Updated HEP to include supine and s/l SLR as well as LAQ. Will continue to progress as tolerated while monitoring pain level.    OBJECTIVE: Abnormal gait, decreased activity tolerance, decreased mobility, difficulty walking, decreased ROM, decreased strength, increased edema, increased muscle spasms, and pain.    ACTIVITY LIMITATIONS: carrying, lifting, bending, standing, squatting, stairs, transfers, bed mobility, bathing, and locomotion level   PARTICIPATION LIMITATIONS: meal prep, cleaning, laundry, driving, shopping, community activity, occupation, and yard work   PERSONAL FACTORS: 1-2 comorbidities: hip OA  are also affecting patient's functional outcome.    REHAB POTENTIAL: Good   CLINICAL DECISION MAKING: Stable/uncomplicated   EVALUATION COMPLEXITY: Low     GOALS: Goals reviewed with patient? Yes  04/13/2024     SHORT TERM GOALS: Target date:  Patient will increase passive knee flexion to 128 degrees on the left Baseline: Goal status: achived 10/31  2.  Patient will increase passive left knee extension to full Baseline:  Goal status: Achieved 9/11 3.  Patient will progress off assistive device and ambulate greater than 500 feet without significant antalgic gait Baseline:  Goal status: Still using cane.  Patient reports she does not feel safe off the cane yet 10/31   4.  Patient will be independent with HEP Baseline:  Goal status: Has initial HEP achieved 10/31     LONG TERM GOALS: Target date:  POC     Patient will go up and down 12 steps with reciprocal gait pattern without increased pain Baseline:  Goal status: ongoing- does ok but is uncomfortable    2.  Patient will squat pick items up off the ground without increased pain Baseline:  Goal status: improving, significant limitation by Rt knee, lacking necessary strength to fully do the motion   3.  Patient will ambulate community distances without pain Baseline:  Goal status: Lt knee is great, everything else makes it difficult    4.  Patient will have complete HEP to promote strengthening Baseline:  Goal status: requires further progression and management of Rt knee and left hip.       PLAN:   PT FREQUENCY: 1-22x/week   PT DURATION: POC date   PLANNED INTERVENTIONS: 97110-Therapeutic exercises, 97530- Therapeutic activity, [id number]- Neuromuscular re-education, 97535- Self Care, 02859- Manual therapy, [phone]- Gait training, [phone]- Aquatic Therapy, [id number]- , [phone]- Ultrasound, Patient/Family education, Stair training, Dry Needling, DME instructions, Cryotherapy, and Moist heat    PLAN FOR NEXT SESSION:  Prehab for Rt knee surgery 11/15/24, plan to d/c at 2/27 end date and utilize 2 week [facility] Trial.    Harlene Cordon, PT, DPT 09/01/2024, 1:07 PM  Referring diagnosis? Left TKA  Treatment diagnosis? (if different than referring diagnosis) LEft knee stiffness  Left knee pain  What was this (referring dx) caused by? [x]  Surgery []  Fall []  Ongoing issue []  Arthritis []  Other: ____________  Laterality: []  Rt [x]  Lt []  Both  Check all possible CPT codes:  *CHOOSE 10 OR LESS*    See Planned Interventions listed in the Plan section of the Evaluation.

## 2024-09-16 ENCOUNTER — Encounter (HOSPITAL_BASED_OUTPATIENT_CLINIC_OR_DEPARTMENT_OTHER): Payer: Self-pay | Admitting: Physical Therapy

## 2024-09-16 ENCOUNTER — Ambulatory Visit (HOSPITAL_BASED_OUTPATIENT_CLINIC_OR_DEPARTMENT_OTHER): Payer: Self-pay | Admitting: Physical Therapy

## 2024-09-16 DIAGNOSIS — R2689 Other abnormalities of gait and mobility: Secondary | ICD-10-CM | POA: Diagnosis not present

## 2024-09-16 DIAGNOSIS — M25562 Pain in left knee: Secondary | ICD-10-CM

## 2024-09-16 DIAGNOSIS — M6281 Muscle weakness (generalized): Secondary | ICD-10-CM

## 2024-09-16 NOTE — Therapy (Signed)
 [facility]    OUTPATIENT PHYSICAL THERAPY LOWER EXTREMITY TREATMENT     Patient Name: Latasha [name] MRN: [id number] DOB:August 19, 1961, 64 y.o., female Today's Date: 03/14/2024   END OF SESSION:     PT End of Session - 09/16/24 1409     Visit Number 38    Number of Visits 45    Date for Recertification  10/29/24    Authorization Type [employer] mcr Progress note to be done at 45    Authorization Time Period 14 visits 11/29  to 2/27    Authorization - Visit Number 8    Authorization - Number of Visits 14    Progress Note Due on Visit 36    PT Start Time 1402    PT Stop Time 1442    PT Time Calculation (min) 40 min    Activity Tolerance Patient tolerated treatment well    Behavior During Therapy [facility] for tasks assessed/performed                 Patient Active Problem List    Diagnosis Date Noted   History of total knee arthroplasty, left 03/01/2024   Tubular adenoma 02/25/2024   Severe obesity (BMI 35.0-39.9) with comorbidity (HCC) 10/12/2023   Pulmonary nodule 08/21/2023   Hx of total hip arthroplasty, right 10/03/2021   De Quervain's tenosynovitis, right 09/06/2020   B12 deficiency 07/13/2020   Family history of colon cancer 12/01/2017   Benign essential hypertension 03/01/2016   Herpes zoster without complication 03/01/2016   BP (high blood pressure) 09/05/2015   Osteoporosis, post-menopausal 09/05/2015   Postsurgical menopause 09/05/2015   Class 1 obesity 09/05/2015   GERD (gastroesophageal reflux disease) 09/05/2015   Endometriosis 09/05/2015   Status post total hysterectomy and bilateral salpingo-oophorectomy 09/05/2015   Primary osteoarthritis of both knees 09/28/2014    PCP: Oneil Pinal MD    REFERRING PROVIDER: Fonda Koyanagi  PA-C  Hooten, Lynwood Idalia Raddle., MD (surgeon)   REFERRING DIAG: Left TKA    THERAPY DIAG:  No diagnosis found.   Rationale for Evaluation and Treatment: Rehabilitation   ONSET DATE: DOS  03/01/2024    SUBJECTIVE:       SUBJECTIVE STATEMENT:  Pt reports feeling a little unsteady today with amb.  Worried that her rle seems weaker than left.  Pain right knee 1/10.  Sciatic nerve most limiting with pain 6/10  Eval: Pt received PT from 04-02/2024 for bilat knee OA.  She received aquatic therapy  Pt underwent L TKA on 03/01/2024.  At this plan her pain is well-controlled.  She has been at home health and done well.  She did not use the cane some at home.  She practiced using the cane at home with home health.  She is using a walker outside the house.  She has an exercise program that she has been working.  Will review the exercise program today.  Her chief complaint at this time is difficulty sleeping.  She was advised that this is normal at this point.      PERTINENT HISTORY: L TKA on 6/302025 OA R knee and L shoulder. Bilat shoulder pain R THA in 2023 HTN Osteoporosis Anxiety Anemia Chronic fatigue PAIN:  Are you having pain? Yes: NPRS scale: 4/10 Pain location: L knee Pain description:  Aggravating factors:  Relieving factors:     PRECAUTIONS: None   RED FLAGS: None           WEIGHT BEARING RESTRICTIONS: No   FALLS:  Has patient fallen in last 6 months? No   LIVING ENVIRONMENT: 5 steps into the house   OCCUPATION:  Retired    PLOF: Independent   PATIENT GOALS:  To be able to her mother son dance by October at her sons wedding/ to be able to walk      OBJECTIVE:  Note: Objective measures were completed at Evaluation unless otherwise noted.   DIAGNOSTIC FINDINGS:  Nothing post op    PATIENT SURVEYS:  LEFS  Extreme difficulty/unable (0), Quite a bit of difficulty (1), Moderate difficulty (2), Little difficulty (3), No difficulty (4) Survey date:  eval 8/28 10/31 12/31- Rt knee 12/31- Lt knee  Any of your usual work, housework or school activities  4  0 3  2. Usual hobbies, recreational or sporting activities  3  0 3  3. Getting into/out of the bath  3  1 3    4. Walking between rooms  3  1 3   5. Putting on socks/shoes  3  1 2   6. Squatting   3  1 2   7. Lifting an object, like a bag of groceries from the floor  2  1 2   8. Performing light activities around your home  1  1 2   9. Performing heavy activities around your home  2  0 2  10. Getting into/out of a car  3  2 3   11. Walking 2 blocks  1  1 2   12. Walking 1 mile  1  1 2   13. Going up/down 10 stairs (1 flight)  1  1 2   14. Standing for 1 hour  1  0 1  15.  sitting for 1 hour  1  1 1   16. Running on even ground  1  0 0  17. Running on uneven ground  1  0 0  18. Making sharp turns while running fast  1  0 0  19. Hopping   1  0 0  20. Rolling over in bed  2  1 3   Score total:  23/80 38/80 47/80  13/80 34/80      COGNITION: Overall cognitive status: Within functional limits for tasks assessed                               SENSATION:   EDEMA:  Circumferential: 44.5 R  48.7 L    MUSCLE LENGTH:   POSTURE: rounded shoulders and forward head   PALPATION: No unexpected TTP    LOWER EXTREMITY ROM:   Passive ROM Right eval Left eval left left Left  8/11  L  8/18 Left  10/31  Hip flexion           Hip extension           Hip abduction           Hip adduction           Hip internal rotation           Hip external rotation           Knee flexion   96  100 103 118  123 125  Knee extension    -3 -3      Ankle dorsiflexion           Ankle plantarflexion           Ankle inversion           Ankle eversion            (  Blank rows = not tested)   LOWER EXTREMITY MMT:   MMT (lb) Right 8/21 Left 8/21 Right  Left  Left  Right  10/31 Left 10/31 Rt/Lt 12/31  Hip flexion     4 4+ 4+ 27.0 27.5   Hip extension            Hip abduction     4 4+ 4+ 32.9 33.9 26.3/18.1  Hip adduction            Hip internal rotation            Hip external rotation            Knee flexion  35.3 38.8       31.0/34.5  Knee extension (age norm 60lb)   46.0 46.1 46.0 ( from previous visit)  46.1 (  from previous visit)   32.7 47.1 46.2/57.4  Ankle dorsiflexion            Ankle plantarflexion            Ankle inversion            Ankle eversion             (Blank rows = not tested)       GAIT: Decreased left hip flexion Forward trunk flexion  Decreased left single leg                                                                                                                                    TREATMENT DATE:   [facility] Adult PT  Treatment:    [facility] Adult PT Treatment:                                                DATE: 09/16/24 Pt seen for aquatic therapy today.  Treatment took place in water 3.5-4.75 ft in depth at the [facility] pool. Temp of water was 91.  Pt entered/exited the pool via stairs using step to pattern with hand rail.   *walking forward, back and side stepping in 3.6 ft with unsupported *step ups bottom step leading left x 10 then right ue support hand rails x 7 *calf stretch bottom step bil *resisted left hip abd and extension using ride band 2 x 10 *resisted clamshell in sitting as above 2 x 10 seated in step *hip hiking 2 x 10 left holding x 5 s *side stepping using rider band for resistance R/L *cycling on noodle  Pt requires the buoyancy and hydrostatic pressure of water for support, and to offload joints by unweighting joint load by at least 50 % in navel deep water and by at least 75-80% in chest to neck deep water.  Viscosity of the water is needed for resistance of strengthening. Water current perturbations provides challenge  to standing balance requiring increased core activation.       1/13 Nu step L4 x69min (LE's only) Long sit HSS 30sec x3 Standing calf stretch 20sec x2ea LAQ 2x10ea Supine SLR 2x10bil S/l hip abduction 2x10bil Step ups 2x10 4L only, 6 x5 HEP update and review    1/9  Manual:  Trigger point release to QL Trigger point release to gluteal  Trigger point release to lower lumbar spine  There-ex:  LTR  3x10  SAQ 3x10 2lbs LAQ 2lbs 3x10  Hip abduction 3x10   There-act Step up-hold left leg 3x12 4inch All with cuing for posture and core breathing.      PATIENT EDUCATION:  Education details:   Person educated: Patient Education method: Solicitor, Actor cues, Verbal cues Education comprehension: verbalized understanding, returned demonstration, verbal cues required, tactile cues required, and needs further education   HOME EXERCISE PROGRAM: Access Code: [email or web address] URL: [email or web address] Date: 03/22/2024 Prepared by: Harlene Cordon  Exercises - Seated Hamstring Stretch  - 2-3 x daily - 7 x weekly - 2 sets - 5 breaths hold - Seated Heel Slide  - 5 x daily - 7 x weekly - 1 sets - 10 reps - Standing Gluteal Sets  - 5 x daily - 7 x weekly - 1 sets - 10 reps - Standing Weight Shift Side to Side Eyes Closed  - 5 x daily - 7 x weekly - 1 sets - 10 reps - Mini Squat  - 5 x daily - 7 x weekly - 1 sets - 10 reps - Sidelying Hip Abduction  - 3 x daily - 7 x weekly - 3 sets - 10 reps   ASSESSMENT:   CLINICAL IMPRESSION: Pt with return to aquatics after being seen land based consistently over a few weeks.  She tolerating increased resisted le and hip engagement using riderband.  Extra focus on hip/glut strength. Pt provided cuing and demonstration for execution of ex using band Good session.  No complaints of pain.  Reports reduction in stiffness. Goals ongoing      OBJECTIVE: Abnormal gait, decreased activity tolerance, decreased mobility, difficulty walking, decreased ROM, decreased strength, increased edema, increased muscle spasms, and pain.    ACTIVITY LIMITATIONS: carrying, lifting, bending, standing, squatting, stairs, transfers, bed mobility, bathing, and locomotion level   PARTICIPATION LIMITATIONS: meal prep, cleaning, laundry, driving, shopping, community activity, occupation, and yard work   PERSONAL FACTORS: 1-2 comorbidities: hip OA   are also affecting patient's functional outcome.    REHAB POTENTIAL: Good   CLINICAL DECISION MAKING: Stable/uncomplicated   EVALUATION COMPLEXITY: Low     GOALS: Goals reviewed with patient? Yes  04/13/2024     SHORT TERM GOALS: Target date:  Patient will increase passive knee flexion to 128 degrees on the left Baseline: Goal status: achived 10/31  2.  Patient will increase passive left knee extension to full Baseline:  Goal status: Achieved 9/11 3.  Patient will progress off assistive device and ambulate greater than 500 feet without significant antalgic gait Baseline:  Goal status: Still using cane.  Patient reports she does not feel safe off the cane yet 10/31   4.  Patient will be independent with HEP Baseline:  Goal status: Has initial HEP achieved 10/31     LONG TERM GOALS: Target date: POC     Patient will go up and down 12 steps with reciprocal gait pattern without increased pain Baseline:  Goal status: ongoing- does ok but is uncomfortable    2.  Patient will squat pick items up off the ground without increased pain Baseline:  Goal status: improving, significant limitation by Rt knee, lacking necessary strength to fully do the motion   3.  Patient will ambulate community distances without pain Baseline:  Goal status: Lt knee is great, everything else makes it difficult    4.  Patient will have complete HEP to promote strengthening Baseline:  Goal status: requires further progression and management of Rt knee and left hip.       PLAN:   PT FREQUENCY: 1-22x/week   PT DURATION: POC date   PLANNED INTERVENTIONS: 97110-Therapeutic exercises, 97530- Therapeutic activity, 97112- Neuromuscular re-education, [phone]- Self Care, 02859- Manual therapy, [phone]- Gait training, [phone]- Aquatic Therapy, [id number]- , [phone]- Ultrasound, Patient/Family education, Stair training, Dry Needling, DME instructions, Cryotherapy, and Moist heat    PLAN FOR NEXT SESSION:   Prehab for Rt knee surgery 11/15/24, plan to d/c at 2/27 end date and utilize 2 week [facility] Trial.    Ronal [place]) Rabiah Goeser MPT 09/16/24 2:49 PM [facility] [address] [place], [place], [address] Phone: [phone]   Fax:  [phone]            Referring diagnosis? Left TKA  Treatment diagnosis? (if different than referring diagnosis) LEft knee stiffness  Left knee pain  What was this (referring dx) caused by? [x]  Surgery []  Fall []  Ongoing issue []  Arthritis []  Other: ____________  Laterality: []  Rt [x]  Lt []  Both  Check all possible CPT codes:  *CHOOSE 10 OR LESS*    See Planned Interventions listed in the Plan section of the Evaluation.

## 2024-09-21 ENCOUNTER — Ambulatory Visit (HOSPITAL_BASED_OUTPATIENT_CLINIC_OR_DEPARTMENT_OTHER): Payer: Self-pay | Admitting: Physical Therapy

## 2024-09-21 ENCOUNTER — Encounter (HOSPITAL_BASED_OUTPATIENT_CLINIC_OR_DEPARTMENT_OTHER): Payer: Self-pay | Admitting: Physical Therapy

## 2024-09-21 DIAGNOSIS — R2689 Other abnormalities of gait and mobility: Secondary | ICD-10-CM

## 2024-09-21 DIAGNOSIS — M25562 Pain in left knee: Secondary | ICD-10-CM

## 2024-09-21 DIAGNOSIS — M6281 Muscle weakness (generalized): Secondary | ICD-10-CM

## 2024-09-21 NOTE — Therapy (Signed)
 [facility]    OUTPATIENT PHYSICAL THERAPY LOWER EXTREMITY TREATMENT     Patient Name: Latasha [name] MRN: [id number] DOB:March 25, 1961, 64 y.o., female Today's Date: 03/14/2024   END OF SESSION:     PT End of Session - 09/21/24 1538     Visit Number 39    Number of Visits 45    Date for Recertification  10/29/24    Authorization Type [employer] m[employer] Progress note to be done at 45    Authorization Time Period 14 visits 11/29  to 2/27    Authorization - Visit Number 9    Authorization - Number of Visits 14    Progress Note Due on Visit 45    PT Start Time 1530    PT Stop Time 1612    PT Time Calculation (min) 42 min    Activity Tolerance Patient tolerated treatment well    Behavior During Therapy [facility] for tasks assessed/performed                 Patient Active Problem List    Diagnosis Date Noted   History of total knee arthroplasty, left 03/01/2024   Tubular adenoma 02/25/2024   Severe obesity (BMI 35.0-39.9) with comorbidity (HCC) 10/12/2023   Pulmonary nodule 08/21/2023   Hx of total hip arthroplasty, right 10/03/2021   De Quervain's tenosynovitis, right 09/06/2020   B12 deficiency 07/13/2020   Family history of colon cancer 12/01/2017   Benign essential hypertension 03/01/2016   Herpes zoster without complication 03/01/2016   BP (high blood pressure) 09/05/2015   Osteoporosis, post-menopausal 09/05/2015   Postsurgical menopause 09/05/2015   Class 1 obesity 09/05/2015   GERD (gastroesophageal reflux disease) 09/05/2015   Endometriosis 09/05/2015   Status post total hysterectomy and bilateral salpingo-oophorectomy 09/05/2015   Primary osteoarthritis of both knees 09/28/2014    PCP: Oneil Pinal MD    REFERRING PROVIDER: Fonda Koyanagi  PA-C  Hooten, Lynwood Idalia Raddle., MD (surgeon)   REFERRING DIAG: Left TKA    THERAPY DIAG:  No diagnosis found.   Rationale for Evaluation and Treatment: Rehabilitation   ONSET DATE: DOS  03/01/2024    SUBJECTIVE:       SUBJECTIVE STATEMENT:  Pt reports  that her sciatica on LLE has been bothersome.  She  reports no new changes since last visit.    Eval: Pt received PT from 04-02/2024 for bilat knee OA.  She received aquatic therapy  Pt underwent L TKA on 03/01/2024.  At this plan her pain is well-controlled.  She has been at home health and done well.  She did not use the cane some at home.  She practiced using the cane at home with home health.  She is using a walker outside the house.  She has an exercise program that she has been working.  Will review the exercise program today.  Her chief complaint at this time is difficulty sleeping.  She was advised that this is normal at this point.      PERTINENT HISTORY: L TKA on 6/302025 OA R knee and L shoulder. Bilat shoulder pain R THA in 2023 HTN Osteoporosis Anxiety Anemia Chronic fatigue PAIN:  Are you having pain? Yes: NPRS scale: 3/10 Pain location: L knee, Lt buttocks, back of LE Pain description: ache Aggravating factors: prolonged positions Relieving factors:     PRECAUTIONS: None   RED FLAGS: None           WEIGHT BEARING RESTRICTIONS: No   FALLS:  Has patient  fallen in last 6 months? No   LIVING ENVIRONMENT: 5 steps into the house   OCCUPATION:  Retired    PLOF: Independent   PATIENT GOALS:  To be able to her mother son dance by October at her sons wedding/ to be able to walk      OBJECTIVE:  Note: Objective measures were completed at Evaluation unless otherwise noted.   DIAGNOSTIC FINDINGS:  Nothing post op    PATIENT SURVEYS:  LEFS  Extreme difficulty/unable (0), Quite a bit of difficulty (1), Moderate difficulty (2), Little difficulty (3), No difficulty (4) Survey date:  eval 8/28 10/31 12/31- Rt knee 12/31- Lt knee  Any of your usual work, housework or school activities  4  0 3  2. Usual hobbies, recreational or sporting activities  3  0 3  3. Getting into/out of the bath  3  1 3   4.  Walking between rooms  3  1 3   5. Putting on socks/shoes  3  1 2   6. Squatting   3  1 2   7. Lifting an object, like a bag of groceries from the floor  2  1 2   8. Performing light activities around your home  1  1 2   9. Performing heavy activities around your home  2  0 2  10. Getting into/out of a car  3  2 3   11. Walking 2 blocks  1  1 2   12. Walking 1 mile  1  1 2   13. Going up/down 10 stairs (1 flight)  1  1 2   14. Standing for 1 hour  1  0 1  15.  sitting for 1 hour  1  1 1   16. Running on even ground  1  0 0  17. Running on uneven ground  1  0 0  18. Making sharp turns while running fast  1  0 0  19. Hopping   1  0 0  20. Rolling over in bed  2  1 3   Score total:  23/80 38/80 47/80  13/80 34/80      COGNITION: Overall cognitive status: Within functional limits for tasks assessed                               SENSATION:   EDEMA:  Circumferential: 44.5 R  48.7 L    MUSCLE LENGTH:   POSTURE: rounded shoulders and forward head   PALPATION: No unexpected TTP    LOWER EXTREMITY ROM:   Passive ROM Right eval Left eval left left Left  8/11  L  8/18 Left  10/31  Hip flexion           Hip extension           Hip abduction           Hip adduction           Hip internal rotation           Hip external rotation           Knee flexion   96  100 103 118  123 125  Knee extension    -3 -3      Ankle dorsiflexion           Ankle plantarflexion           Ankle inversion           Ankle eversion            (  Blank rows = not tested)   LOWER EXTREMITY MMT:   MMT (lb) Right 8/21 Left 8/21 Right  Left  Left  Right  10/31 Left 10/31 Rt/Lt 12/31  Hip flexion     4 4+ 4+ 27.0 27.5   Hip extension            Hip abduction     4 4+ 4+ 32.9 33.9 26.3/18.1  Hip adduction            Hip internal rotation            Hip external rotation            Knee flexion  35.3 38.8       31.0/34.5  Knee extension (age norm 60lb)   46.0 46.1 46.0 ( from previous visit)  46.1 ( from  previous visit)   32.7 47.1 46.2/57.4  Ankle dorsiflexion            Ankle plantarflexion            Ankle inversion            Ankle eversion             (Blank rows = not tested)       GAIT: Decreased left hip flexion Forward trunk flexion  Decreased left single leg                                                                                                                                    TREATMENT DATE:   [facility] Adult PT  Treatment:    [facility] Adult PT Treatment:                                                DATE: 09/21/24 Pt seen for aquatic therapy today.  Treatment took place in water 3.5-4.75 ft in depth at the [facility] pool. Temp of water was 91.  Pt entered/exited the pool via stairs using step to pattern with hand rail.   *walking forward, back and side stepping in 3.6 ft with unsupported * UE On yellow hand floats:  heel/toe raises;  hip abdct/ add x 15 each LE * squats pushing single yellow hand float under water 2x10 * STS from bench in water to blue water step x 10, eccentric lowering to step * L/R fig 4 stretch x 15sec x 2 each LE * tandem gait forward/ backward without UE support  *resisted left hip abd and extension using ride band 2 x 10 *L/R forward step ups with light UE support on rails x 10 each *cycling on noodle  [facility] Adult PT Treatment:  DATE: 09/16/24 Pt seen for aquatic therapy today.  Treatment took place in water 3.5-4.75 ft in depth at the [facility] pool. Temp of water was 91.  Pt entered/exited the pool via stairs using step to pattern with hand rail.   *walking forward, back and side stepping in 3.6 ft with unsupported *step ups bottom step leading left x 10 then right ue support hand rails x 7 *calf stretch bottom step bil *resisted left hip abd and extension using ride band 2 x 10 *resisted clamshell in sitting as above 2 x 10 seated in step *hip hiking 2 x 10 left  holding x 5 s *side stepping using rider band for resistance R/L *cycling on noodle  Pt requires the buoyancy and hydrostatic pressure of water for support, and to offload joints by unweighting joint load by at least 50 % in navel deep water and by at least 75-80% in chest to neck deep water.  Viscosity of the water is needed for resistance of strengthening. Water current perturbations provides challenge to standing balance requiring increased core activation.       1/13 Nu step L4 x62min (LE's only) Long sit HSS 30sec x3 Standing calf stretch 20sec x2ea LAQ 2x10ea Supine SLR 2x10bil S/l hip abduction 2x10bil Step ups 2x10 4L only, 6 x5 HEP update and review    1/9  Manual:  Trigger point release to QL Trigger point release to gluteal  Trigger point release to lower lumbar spine  There-ex:  LTR 3x10  SAQ 3x10 2lbs LAQ 2lbs 3x10  Hip abduction 3x10   There-act Step up-hold left leg 3x12 4inch All with cuing for posture and core breathing.      PATIENT EDUCATION:  Education details:  reacquainting to aquatic therapy Person educated: Patient Education method: Explanation, Demonstration, Tactile cues, Verbal cues Education comprehension: verbalized understanding, returned demonstration, verbal cues required, tactile cues required, and needs further education   HOME EXERCISE PROGRAM: Access Code: [email or web address] URL: [email or web address] Date: 03/22/2024 Prepared by: Harlene Cordon  Exercises - Seated Hamstring Stretch  - 2-3 x daily - 7 x weekly - 2 sets - 5 breaths hold - Seated Heel Slide  - 5 x daily - 7 x weekly - 1 sets - 10 reps - Standing Gluteal Sets  - 5 x daily - 7 x weekly - 1 sets - 10 reps - Standing Weight Shift Side to Side Eyes Closed  - 5 x daily - 7 x weekly - 1 sets - 10 reps - Mini Squat  - 5 x daily - 7 x weekly - 1 sets - 10 reps - Sidelying Hip Abduction  - 3 x daily - 7 x weekly - 3 sets - 10 reps   ASSESSMENT:   CLINICAL  IMPRESSION: Continued focus on hip/glut strengthening in preparation for upcoming Rt knee surgery. Pt reported reduction of tightness and no increase in pain. Good tolerance for session.   Pt is making gradual progress towards remaining goals.      OBJECTIVE: Abnormal gait, decreased activity tolerance, decreased mobility, difficulty walking, decreased ROM, decreased strength, increased edema, increased muscle spasms, and pain.    ACTIVITY LIMITATIONS: carrying, lifting, bending, standing, squatting, stairs, transfers, bed mobility, bathing, and locomotion level   PARTICIPATION LIMITATIONS: meal prep, cleaning, laundry, driving, shopping, community activity, occupation, and yard work   PERSONAL FACTORS: 1-2 comorbidities: hip OA  are also affecting patient's functional outcome.    REHAB POTENTIAL: Good   CLINICAL DECISION MAKING: Stable/uncomplicated   EVALUATION COMPLEXITY:  Low     GOALS: Goals reviewed with patient? Yes  04/13/2024     SHORT TERM GOALS: Target date:  Patient will increase passive knee flexion to 128 degrees on the left Baseline: Goal status: achieved 10/31  2.  Patient will increase passive left knee extension to full Baseline:  Goal status: Achieved 9/11 3.  Patient will progress off assistive device and ambulate greater than 500 feet without significant antalgic gait Baseline:  Goal status: uses cane for long distances-  09/21/24   4.  Patient will be independent with HEP Baseline:  Goal status: achieved 10/31     LONG TERM GOALS: Target date: POC     Patient will go up and down 12 steps with reciprocal gait pattern without increased pain Baseline:  Goal status: ongoing- does ok but is uncomfortable    2.  Patient will squat pick items up off the ground without increased pain Baseline:  Goal status: improving, significant limitation by Rt knee, lacking necessary strength to fully do the motion   3.  Patient will ambulate community distances  without pain Baseline:  Goal status: Lt knee is great, everything else makes it difficult    4.  Patient will have complete HEP to promote strengthening Baseline:  Goal status: requires further progression and management of Rt knee and left hip.       PLAN:   PT FREQUENCY: 1-22x/week   PT DURATION: POC date   PLANNED INTERVENTIONS: 97110-Therapeutic exercises, 97530- Therapeutic activity, 97112- Neuromuscular re-education, [phone]- Self Care, 02859- Manual therapy, [phone]- Gait training, [phone]- Aquatic Therapy, [id number]- , [phone]- Ultrasound, Patient/Family education, Stair training, Dry Needling, DME instructions, Cryotherapy, and Moist heat    PLAN FOR NEXT SESSION:  Prehab for Rt knee surgery 11/15/24, plan to d/c at 2/27 end date and utilize 2 week [facility] Trial.    Delon Aquas, PTA 09/21/24 4:18 PM [facility] [address] [place], [place], [address] Phone: [phone]   Fax:  [phone]        Referring diagnosis? Left TKA  Treatment diagnosis? (if different than referring diagnosis) LEft knee stiffness  Left knee pain  What was this (referring dx) caused by? [x]  Surgery []  Fall []  Ongoing issue []  Arthritis []  Other: ____________  Laterality: []  Rt [x]  Lt []  Both  Check all possible CPT codes:  *CHOOSE 10 OR LESS*    See Planned Interventions listed in the Plan section of the Evaluation.

## 2024-09-23 ENCOUNTER — Ambulatory Visit (HOSPITAL_BASED_OUTPATIENT_CLINIC_OR_DEPARTMENT_OTHER): Payer: Self-pay | Admitting: Physical Therapy

## 2024-09-23 DIAGNOSIS — R2689 Other abnormalities of gait and mobility: Secondary | ICD-10-CM

## 2024-09-23 DIAGNOSIS — M25562 Pain in left knee: Secondary | ICD-10-CM

## 2024-09-23 DIAGNOSIS — M6281 Muscle weakness (generalized): Secondary | ICD-10-CM

## 2024-09-23 DIAGNOSIS — M25662 Stiffness of left knee, not elsewhere classified: Secondary | ICD-10-CM

## 2024-09-24 ENCOUNTER — Encounter (HOSPITAL_BASED_OUTPATIENT_CLINIC_OR_DEPARTMENT_OTHER): Payer: Self-pay | Admitting: Physical Therapy

## 2024-09-24 NOTE — Therapy (Signed)
 [facility]    OUTPATIENT PHYSICAL THERAPY LOWER EXTREMITY TREATMENT     Patient Name: Latasha [name] MRN: [id number] DOB:01/26/1961, 64 y.o., female Today's Date: 03/14/2024   END OF SESSION:     PT End of Session - 09/24/24 0916     Visit Number 40    Number of Visits 45    Date for Recertification  10/29/24    Authorization Type [employer] mcr Progress note to be done at 45    PT Start Time 1230    PT Stop Time 1311    PT Time Calculation (min) 41 min    Activity Tolerance Patient tolerated treatment well    Behavior During Therapy [facility] for tasks assessed/performed                 Patient Active Problem List    Diagnosis Date Noted   History of total knee arthroplasty, left 03/01/2024   Tubular adenoma 02/25/2024   Severe obesity (BMI 35.0-39.9) with comorbidity (HCC) 10/12/2023   Pulmonary nodule 08/21/2023   Hx of total hip arthroplasty, right 10/03/2021   De Quervain's tenosynovitis, right 09/06/2020   B12 deficiency 07/13/2020   Family history of colon cancer 12/01/2017   Benign essential hypertension 03/01/2016   Herpes zoster without complication 03/01/2016   BP (high blood pressure) 09/05/2015   Osteoporosis, post-menopausal 09/05/2015   Postsurgical menopause 09/05/2015   Class 1 obesity 09/05/2015   GERD (gastroesophageal reflux disease) 09/05/2015   Endometriosis 09/05/2015   Status post total hysterectomy and bilateral salpingo-oophorectomy 09/05/2015   Primary osteoarthritis of both knees 09/28/2014    PCP: Oneil Pinal MD    REFERRING PROVIDER: Fonda Koyanagi  PA-C  Hooten, Lynwood Idalia Raddle., MD (surgeon)   REFERRING DIAG: Left TKA    THERAPY DIAG:  No diagnosis found.   Rationale for Evaluation and Treatment: Rehabilitation   ONSET DATE: DOS  03/01/2024   SUBJECTIVE:       SUBJECTIVE STATEMENT:  The patient feels like she is having more pain with her sciatica over the past few days on the left. Her knee on the right  is also hurting her. She feels like the pool has helped significantly.  Eval: Pt received PT from 04-02/2024 for bilat knee OA.  She received aquatic therapy  Pt underwent L TKA on 03/01/2024.  At this plan her pain is well-controlled.  She has been at home health and done well.  She did not use the cane some at home.  She practiced using the cane at home with home health.  She is using a walker outside the house.  She has an exercise program that she has been working.  Will review the exercise program today.  Her chief complaint at this time is difficulty sleeping.  She was advised that this is normal at this point.      PERTINENT HISTORY: L TKA on 6/302025 OA R knee and L shoulder. Bilat shoulder pain R THA in 2023 HTN Osteoporosis Anxiety Anemia Chronic fatigue PAIN:  Are you having pain? Yes: NPRS scale: 3/10 Pain location: L knee, Lt buttocks, back of LE Pain description: ache Aggravating factors: prolonged positions Relieving factors:     PRECAUTIONS: None   RED FLAGS: None           WEIGHT BEARING RESTRICTIONS: No   FALLS:  Has patient fallen in last 6 months? No   LIVING ENVIRONMENT: 5 steps into the house   OCCUPATION:  Retired    PLOF:  Independent   PATIENT GOALS:  To be able to her mother son dance by October at her sons wedding/ to be able to walk      OBJECTIVE:  Note: Objective measures were completed at Evaluation unless otherwise noted.   DIAGNOSTIC FINDINGS:  Nothing post op    PATIENT SURVEYS:  LEFS  Extreme difficulty/unable (0), Quite a bit of difficulty (1), Moderate difficulty (2), Little difficulty (3), No difficulty (4) Survey date:  eval 8/28 10/31 12/31- Rt knee 12/31- Lt knee  Any of your usual work, housework or school activities  4  0 3  2. Usual hobbies, recreational or sporting activities  3  0 3  3. Getting into/out of the bath  3  1 3   4. Walking between rooms  3  1 3   5. Putting on socks/shoes  3  1 2   6. Squatting   3  1 2    7. Lifting an object, like a bag of groceries from the floor  2  1 2   8. Performing light activities around your home  1  1 2   9. Performing heavy activities around your home  2  0 2  10. Getting into/out of a car  3  2 3   11. Walking 2 blocks  1  1 2   12. Walking 1 mile  1  1 2   13. Going up/down 10 stairs (1 flight)  1  1 2   14. Standing for 1 hour  1  0 1  15.  sitting for 1 hour  1  1 1   16. Running on even ground  1  0 0  17. Running on uneven ground  1  0 0  18. Making sharp turns while running fast  1  0 0  19. Hopping   1  0 0  20. Rolling over in bed  2  1 3   Score total:  23/80 38/80 47/80  13/80 34/80      COGNITION: Overall cognitive status: Within functional limits for tasks assessed                               SENSATION:   EDEMA:  Circumferential: 44.5 R  48.7 L    MUSCLE LENGTH:   POSTURE: rounded shoulders and forward head   PALPATION: No unexpected TTP    LOWER EXTREMITY ROM:   Passive ROM Right eval Left eval left left Left  8/11  L  8/18 Left  10/31  Hip flexion           Hip extension           Hip abduction           Hip adduction           Hip internal rotation           Hip external rotation           Knee flexion   96  100 103 118  123 125  Knee extension    -3 -3      Ankle dorsiflexion           Ankle plantarflexion           Ankle inversion           Ankle eversion            (Blank rows = not tested)   LOWER EXTREMITY MMT:   MMT (lb) Right 8/21  Left 8/21 Right  Left  Left  Right  10/31 Left 10/31 Rt/Lt 12/31  Hip flexion     4 4+ 4+ 27.0 27.5   Hip extension            Hip abduction     4 4+ 4+ 32.9 33.9 26.3/18.1  Hip adduction            Hip internal rotation            Hip external rotation            Knee flexion  35.3 38.8       31.0/34.5  Knee extension (age norm 60lb)   46.0 46.1 46.0 ( from previous visit)  46.1 ( from previous visit)   32.7 47.1 46.2/57.4  Ankle dorsiflexion            Ankle  plantarflexion            Ankle inversion            Ankle eversion             (Blank rows = not tested)       GAIT: Decreased left hip flexion Forward trunk flexion  Decreased left single leg                                                                                                                                    TREATMENT DATE:   [facility] Adult PT  Treatment:    1/23 Manual:  Trigger point release to QL Trigger point release to gluteal  Trigger point release to lower lumbar spine  There-ex:  LTR 3x10  SAQ 3x10 2lbs bilateral  LAQ 2lbs 3x10 right only  Hip abduction 3x10 bilateral    [facility] Adult PT Treatment:                                                DATE: 09/21/24 Pt seen for aquatic therapy today.  Treatment took place in water 3.5-4.75 ft in depth at the [facility] pool. Temp of water was 91.  Pt entered/exited the pool via stairs using step to pattern with hand rail.   *walking forward, back and side stepping in 3.6 ft with unsupported * UE On yellow hand floats:  heel/toe raises;  hip abdct/ add x 15 each LE * squats pushing single yellow hand float under water 2x10 * STS from bench in water to blue water step x 10, eccentric lowering to step * L/R fig 4 stretch x 15sec x 2 each LE * tandem gait forward/ backward without UE support  *resisted left hip abd and extension using ride band 2 x 10 *L/R forward step ups with light UE support on rails x 10 each *cycling on noodle  [facility]  Adult PT Treatment:                                                DATE: 09/16/24 Pt seen for aquatic therapy today.  Treatment took place in water 3.5-4.75 ft in depth at the [facility] pool. Temp of water was 91.  Pt entered/exited the pool via stairs using step to pattern with hand rail.   *walking forward, back and side stepping in 3.6 ft with unsupported *step ups bottom step leading left x 10 then right ue support hand rails x 7 *calf stretch bottom  step bil *resisted left hip abd and extension using ride band 2 x 10 *resisted clamshell in sitting as above 2 x 10 seated in step *hip hiking 2 x 10 left holding x 5 s *side stepping using rider band for resistance R/L *cycling on noodle  Pt requires the buoyancy and hydrostatic pressure of water for support, and to offload joints by unweighting joint load by at least 50 % in navel deep water and by at least 75-80% in chest to neck deep water.  Viscosity of the water is needed for resistance of strengthening. Water current perturbations provides challenge to standing balance requiring increased core activation.       1/13 Nu step L4 x30min (LE's only) Long sit HSS 30sec x3 Standing calf stretch 20sec x2ea LAQ 2x10ea Supine SLR 2x10bil S/l hip abduction 2x10bil Step ups 2x10 4L only, 6 x5 HEP update and review    1/9  Manual:  Trigger point release to QL Trigger point release to gluteal  Trigger point release to lower lumbar spine  There-ex:  LTR 3x10  SAQ 3x10 2lbs LAQ 2lbs 3x10  Hip abduction 3x10   There-act Step up-hold left leg 3x12 4inch All with cuing for posture and core breathing.      PATIENT EDUCATION:  Education details:  reacquainting to aquatic therapy Person educated: Patient Education method: Explanation, Demonstration, Tactile cues, Verbal cues Education comprehension: verbalized understanding, returned demonstration, verbal cues required, tactile cues required, and needs further education   HOME EXERCISE PROGRAM: Access Code: [email or web address] URL: [email or web address] Date: 03/22/2024 Prepared by: Harlene Cordon  Exercises - Seated Hamstring Stretch  - 2-3 x daily - 7 x weekly - 2 sets - 5 breaths hold - Seated Heel Slide  - 5 x daily - 7 x weekly - 1 sets - 10 reps - Standing Gluteal Sets  - 5 x daily - 7 x weekly - 1 sets - 10 reps - Standing Weight Shift Side to Side Eyes Closed  - 5 x daily - 7 x weekly - 1 sets - 10 reps -  Mini Squat  - 5 x daily - 7 x weekly - 1 sets - 10 reps - Sidelying Hip Abduction  - 3 x daily - 7 x weekly - 3 sets - 10 reps   ASSESSMENT:   CLINICAL IMPRESSION: The patient tolerated treatment well. She continues to have significant pain in her right knee. That will be replaced in March. She was encouraged to strengthen as much as she can in the surgery. We strengthened both legs today. She continues to have spasming in her left gluteal and lower back. Therapy will continue to progress as tolerated.     OBJECTIVE: Abnormal gait, decreased activity tolerance, decreased mobility, difficulty walking, decreased ROM, decreased strength, increased  edema, increased muscle spasms, and pain.    ACTIVITY LIMITATIONS: carrying, lifting, bending, standing, squatting, stairs, transfers, bed mobility, bathing, and locomotion level   PARTICIPATION LIMITATIONS: meal prep, cleaning, laundry, driving, shopping, community activity, occupation, and yard work   PERSONAL FACTORS: 1-2 comorbidities: hip OA  are also affecting patient's functional outcome.    REHAB POTENTIAL: Good   CLINICAL DECISION MAKING: Stable/uncomplicated   EVALUATION COMPLEXITY: Low     GOALS: Goals reviewed with patient? Yes  04/13/2024     SHORT TERM GOALS: Target date:  Patient will increase passive knee flexion to 128 degrees on the left Baseline: Goal status: achieved 10/31  2.  Patient will increase passive left knee extension to full Baseline:  Goal status: Achieved 9/11 3.  Patient will progress off assistive device and ambulate greater than 500 feet without significant antalgic gait Baseline:  Goal status: uses cane for long distances-  09/21/24   4.  Patient will be independent with HEP Baseline:  Goal status: achieved 10/31     LONG TERM GOALS: Target date: POC     Patient will go up and down 12 steps with reciprocal gait pattern without increased pain Baseline:  Goal status: ongoing- does ok but is  uncomfortable    2.  Patient will squat pick items up off the ground without increased pain Baseline:  Goal status: improving, significant limitation by Rt knee, lacking necessary strength to fully do the motion   3.  Patient will ambulate community distances without pain Baseline:  Goal status: Lt knee is great, everything else makes it difficult    4.  Patient will have complete HEP to promote strengthening Baseline:  Goal status: requires further progression and management of Rt knee and left hip.       PLAN:   PT FREQUENCY: 1-22x/week   PT DURATION: POC date   PLANNED INTERVENTIONS: 97110-Therapeutic exercises, 97530- Therapeutic activity, 97112- Neuromuscular re-education, [phone]- Self Care, 02859- Manual therapy, [phone]- Gait training, [phone]- Aquatic Therapy, [id number]- , [phone]- Ultrasound, Patient/Family education, Stair training, Dry Needling, DME instructions, Cryotherapy, and Moist heat    PLAN FOR NEXT SESSION:  Prehab for Rt knee surgery 11/15/24, plan to d/c at 2/27 end date and utilize 2 week [facility] Trial.    Delon Aquas, PTA 09/24/24 10:53 AM [facility] [address] [place], [place], [address] Phone: [phone]   Fax:  [phone]        Referring diagnosis? Left TKA  Treatment diagnosis? (if different than referring diagnosis) LEft knee stiffness  Left knee pain  What was this (referring dx) caused by? [x]  Surgery []  Fall []  Ongoing issue []  Arthritis []  Other: ____________  Laterality: []  Rt [x]  Lt []  Both  Check all possible CPT codes:  *CHOOSE 10 OR LESS*    See Planned Interventions listed in the Plan section of the Evaluation.

## 2024-09-29 ENCOUNTER — Ambulatory Visit (HOSPITAL_BASED_OUTPATIENT_CLINIC_OR_DEPARTMENT_OTHER): Admitting: Physical Therapy

## 2024-09-30 ENCOUNTER — Encounter (HOSPITAL_BASED_OUTPATIENT_CLINIC_OR_DEPARTMENT_OTHER): Payer: Self-pay | Admitting: Physical Therapy

## 2024-09-30 ENCOUNTER — Ambulatory Visit (HOSPITAL_BASED_OUTPATIENT_CLINIC_OR_DEPARTMENT_OTHER): Admitting: Physical Therapy

## 2024-09-30 ENCOUNTER — Other Ambulatory Visit: Payer: Self-pay | Admitting: Internal Medicine

## 2024-09-30 DIAGNOSIS — M6281 Muscle weakness (generalized): Secondary | ICD-10-CM

## 2024-09-30 DIAGNOSIS — Z0000 Encounter for general adult medical examination without abnormal findings: Secondary | ICD-10-CM

## 2024-09-30 DIAGNOSIS — R2689 Other abnormalities of gait and mobility: Secondary | ICD-10-CM | POA: Diagnosis not present

## 2024-09-30 DIAGNOSIS — R911 Solitary pulmonary nodule: Secondary | ICD-10-CM

## 2024-09-30 DIAGNOSIS — M25562 Pain in left knee: Secondary | ICD-10-CM

## 2024-09-30 NOTE — Therapy (Signed)
 [facility]    OUTPATIENT PHYSICAL THERAPY LOWER EXTREMITY TREATMENT     Patient Name: Latasha [name] MRN: [id number] DOB:1961/05/10, 64 y.o., female Today's Date: 03/14/2024   END OF SESSION:     PT End of Session - 09/30/24 1146     Visit Number 41    Number of Visits 45    Date for Recertification  10/29/24    Authorization Type [employer] mcr Progress note to be done at 45    PT Start Time 1101    PT Stop Time 1141    PT Time Calculation (min) 40 min    Activity Tolerance Patient tolerated treatment well    Behavior During Therapy [facility] for tasks assessed/performed                  Patient Active Problem List    Diagnosis Date Noted   History of total knee arthroplasty, left 03/01/2024   Tubular adenoma 02/25/2024   Severe obesity (BMI 35.0-39.9) with comorbidity (HCC) 10/12/2023   Pulmonary nodule 08/21/2023   Hx of total hip arthroplasty, right 10/03/2021   De Quervain's tenosynovitis, right 09/06/2020   B12 deficiency 07/13/2020   Family history of colon cancer 12/01/2017   Benign essential hypertension 03/01/2016   Herpes zoster without complication 03/01/2016   BP (high blood pressure) 09/05/2015   Osteoporosis, post-menopausal 09/05/2015   Postsurgical menopause 09/05/2015   Class 1 obesity 09/05/2015   GERD (gastroesophageal reflux disease) 09/05/2015   Endometriosis 09/05/2015   Status post total hysterectomy and bilateral salpingo-oophorectomy 09/05/2015   Primary osteoarthritis of both knees 09/28/2014    PCP: Oneil Pinal MD    REFERRING PROVIDER: Fonda Koyanagi  PA-C  Hooten, Lynwood Idalia Raddle., MD (surgeon)   REFERRING DIAG: Left TKA    THERAPY DIAG:  No diagnosis found.   Rationale for Evaluation and Treatment: Rehabilitation   ONSET DATE: DOS  03/01/2024   SUBJECTIVE:       SUBJECTIVE STATEMENT:  Rt knee pain 5/10, Lt 2/10.  Bad cramping along the outside of my thigh, Knee and foot at night   Eval: Pt received  PT from 04-02/2024 for bilat knee OA.  She received aquatic therapy  Pt underwent L TKA on 03/01/2024.  At this plan her pain is well-controlled.  She has been at home health and done well.  She did not use the cane some at home.  She practiced using the cane at home with home health.  She is using a walker outside the house.  She has an exercise program that she has been working.  Will review the exercise program today.  Her chief complaint at this time is difficulty sleeping.  She was advised that this is normal at this point.      PERTINENT HISTORY: L TKA on 6/302025 OA R knee and L shoulder. Bilat shoulder pain R THA in 2023 HTN Osteoporosis Anxiety Anemia Chronic fatigue PAIN:  Are you having pain? Yes: NPRS scale: 3/10 Pain location: L knee, Lt buttocks, back of LE Pain description: ache Aggravating factors: prolonged positions Relieving factors:     PRECAUTIONS: None   RED FLAGS: None           WEIGHT BEARING RESTRICTIONS: No   FALLS:  Has patient fallen in last 6 months? No   LIVING ENVIRONMENT: 5 steps into the house   OCCUPATION:  Retired    PLOF: Independent   PATIENT GOALS:  To be able to her mother son dance by  October at her sons wedding/ to be able to walk      OBJECTIVE:  Note: Objective measures were completed at Evaluation unless otherwise noted.   DIAGNOSTIC FINDINGS:  Nothing post op    PATIENT SURVEYS:  LEFS  Extreme difficulty/unable (0), Quite a bit of difficulty (1), Moderate difficulty (2), Little difficulty (3), No difficulty (4) Survey date:  eval 8/28 10/31 12/31- Rt knee 12/31- Lt knee  Any of your usual work, housework or school activities  4  0 3  2. Usual hobbies, recreational or sporting activities  3  0 3  3. Getting into/out of the bath  3  1 3   4. Walking between rooms  3  1 3   5. Putting on socks/shoes  3  1 2   6. Squatting   3  1 2   7. Lifting an object, like a bag of groceries from the floor  2  1 2   8. Performing light  activities around your home  1  1 2   9. Performing heavy activities around your home  2  0 2  10. Getting into/out of a car  3  2 3   11. Walking 2 blocks  1  1 2   12. Walking 1 mile  1  1 2   13. Going up/down 10 stairs (1 flight)  1  1 2   14. Standing for 1 hour  1  0 1  15.  sitting for 1 hour  1  1 1   16. Running on even ground  1  0 0  17. Running on uneven ground  1  0 0  18. Making sharp turns while running fast  1  0 0  19. Hopping   1  0 0  20. Rolling over in bed  2  1 3   Score total:  23/80 38/80 47/80  13/80 34/80      COGNITION: Overall cognitive status: Within functional limits for tasks assessed                               SENSATION:   EDEMA:  Circumferential: 44.5 R  48.7 L    MUSCLE LENGTH:   POSTURE: rounded shoulders and forward head   PALPATION: No unexpected TTP    LOWER EXTREMITY ROM:   Passive ROM Right eval Left eval left left Left  8/11  L  8/18 Left  10/31  Hip flexion           Hip extension           Hip abduction           Hip adduction           Hip internal rotation           Hip external rotation           Knee flexion   96  100 103 118  123 125  Knee extension    -3 -3      Ankle dorsiflexion           Ankle plantarflexion           Ankle inversion           Ankle eversion            (Blank rows = not tested)   LOWER EXTREMITY MMT:   MMT (lb) Right 8/21 Left 8/21 Right  Left  Left  Right  10/31 Left 10/31 Rt/Lt 12/31  Hip flexion     4 4+ 4+ 27.0 27.5   Hip extension            Hip abduction     4 4+ 4+ 32.9 33.9 26.3/18.1  Hip adduction            Hip internal rotation            Hip external rotation            Knee flexion  35.3 38.8       31.0/34.5  Knee extension (age norm 60lb)   46.0 46.1 46.0 ( from previous visit)  46.1 ( from previous visit)   32.7 47.1 46.2/57.4  Ankle dorsiflexion            Ankle plantarflexion            Ankle inversion            Ankle eversion             (Blank rows = not  tested)       GAIT: Decreased left hip flexion Forward trunk flexion  Decreased left single leg                                                                                                                                    TREATMENT DATE:   [facility] Adult PT   [facility] Adult PT Treatment:                                                DATE: 09/30/24 Pt seen for aquatic therapy today.  Treatment took place in water 3.5-4.75 ft in depth at the [facility] pool. Temp of water was 91.  Pt entered/exited the pool via stairs using step to pattern with hand rail.   *walking forward, back and side stepping in 3.6 ft with unsupported *side stepping-lunge with yellow HB add/abd *backward and forward march with kick *HS/gastroc, IT band and adductor stretching using noodle (main focus IT band) *Foot stomp leaning against wall LLE 2 x 10 *cycling on noodle   Treatment:    1/23 Manual:  Trigger point release to QL Trigger point release to gluteal  Trigger point release to lower lumbar spine  There-ex:  LTR 3x10  SAQ 3x10 2lbs bilateral  LAQ 2lbs 3x10 right only  Hip abduction 3x10 bilateral    [facility] Adult PT Treatment:                                                DATE: 09/21/24 Pt seen for aquatic therapy today.  Treatment took place in water 3.5-4.75 ft in depth at  the [facility] pool. Temp of water was 91.  Pt entered/exited the pool via stairs using step to pattern with hand rail.   *walking forward, back and side stepping in 3.6 ft with unsupported * UE On yellow hand floats:  heel/toe raises;  hip abdct/ add x 15 each LE * squats pushing single yellow hand float under water 2x10 * STS from bench in water to blue water step x 10, eccentric lowering to step * L/R fig 4 stretch x 15sec x 2 each LE * tandem gait forward/ backward without UE support  *resisted left hip abd and extension using ride band 2 x 10 *L/R forward step ups with light UE support on rails  x 10 each *cycling on noodle  [facility] Adult PT Treatment:                                                DATE: 09/16/24 Pt seen for aquatic therapy today.  Treatment took place in water 3.5-4.75 ft in depth at the [facility] pool. Temp of water was 91.  Pt entered/exited the pool via stairs using step to pattern with hand rail.   *walking forward, back and side stepping in 3.6 ft with unsupported *step ups bottom step leading left x 10 then right ue support hand rails x 7 *calf stretch bottom step bil *resisted left hip abd and extension using ride band 2 x 10 *resisted clamshell in sitting as above 2 x 10 seated in step *hip hiking 2 x 10 left holding x 5 s *side stepping using rider band for resistance R/L *cycling on noodle  Pt requires the buoyancy and hydrostatic pressure of water for support, and to offload joints by unweighting joint load by at least 50 % in navel deep water and by at least 75-80% in chest to neck deep water.  Viscosity of the water is needed for resistance of strengthening. Water current perturbations provides challenge to standing balance requiring increased core activation.       1/13 Nu step L4 x72min (LE's only) Long sit HSS 30sec x3 Standing calf stretch 20sec x2ea LAQ 2x10ea Supine SLR 2x10bil S/l hip abduction 2x10bil Step ups 2x10 4L only, 6 x5 HEP update and review    1/9  Manual:  Trigger point release to QL Trigger point release to gluteal  Trigger point release to lower lumbar spine  There-ex:  LTR 3x10  SAQ 3x10 2lbs LAQ 2lbs 3x10  Hip abduction 3x10   There-act Step up-hold left leg 3x12 4inch All with cuing for posture and core breathing.      PATIENT EDUCATION:  Education details:  reacquainting to aquatic therapy Person educated: Patient Education method: Explanation, Demonstration, Tactile cues, Verbal cues Education comprehension: verbalized understanding, returned demonstration, verbal cues required, tactile  cues required, and needs further education   HOME EXERCISE PROGRAM: Access Code: [email or web address] URL: [email or web address] Date: 03/22/2024 Prepared by: Harlene Cordon  Exercises - Seated Hamstring Stretch  - 2-3 x daily - 7 x weekly - 2 sets - 5 breaths hold - Seated Heel Slide  - 5 x daily - 7 x weekly - 1 sets - 10 reps - Standing Gluteal Sets  - 5 x daily - 7 x weekly - 1 sets - 10 reps - Standing Weight Shift Side to Side Eyes Closed  - 5 x daily - 7  x weekly - 1 sets - 10 reps - Mini Squat  - 5 x daily - 7 x weekly - 1 sets - 10 reps - Sidelying Hip Abduction  - 3 x daily - 7 x weekly - 3 sets - 10 reps    Access Code: [facility] URL: [email or web address] Date: 09/30/2024 Prepared by: Frankie Oaklynn Stierwalt  Exercises - Supine ITB Stretch  - 1 x daily - 7 x weekly - 3 sets - 10 reps - Roller Massage Elongated IT Band Release  - 1 x daily - 7 x weekly - 3 sets - 10 reps ASSESSMENT:   CLINICAL IMPRESSION: IT band on Lt tight and area likely spasming at night. She responded well to IT band stretching. Pt edu on hydration.  IT band stretching and roller massage added to HEP.  She is provided vc and demonstration on proper completion of exercises today.  Pt reports complete reduction of tightness of Lt knee/LE by end of session. Goals ongoing.      OBJECTIVE: Abnormal gait, decreased activity tolerance, decreased mobility, difficulty walking, decreased ROM, decreased strength, increased edema, increased muscle spasms, and pain.    ACTIVITY LIMITATIONS: carrying, lifting, bending, standing, squatting, stairs, transfers, bed mobility, bathing, and locomotion level   PARTICIPATION LIMITATIONS: meal prep, cleaning, laundry, driving, shopping, community activity, occupation, and yard work   PERSONAL FACTORS: 1-2 comorbidities: hip OA  are also affecting patient's functional outcome.    REHAB POTENTIAL: Good   CLINICAL DECISION MAKING: Stable/uncomplicated    EVALUATION COMPLEXITY: Low     GOALS: Goals reviewed with patient? Yes  04/13/2024     SHORT TERM GOALS: Target date:  Patient will increase passive knee flexion to 128 degrees on the left Baseline: Goal status: achieved 10/31  2.  Patient will increase passive left knee extension to full Baseline:  Goal status: Achieved 9/11 3.  Patient will progress off assistive device and ambulate greater than 500 feet without significant antalgic gait Baseline:  Goal status: uses cane for long distances-  09/21/24   4.  Patient will be independent with HEP Baseline:  Goal status: achieved 10/31     LONG TERM GOALS: Target date: POC     Patient will go up and down 12 steps with reciprocal gait pattern without increased pain Baseline:  Goal status: ongoing- does ok but is uncomfortable    2.  Patient will squat pick items up off the ground without increased pain Baseline:  Goal status: improving, significant limitation by Rt knee, lacking necessary strength to fully do the motion   3.  Patient will ambulate community distances without pain Baseline:  Goal status: Lt knee is great, everything else makes it difficult    4.  Patient will have complete HEP to promote strengthening Baseline:  Goal status: requires further progression and management of Rt knee and left hip.       PLAN:   PT FREQUENCY: 1-22x/week   PT DURATION: POC date   PLANNED INTERVENTIONS: 97110-Therapeutic exercises, 97530- Therapeutic activity, 97112- Neuromuscular re-education, [phone]- Self Care, 02859- Manual therapy, [phone]- Gait training, [phone]- Aquatic Therapy, 97014- , [phone]- Ultrasound, Patient/Family education, Stair training, Dry Needling, DME instructions, Cryotherapy, and Moist heat    PLAN FOR NEXT SESSION:  Prehab for Rt knee surgery 11/15/24, plan to d/c at 2/27 end date and utilize 2 week [facility].    [place]) Ryelle Ruvalcaba MPT 09/30/24 1:44 PM [facility] [address] [place], [place], [address] Phone: [phone]  Fax:  [phone]         Referring diagnosis? Left TKA  Treatment diagnosis? (if different than referring diagnosis) LEft knee stiffness  Left knee pain  What was this (referring dx) caused by? [x]  Surgery []  Fall []  Ongoing issue []  Arthritis []  Other: ____________  Laterality: []  Rt [x]  Lt []  Both  Check all possible CPT codes:  *CHOOSE 10 OR LESS*    See Planned Interventions listed in the Plan section of the Evaluation.

## 2024-10-06 ENCOUNTER — Ambulatory Visit (HOSPITAL_BASED_OUTPATIENT_CLINIC_OR_DEPARTMENT_OTHER): Admitting: Physical Therapy

## 2024-10-11 ENCOUNTER — Ambulatory Visit (HOSPITAL_BASED_OUTPATIENT_CLINIC_OR_DEPARTMENT_OTHER): Attending: Student | Admitting: Physical Therapy

## 2024-10-20 ENCOUNTER — Ambulatory Visit (HOSPITAL_BASED_OUTPATIENT_CLINIC_OR_DEPARTMENT_OTHER): Admitting: Physical Therapy

## 2024-10-27 ENCOUNTER — Ambulatory Visit (HOSPITAL_BASED_OUTPATIENT_CLINIC_OR_DEPARTMENT_OTHER): Admitting: Physical Therapy

## 2024-11-29 ENCOUNTER — Ambulatory Visit (HOSPITAL_BASED_OUTPATIENT_CLINIC_OR_DEPARTMENT_OTHER): Admitting: Physical Therapy

## 2024-12-02 ENCOUNTER — Encounter (HOSPITAL_BASED_OUTPATIENT_CLINIC_OR_DEPARTMENT_OTHER): Admitting: Physical Therapy

## 2024-12-06 ENCOUNTER — Encounter (HOSPITAL_BASED_OUTPATIENT_CLINIC_OR_DEPARTMENT_OTHER)

## 2024-12-09 ENCOUNTER — Encounter (HOSPITAL_BASED_OUTPATIENT_CLINIC_OR_DEPARTMENT_OTHER)

## 2024-12-13 ENCOUNTER — Encounter (HOSPITAL_BASED_OUTPATIENT_CLINIC_OR_DEPARTMENT_OTHER)

## 2024-12-16 ENCOUNTER — Encounter (HOSPITAL_BASED_OUTPATIENT_CLINIC_OR_DEPARTMENT_OTHER): Admitting: Physical Therapy

## 2024-12-20 ENCOUNTER — Encounter (HOSPITAL_BASED_OUTPATIENT_CLINIC_OR_DEPARTMENT_OTHER)

## 2024-12-23 ENCOUNTER — Encounter (HOSPITAL_BASED_OUTPATIENT_CLINIC_OR_DEPARTMENT_OTHER): Admitting: Physical Therapy

## 2024-12-27 ENCOUNTER — Encounter (HOSPITAL_BASED_OUTPATIENT_CLINIC_OR_DEPARTMENT_OTHER)

## 2024-12-30 ENCOUNTER — Encounter (HOSPITAL_BASED_OUTPATIENT_CLINIC_OR_DEPARTMENT_OTHER): Admitting: Physical Therapy

## 2025-01-03 ENCOUNTER — Encounter (HOSPITAL_BASED_OUTPATIENT_CLINIC_OR_DEPARTMENT_OTHER)

## 2025-01-06 ENCOUNTER — Encounter (HOSPITAL_BASED_OUTPATIENT_CLINIC_OR_DEPARTMENT_OTHER): Admitting: Physical Therapy

## 2025-01-10 ENCOUNTER — Encounter (HOSPITAL_BASED_OUTPATIENT_CLINIC_OR_DEPARTMENT_OTHER)

## 2025-01-13 ENCOUNTER — Encounter (HOSPITAL_BASED_OUTPATIENT_CLINIC_OR_DEPARTMENT_OTHER): Admitting: Physical Therapy

## 2025-01-17 ENCOUNTER — Encounter (HOSPITAL_BASED_OUTPATIENT_CLINIC_OR_DEPARTMENT_OTHER)

## 2025-01-20 ENCOUNTER — Encounter (HOSPITAL_BASED_OUTPATIENT_CLINIC_OR_DEPARTMENT_OTHER): Admitting: Physical Therapy
